# Patient Record
Sex: Male | Born: 1945
Health system: Southern US, Community
[De-identification: ages and names within clinical notes are randomized; demographics above are authoritative.]

## PROBLEM LIST (undated history)

## (undated) DIAGNOSIS — G4730 Sleep apnea, unspecified: Secondary | ICD-10-CM

## (undated) DIAGNOSIS — G40909 Epilepsy, unspecified, not intractable, without status epilepticus: Secondary | ICD-10-CM

## (undated) DIAGNOSIS — M25569 Pain in unspecified knee: Secondary | ICD-10-CM

## (undated) DIAGNOSIS — I10 Essential (primary) hypertension: Secondary | ICD-10-CM

## (undated) DIAGNOSIS — E7800 Pure hypercholesterolemia, unspecified: Secondary | ICD-10-CM

## (undated) DIAGNOSIS — I2510 Atherosclerotic heart disease of native coronary artery without angina pectoris: Secondary | ICD-10-CM

## (undated) DIAGNOSIS — M489 Spondylopathy, unspecified: Secondary | ICD-10-CM

## (undated) DIAGNOSIS — R55 Syncope and collapse: Secondary | ICD-10-CM

## (undated) HISTORY — DX: Pain in unspecified knee: M25.569

## (undated) HISTORY — DX: Spondylopathy, unspecified: M48.9

## (undated) HISTORY — DX: Pure hypercholesterolemia, unspecified: E78.00

## (undated) HISTORY — PX: SHOULDER ARTHROSCOPY: SHX128

## (undated) HISTORY — DX: Syncope and collapse: R55

## (undated) HISTORY — PX: HERNIA REPAIR: SHX51

## (undated) HISTORY — PX: COLONOSCOPY: SHX174

## (undated) HISTORY — DX: Essential (primary) hypertension: I10

## (undated) HISTORY — PX: OTHER SURGICAL HISTORY: SHX169

## (undated) HISTORY — DX: Epilepsy, unspecified, not intractable, without status epilepticus: G40.909

## (undated) HISTORY — PX: VASECTOMY: SHX75

## (undated) HISTORY — DX: Sleep apnea, unspecified: G47.30

## (undated) HISTORY — DX: Atherosclerotic heart disease of native coronary artery without angina pectoris: I25.10

---

## 1997-11-24 ENCOUNTER — Ambulatory Visit (HOSPITAL_COMMUNITY): Admission: RE | Admit: 1997-11-24 | Discharge: 1997-11-24 | Payer: Self-pay | Admitting: Neurosurgery

## 1997-11-24 ENCOUNTER — Encounter: Payer: Self-pay | Admitting: Neurosurgery

## 1999-03-13 ENCOUNTER — Other Ambulatory Visit: Admission: RE | Admit: 1999-03-13 | Discharge: 1999-03-13 | Payer: Self-pay | Admitting: Urology

## 1999-12-02 ENCOUNTER — Other Ambulatory Visit: Admission: RE | Admit: 1999-12-02 | Discharge: 1999-12-02 | Payer: Self-pay | Admitting: Urology

## 1999-12-14 ENCOUNTER — Other Ambulatory Visit: Admission: RE | Admit: 1999-12-14 | Discharge: 1999-12-14 | Payer: Self-pay | Admitting: Urology

## 2000-01-16 ENCOUNTER — Other Ambulatory Visit: Admission: RE | Admit: 2000-01-16 | Discharge: 2000-01-16 | Payer: Self-pay | Admitting: Urology

## 2000-02-19 ENCOUNTER — Other Ambulatory Visit: Admission: RE | Admit: 2000-02-19 | Discharge: 2000-02-19 | Payer: Self-pay | Admitting: Urology

## 2000-06-11 ENCOUNTER — Other Ambulatory Visit: Admission: RE | Admit: 2000-06-11 | Discharge: 2000-06-11 | Payer: Self-pay | Admitting: Urology

## 2000-12-07 ENCOUNTER — Other Ambulatory Visit: Admission: RE | Admit: 2000-12-07 | Discharge: 2000-12-07 | Payer: Self-pay | Admitting: Urology

## 2001-05-04 ENCOUNTER — Other Ambulatory Visit: Admission: RE | Admit: 2001-05-04 | Discharge: 2001-05-04 | Payer: Self-pay | Admitting: Urology

## 2001-10-08 ENCOUNTER — Encounter: Payer: Self-pay | Admitting: Internal Medicine

## 2001-11-09 ENCOUNTER — Encounter: Payer: Self-pay | Admitting: Internal Medicine

## 2001-11-16 ENCOUNTER — Other Ambulatory Visit: Admission: RE | Admit: 2001-11-16 | Discharge: 2001-11-16 | Payer: Self-pay | Admitting: Urology

## 2003-06-15 ENCOUNTER — Encounter: Admission: RE | Admit: 2003-06-15 | Discharge: 2003-06-15 | Payer: Self-pay | Admitting: Specialist

## 2003-07-04 ENCOUNTER — Encounter: Admission: RE | Admit: 2003-07-04 | Discharge: 2003-07-04 | Payer: Self-pay | Admitting: Specialist

## 2004-11-08 ENCOUNTER — Ambulatory Visit: Payer: Self-pay | Admitting: Internal Medicine

## 2004-11-22 ENCOUNTER — Ambulatory Visit: Payer: Self-pay | Admitting: Internal Medicine

## 2004-11-22 ENCOUNTER — Encounter (INDEPENDENT_AMBULATORY_CARE_PROVIDER_SITE_OTHER): Payer: Self-pay | Admitting: Specialist

## 2005-04-10 ENCOUNTER — Encounter: Admission: RE | Admit: 2005-04-10 | Discharge: 2005-04-10 | Payer: Self-pay | Admitting: Cardiovascular Disease

## 2005-04-15 ENCOUNTER — Ambulatory Visit (HOSPITAL_COMMUNITY): Admission: RE | Admit: 2005-04-15 | Discharge: 2005-04-16 | Payer: Self-pay | Admitting: Cardiovascular Disease

## 2005-05-09 ENCOUNTER — Ambulatory Visit (HOSPITAL_COMMUNITY): Admission: RE | Admit: 2005-05-09 | Discharge: 2005-05-09 | Payer: Self-pay | Admitting: Internal Medicine

## 2006-04-11 ENCOUNTER — Inpatient Hospital Stay (HOSPITAL_COMMUNITY): Admission: EM | Admit: 2006-04-11 | Discharge: 2006-04-14 | Payer: Self-pay | Admitting: Emergency Medicine

## 2006-04-17 ENCOUNTER — Inpatient Hospital Stay (HOSPITAL_COMMUNITY): Admission: EM | Admit: 2006-04-17 | Discharge: 2006-04-18 | Payer: Self-pay | Admitting: Emergency Medicine

## 2006-04-17 ENCOUNTER — Ambulatory Visit: Payer: Self-pay | Admitting: Vascular Surgery

## 2007-08-16 ENCOUNTER — Inpatient Hospital Stay (HOSPITAL_COMMUNITY): Admission: EM | Admit: 2007-08-16 | Discharge: 2007-08-17 | Payer: Self-pay | Admitting: Emergency Medicine

## 2009-06-19 ENCOUNTER — Encounter: Admission: RE | Admit: 2009-06-19 | Discharge: 2009-06-19 | Payer: Self-pay | Admitting: Neurosurgery

## 2009-07-19 ENCOUNTER — Encounter: Admission: RE | Admit: 2009-07-19 | Discharge: 2009-07-19 | Payer: Self-pay | Admitting: Orthopaedic Surgery

## 2009-10-08 ENCOUNTER — Encounter: Payer: Self-pay | Admitting: Internal Medicine

## 2009-10-15 ENCOUNTER — Encounter (INDEPENDENT_AMBULATORY_CARE_PROVIDER_SITE_OTHER): Payer: Self-pay | Admitting: *Deleted

## 2009-11-07 ENCOUNTER — Ambulatory Visit: Payer: Self-pay | Admitting: Cardiology

## 2009-11-26 ENCOUNTER — Ambulatory Visit: Payer: Self-pay | Admitting: Cardiology

## 2009-11-28 ENCOUNTER — Ambulatory Visit: Payer: Self-pay | Admitting: Internal Medicine

## 2009-11-28 DIAGNOSIS — Z860100 Personal history of colon polyps, unspecified: Secondary | ICD-10-CM | POA: Insufficient documentation

## 2009-11-30 ENCOUNTER — Encounter: Payer: Self-pay | Admitting: Internal Medicine

## 2009-12-12 ENCOUNTER — Ambulatory Visit: Payer: Self-pay | Admitting: Internal Medicine

## 2009-12-18 ENCOUNTER — Encounter: Payer: Self-pay | Admitting: Internal Medicine

## 2010-04-16 NOTE — Letter (Signed)
Summary: Anticoagulation/[facility]  Anticoagulation/[facility]   Imported By: Sherian Rein 12/04/2009 08:02:34  _____________________________________________________________________  External Attachment:    Type:   Image     Comment:   External Document

## 2010-04-16 NOTE — Procedures (Signed)
Summary: Colonoscopy  Patient: [name] Note: All result statuses are Final unless otherwise noted.  Tests: (1) Colonoscopy (COL)   COL Colonoscopy           DONE     [facility]     [address]     [place], [place]  [address]           COLONOSCOPY PROCEDURE REPORT           PATIENT:  Dustin, Ford  MR#:  [id number]     BIRTHDATE:  [birth year], 64 yrs. old  GENDER:  male     ENDOSCOPIST:  Wilhemina Bonito. Eda Keys, MD     REF. BY:  Surveillance Program Recall     PROCEDURE DATE:  12/12/2009     PROCEDURE:  Colonoscopy with snare polypectomy x 2     ASA CLASS:  Class II     INDICATIONS:  history of pre-cancerous (adenomatous) colon polyps,     surveillance and high-risk screening ; index 2003 w/ f/u 2006,     each w/ small adenomas     MEDICATIONS:   Fentanyl 75 mcg IV, Versed 9 mg IV           DESCRIPTION OF PROCEDURE:   After the risks benefits and     alternatives of the procedure were thoroughly explained, informed     consent was obtained.  Digital rectal exam was performed and     revealed no abnormalities.   The LB CF-[id number] endoscope     was introduced through the anus and advanced to the cecum, which     was identified by both the appendix and ileocecal valve, without     limitations.Time to cecum =4:11 min.  The quality of the prep was     excellent, using MoviPrep.  The instrument was then slowly     withdrawn (time = 13:32 min) as the colon was fully examined.     <<PROCEDUREIMAGES>>           FINDINGS:  A 5mm sessile polyp was found in the cecum. Polyp was     snared without cautery. Retrieval was successful.   A 6mm     depressed sessile polyp was found in the mid transverse colon.     Polyp was snared without cautery. Retrieval was successful.   Mild     diverticulosis was found in the sigmoid colon.   Retroflexed views     in the rectum revealed internal hemorrhoids.    The scope was then     withdrawn from the patient and the procedure completed.           COMPLICATIONS:  None     ENDOSCOPIC IMPRESSION:     1) Sessile polyp in the cecum - removed     2) Sessile polyp in the mid transverse colon - removed     3) Mild diverticulosis in the sigmoid colon     4) Internal hemorrhoids           RECOMMENDATIONS:     1) Follow up colonoscopy in 3 or  5 years pending pathology     results     2) Resume Plavix today           ______________________________     Wilhemina Bonito. Eda Keys, MD           CC:  Geoffry Paradise, MD; The Patient  n.     eSIGNED:   Wilhemina Bonito. Eda Keys at 12/12/2009 12:50 PM           Derrill Kay, [id number]  Note: An exclamation mark (!) indicates a result that was not dispersed into the flowsheet. Document Creation Date: 12/12/2009 12:51 PM _______________________________________________________________________  (1) Order result status: Final Collection or observation date-time: 12/12/2009 12:36 Requested date-time:  Receipt date-time:  Reported date-time:  Referring Physician:   Ordering Physician: Fransico Setters [phone]) Specimen Source:  Source: Launa Grill Order Number: [id number] Lab site:   Appended Document: Colonoscopy     Procedures Next Due Date:    Colonoscopy: 11/2014

## 2010-04-16 NOTE — Letter (Signed)
Summary: Anticoagulation Modification Letter  [facility]  [address] [place], [place] [address]   Phone: [phone]  Fax: [phone]        November 28, 2009  Re:    [name] [facility] DOB:    10-Dec-1945 MRN:    [id number]    Dear Peter Swaziland:  We have scheduled the above patient for a Colonoscopy procedure. Our records show that  he is on anticoagulation therapy. Please advise as to how long the patient may come off their therapy of Plavix prior to the scheduled procedure(s) on 12/12/09.   Please fax back/or route the completed form to Milford Cage, CMA at [phone].  Thank you for your help with this matter.  Sincerely,    Wilhemina Bonito. Marina Goodell, MD   Physician Recommendation:  Hold Plavix 5 days prior ________________  Stay on Aspirin   Other ______________________________

## 2010-04-16 NOTE — Assessment & Plan Note (Signed)
Summary: Surveillance colonoscopy/on Plavix   History of Present Illness Visit Type: new patient  Primary GI MD: Yancey Flemings MD Primary Provider: Minda Meo, MD  Requesting Provider: na Chief Complaint: Consult colon. Pt denies any GI complaints. Pt is on Plavix  History of Present Illness:   65 year old white male with hypertension, hyperlipidemia, seizure disorder, sleep apnea, and coronary artery disease status post coronary artery stent placement. He is followed in this office for history of adenomatous colon polyps and is now due for surveillance colonoscopy. Since his last visit he reports having had for coronary artery stents placed, most recently about 3 years ago. He has been on aspirin and Plavix since the initial stent placement. He recently saw his cardiologist, Dr. Swaziland, who suggested that may be safe to come off Plavix for short period of time while being maintained on aspirin for his procedure work. Patient has no active GI symptoms to report. His index colonoscopy was performed at 2003 with followup in September of 2006. Each time with small adenomas and mild diverticulosis.   GI Review of Systems      Denies abdominal pain, acid reflux, belching, bloating, chest pain, dysphagia with liquids, dysphagia with solids, heartburn, loss of appetite, nausea, vomiting, vomiting blood, weight loss, and  weight gain.        Denies anal fissure, black tarry stools, change in bowel habit, constipation, diarrhea, diverticulosis, fecal incontinence, heme positive stool, hemorrhoids, irritable bowel syndrome, jaundice, light color stool, liver problems, rectal bleeding, and  rectal pain.    Current Medications (verified): 1)  Tegretol 200 Mg Tabs (Carbamazepine) .... 5 By Mouth Once Daily 2)  Valtrex 500 Mg Tabs (Valacyclovir Hcl) .... 1/2 Tablet By Mouth Once Daily 3)  Aspirin 325 Mg Tabs (Aspirin) .Marland Kitchen.. 1 By Mouth Once Daily 4)  Lisinopril 40 Mg Tabs (Lisinopril) .... One Tablet By  Mouth Once Daily 5)  Plavix 75 Mg Tabs (Clopidogrel Bisulfate) .... One Tablet By Mouth Once Daily 6)  Metoprolol Succinate 25 Mg Xr24h-Tab (Metoprolol Succinate) .... One Tablet By Mouth Once Daily 7)  Amlodipine Besylate 5 Mg Tabs (Amlodipine Besylate) .... One Tablet By Mouth Once Daily 8)  Simvastatin 40 Mg Tabs (Simvastatin) .... One Every Other Day  Allergies (verified): No Known Drug Allergies  Past History:  Past Medical History: Adenomatous Colon Polyps Diverticulosis Seizures Hyperlipidemia Hemorrhoids Coronary Artery Disease Hypertension Sleep Apnea  Past Surgical History: Cyst Removal underarm vasectomy Hernia Surgery Angioplasty/Stent-- 2007 and 2008   Family History: No FH of Colon Cancer: Family History of Heart Disease: Father Hodgkins Lymphoma-Brother   Social History: Occupation: Retired Married Childern  Patient is a former smoker. --Quit 40 yrs ago  Alcohol Use - yes: 3-5 monthly  Daily Caffeine Use: 2 daily  Illicit Drug Use - no Smoking Status:  quit Drug Use:  no  Review of Systems       The patient complains of allergy/sinus.  The patient denies anemia, anxiety-new, arthritis/joint pain, back pain, blood in urine, breast changes/lumps, change in vision, confusion, cough, coughing up blood, depression-new, fainting, fatigue, fever, headaches-new, hearing problems, heart murmur, heart rhythm changes, itching, muscle pains/cramps, night sweats, nosebleeds, shortness of breath, skin rash, sleeping problems, sore throat, swelling of feet/legs, swollen lymph glands, thirst - excessive, urination - excessive, urination changes/pain, urine leakage, vision changes, and voice change.    Vital Signs:  Patient profile:   65 year old male Height:      73 inches Weight:  192 pounds BMI:     25.42 BSA:     2.12 Pulse rate:   64 / minute Pulse rhythm:   regular BP sitting:   124 / 80  (left arm) Cuff size:   regular  Vitals Entered By: Ok Anis CMA (November 28, 2009 9:20 AM)  Physical Exam  General:  Well developed, well nourished, no acute distress. Head:  Normocephalic and atraumatic. Eyes:  PERRLA, no icterus. Ears:  Normal auditory acuity. Nose:  No deformity, discharge,  or lesions. Mouth:  No deformity or lesions, dentition normal. Neck:  Supple; no masses or thyromegaly. Lungs:  Clear throughout to auscultation. Heart:  Regular rate and rhythm; no murmurs, rubs,  or bruits. Abdomen:  Soft, nontender and nondistended. No masses, hepatosplenomegaly or hernias noted. Normal bowel sounds. Rectal:  deferred until colonoscopy Msk:  Symmetrical with no gross deformities. Normal posture. Pulses:  Normal pulses noted. Extremities:  No , edema or deformities noted. Neurologic:  Alert and  oriented x4;. Skin:  Intact without significant lesions or rashes. Psych:  Alert and cooperative. Normal mood and affect.   Impression & Recommendations:  Problem # 1:  PERSONAL HISTORY OF COLONIC POLYPS (ICD-V27.52) 65 year old with multiple medical problems and history of adenomatous colon polyps who presents today regarding surveillance colonoscopy. His multiple medical problems are stable. He is high risk however due to his medical problems and the need to address Plavix therapy. We discussed in some detail today the advantages and disadvantages of staying on Plavix versus commoning off Plavix for a short period. I offered him both options with possible limitations discussed. He was interested in coming off Plavix, if okay with his cardiologist.  Plan: #1. Colonoscopy. The nature of the procedure as well as the risks, benefits, and alternatives were reviewed. He understood and agreed to proceed #2. Movi prep prescribed. The patient instructed on its use #3. Hold Plavix 5 days prior to the procedure while staying on aspirin. This is okayed with his cardiologist, Dr. Peter Swaziland. A letter in this regard has been faxed to Dr. Swaziland  or his review and approval. We will confirm his response with the patient  Problem # 2:  CAD (ICD-414.00) stable on aspirin and Plavix for prior intracoronary stents. As it relates to the procedure work, see discussion above  Other Orders: Colonoscopy (Colon)  Patient Instructions: 1)  Colon [facility] 12/12/09 11:00 am arrive at 10:00 am 2)  Movi prep instructions given to patient and Rx. sent to pharmacy. 3)  Colonoscopy and Flexible Sigmoidoscopy brochure given.  4)  Hold Plavix 5 days prior and letter sent to Dr. Swaziland for approval. 5)  Copy sent to : Minda Meo, MD  Peter Swaziland, MD 6)  The medication list was reviewed and reconciled.  All changed / newly prescribed medications were explained.  A complete medication list was provided to the patient / caregiver. Prescriptions: MOVIPREP 100 GM  SOLR (PEG-KCL-NACL-NASULF-NA ASC-C) As per prep instructions.  #1 x 0   Entered by:   Milford Cage NCMA   Authorized by:   Hilarie Fredrickson MD   Signed by:   Milford Cage NCMA on 11/28/2009   Method used:   Electronically to        [facility] [address] [phone]* (retail)       [address]       [place], [place]  [address]       Ph: [phone] or  [phone]       Fax: [phone]   RxID:   [id number]

## 2010-04-16 NOTE — Procedures (Signed)
Summary: Colonoscopy   Colonoscopy  Procedure date:  11/22/2004  Findings:      Location:  [facility].  Results: Diverticulosis.       Pathology:  Adenomatous polyp.          Procedures Next Due Date:    Colonoscopy: 11/2009 Patient Name: [name], [name]. MRN:  Procedure Procedures: Colonoscopy CPT: [phone].    with polypectomy. CPT: [id number].  Personnel: Endoscopist: Wilhemina Bonito. Marina Goodell, MD.  Exam Location: Exam performed in Outpatient Clinic. Outpatient  Patient Consent: Procedure, Alternatives, Risks and Benefits discussed, consent obtained, from patient. Consent was obtained by the RN.  Indications  Surveillance of: Adenomatous Polyp(s). This is an initial surveillance exam. Initial polypectomy was performed in 2003. in Jul. 1-2 Polyps were found at Index Exam. Largest polyp removed was 1 to 5 mm. Prior polyp located in distal colon. Pathology of worst  polyp: tubular adenoma.  History  Current Medications: Patient is not currently taking Coumadin.  Pre-Exam Physical: Performed Nov 22, 2004. Entire physical exam was normal.  Exam Exam: Extent of exam reached: Cecum, extent intended: Cecum.  The cecum was identified by appendiceal orifice and IC valve. Patient position: on left side. Colon retroflexion performed. Images taken. ASA Classification: II. Tolerance: excellent.  Monitoring: Pulse and BP monitoring, Oximetry used. Supplemental O2 given.  Colon Prep Used Miralax for colon prep. Prep results: excellent.  Sedation Meds: Patient assessed and found to be appropriate for moderate (conscious) sedation. Fentanyl 75 mcg. given IV. Versed 7 mg. given IV.  Findings POLYP: Descending Colon, Maximum size: 2 mm. sessile polyp. Procedure:  snare without cautery, removed, retrieved, Polyp sent to pathology. ICD9: Colon Polyps: 211.3.  NORMAL EXAM: Cecum to Rectum.  - DIVERTICULOSIS: Sigmoid Colon. ICD9: Diverticulosis, Colon: 562.10. Comments: mild.  POLYP:  Sigmoid Colon, Maximum size: 2 mm. sessile polyp. Procedure:  snare without cautery, removed, retrieved, sent to pathology. ICD9: Colon Polyps: 211.3.   Assessment Abnormal examination, see findings above.  Diagnoses: 211.3: Colon Polyps.  562.10: Diverticulosis, Colon.   Events  Unplanned Interventions: No intervention was required.  Unplanned Events: There were no complications. Plans Disposition: After procedure patient sent to recovery. After recovery patient sent home.  Scheduling/Referral: Colonoscopy, to Wilhemina Bonito. Marina Goodell, MD, in 5 years,    This report was created from the original endoscopy report, which was reviewed and signed by the above listed endoscopist.   cc:  Geoffry Paradise, MD      The Patient

## 2010-04-16 NOTE — Procedures (Signed)
Summary: Colonoscopy/[facility]  Colonoscopy/[facility]   Imported By: Sherian Rein 12/12/2009 15:19:07  _____________________________________________________________________  External Attachment:    Type:   Image     Comment:   External Document

## 2010-04-16 NOTE — Letter (Signed)
Summary: Patient Notice- Polyp Results  [name]  [address] [place], [place] [address]   Phone: [phone]  Fax: [phone]        December 18, 2009 MRN: [id number]    [name] [address] [place], [place]  [address]    Dear Mr. Sonoda,  I am pleased to inform you that the colon polyp(s) removed during your recent colonoscopy was (were) found to be benign (no cancer detected) upon pathologic examination.  I recommend you have a repeat colonoscopy examination in 5 years to look for recurrent polyps, as having colon polyps increases your risk for having recurrent polyps or even colon cancer in the future.  Should you develop new or worsening symptoms of abdominal pain, bowel habit changes or bleeding from the rectum or bowels, please schedule an evaluation with either your primary care physician or with me.  Additional information/recommendations:  __ No further action with Ford is needed at this time. Please      follow-up with your primary care physician for your other healthcare      needs.    Please call us if you are having persistent problems or have questions about your condition that have not been fully answered at this time.  Sincerely,  Hilarie Fredrickson MD  This letter has been electronically signed by your physician.  Appended Document: Patient Notice- Polyp Results letter mailed

## 2010-04-16 NOTE — Letter (Signed)
Summary: [facility] Instructions  [facility]  [address] [place], [place] [address]   Phone: [phone]  Fax: [phone]       [name]    [birth year]    MRN: [id number]        Procedure Day /Date:WEDNESDAY 12/12/09     Arrival Time:10:00 AM     Procedure Time:11:00 A.M.     Location of Procedure:                    X  [facility] (4th Floor)         PREPARATION FOR COLONOSCOPY WITH MOVIPREP   Starting 5 days prior to your procedure 12/07/09 do not eat nuts, seeds, popcorn, corn, beans, peas,  salads, or any raw vegetables.  Do not take any fiber supplements (e.g. Metamucil, Citrucel, and Benefiber).  THE DAY BEFORE YOUR PROCEDURE         DATE: 12/11/09 DAY: TUESDAY  1.  Drink clear liquids the entire day-NO SOLID FOOD  2.  Do not drink anything colored red or purple.  Avoid juices with pulp.  No orange juice.  3.  Drink at least 64 oz. (8 glasses) of fluid/clear liquids during the day to prevent dehydration and help the prep work efficiently.  CLEAR LIQUIDS INCLUDE: Water Jello Ice Popsicles Tea (sugar ok, no milk/cream) Powdered fruit flavored drinks Coffee (sugar ok, no milk/cream) Gatorade Juice: apple, white grape, white cranberry  Lemonade Clear bullion, consomm, broth Carbonated beverages (any kind) Strained chicken noodle soup Hard Candy                             4.  In the morning, mix first dose of MoviPrep solution:    Empty 1 Pouch A and 1 Pouch B into the disposable container    Add lukewarm drinking water to the top line of the container. Mix to dissolve    Refrigerate (mixed solution should be used within 24 hrs)  5.  Begin drinking the prep at 5:00 p.m. The MoviPrep container is divided by 4 marks.   Every 15 minutes drink the solution down to the next mark (approximately 8 oz) until the full liter is complete.   6.  Follow completed prep with 16 oz of clear liquid of your choice (Nothing red or purple).   Continue to drink clear liquids until bedtime.  7.  Before going to bed, mix second dose of MoviPrep solution:    Empty 1 Pouch A and 1 Pouch B into the disposable container    Add lukewarm drinking water to the top line of the container. Mix to dissolve    Refrigerate  THE DAY OF YOUR PROCEDURE      DATE: 12/12/09 DAY: WEDNESDAY  Beginning at 6:00 a.m. (5 hours before procedure):         1. Every 15 minutes, drink the solution down to the next mark (approx 8 oz) until the full liter is complete.  2. Follow completed prep with 16 oz. of clear liquid of your choice.    3. You may drink clear liquids until 9:00 AM(2 HOURS BEFORE PROCEDURE).   MEDICATION INSTRUCTIONS  Unless otherwise instructed, you should take regular prescription medications with a small sip of water   as early as possible the morning of your procedure.  Diabetic patients - see separate instructions.  Stop taking Plavix  12/07/09  (5 days before procedure).  WE WILL SEND DR. Swaziland A LETTER FOR APPROVAL AND CONTACT YOU WITH RESPONSE.         OTHER INSTRUCTIONS  You will need a responsible adult at least 66 years of age to accompany you and drive you home.   This person must remain in the waiting room during your procedure.  Wear loose fitting clothing that is easily removed.  Leave jewelry and other valuables at home.  However, you may wish to bring a book to read or  an iPod/MP3 player to listen to music as you wait for your procedure to start.  Remove all body piercing jewelry and leave at home.  Total time from sign-in until discharge is approximately 2-3 hours.  You should go home directly after your procedure and rest.  You can resume normal activities the  day after your procedure.  The day of your procedure you should not:   Drive   Make legal decisions   Operate machinery   Drink alcohol   Return to work  You will receive specific instructions about eating, activities and  medications before you leave.    The above instructions have been reviewed and explained to me by   _______________________    I fully understand and can verbalize these instructions _____________________________ Date _________

## 2010-04-16 NOTE — Progress Notes (Signed)
Summary: [facility]   Imported By: Sherian Rein 12/12/2009 15:17:57  _____________________________________________________________________  External Attachment:    Type:   Image     Comment:   External Document

## 2010-04-16 NOTE — Letter (Signed)
Summary: New Patient letter  [facility]  [address] [place], [place] [address]   Phone: [phone]  Fax: [phone]       10/15/2009 MRN: [id number]  [name] [address] [place], [place]  [address]  Dear [name],  Welcome to the Gastroenterology Division at [facility].    You are scheduled to see Dr.  Yancey Flemings on November 28, 2009 at 9:15am on the 3rd floor at [facility], [address]  We ask that you try to arrive at our office 15 minutes prior to your appointment time to allow for check-in.  We would like you to complete the enclosed self-administered evaluation form prior to your visit and bring it with you on the day of your appointment.  We will review it with you.  Also, please bring a complete list of all your medications or, if you prefer, bring the medication bottles and we will list them.  Please bring your insurance card so that we may make a copy of it.  If your insurance requires a referral to see a specialist, please bring your referral form from your primary care physician.  Co-payments are due at the time of your visit and may be paid by cash, check or credit card.     Your office visit will consist of a consult with your physician (includes a physical exam), any laboratory testing he/she may order, scheduling of any necessary diagnostic testing (e.g. x-ray, ultrasound, CT-scan), and scheduling of a procedure (e.g. Endoscopy, Colonoscopy) if required.  Please allow enough time on your schedule to allow for any/all of these possibilities.    If you cannot keep your appointment, please call [phone] to cancel or reschedule prior to your appointment date.  This allows [place] the opportunity to schedule an appointment for another patient in need of care.  If you do not cancel or reschedule by 5 p.m. the business day prior to your appointment date, you will be charged a $50.00 late cancellation/no-show fee.    Thank you for  choosing [facility] for your medical needs.  We appreciate the opportunity to care for you.  Please visit [place] at our website  to learn more about our practice.                     Sincerely,                                                             The Gastroenterology Division

## 2010-04-16 NOTE — Letter (Signed)
Summary: Colonoscopy Letter  [facility]  [address] [place], [place] [address]   Phone: [phone]  Fax: [phone]      October 08, 2009 MRN: [id number]   CARLON CHALOUX [address] [place], [place]  [address]   Dear Mr. Klem,   According to your medical record, it is time for you to schedule a Colonoscopy. The American Cancer Society recommends this procedure as a method to detect early colon cancer. Patients with a family history of colon cancer, or a personal history of colon polyps or inflammatory bowel disease are at increased risk.  This letter has been generated based on the recommendations made at the time of your procedure. If you feel that in your particular situation this may no longer apply, please contact our office.  Please call our office at [phone] to schedule this appointment or to update your records at your earliest convenience.  Thank you for cooperating with [place] to provide you with the very best care possible.   Sincerely,  Wilhemina Bonito. Marina Goodell, M.D.  [facility] Gastroenterology Division [phone]

## 2010-06-14 ENCOUNTER — Other Ambulatory Visit: Payer: Self-pay | Admitting: *Deleted

## 2010-06-14 ENCOUNTER — Telehealth: Payer: Self-pay | Admitting: Cardiology

## 2010-06-14 DIAGNOSIS — I2510 Atherosclerotic heart disease of native coronary artery without angina pectoris: Secondary | ICD-10-CM

## 2010-06-14 DIAGNOSIS — I10 Essential (primary) hypertension: Secondary | ICD-10-CM

## 2010-06-14 MED ORDER — LISINOPRIL 40 MG PO TABS: 40.0000 mg | ORAL_TABLET | Freq: Every day | ORAL | Status: DC

## 2010-06-14 MED ORDER — AMLODIPINE BESYLATE 5 MG PO TABS: 5.0000 mg | ORAL_TABLET | Freq: Every day | ORAL | Status: DC

## 2010-06-14 MED ORDER — CLOPIDOGREL BISULFATE 75 MG PO TABS: 75.0000 mg | ORAL_TABLET | Freq: Every day | ORAL | Status: DC

## 2010-06-14 NOTE — Telephone Encounter (Signed)
PT JUST WANTS ANITA TO KNOW JUST HAD LABS THIS MORNING AT [facility] IF SHE WANTS TO CALL AND GET THEM. PT SAID HE TOLD HER HER WOULD LET HER KNOW WHEN THEY HAD BEEN DONE. IF YOU NEED CHART, LET ME KNOW. NO CALL BACK TO PT NEEDED.

## 2010-06-14 NOTE — Telephone Encounter (Signed)
Called to let us know he had lab at [facility]. Will call to get results

## 2010-06-14 NOTE — Telephone Encounter (Signed)
escribe medication per fax request  

## 2010-07-02 ENCOUNTER — Encounter: Payer: Self-pay | Admitting: Cardiology

## 2010-07-02 DIAGNOSIS — I10 Essential (primary) hypertension: Secondary | ICD-10-CM | POA: Insufficient documentation

## 2010-07-02 DIAGNOSIS — G40909 Epilepsy, unspecified, not intractable, without status epilepticus: Secondary | ICD-10-CM | POA: Insufficient documentation

## 2010-07-02 DIAGNOSIS — E785 Hyperlipidemia, unspecified: Secondary | ICD-10-CM | POA: Insufficient documentation

## 2010-07-02 DIAGNOSIS — I2510 Atherosclerotic heart disease of native coronary artery without angina pectoris: Secondary | ICD-10-CM | POA: Insufficient documentation

## 2010-07-02 DIAGNOSIS — E7800 Pure hypercholesterolemia, unspecified: Secondary | ICD-10-CM | POA: Insufficient documentation

## 2010-07-04 ENCOUNTER — Ambulatory Visit (INDEPENDENT_AMBULATORY_CARE_PROVIDER_SITE_OTHER): Payer: Medicare Other | Admitting: Cardiology

## 2010-07-04 ENCOUNTER — Encounter: Payer: Self-pay | Admitting: Cardiology

## 2010-07-04 DIAGNOSIS — I2510 Atherosclerotic heart disease of native coronary artery without angina pectoris: Secondary | ICD-10-CM

## 2010-07-04 DIAGNOSIS — I10 Essential (primary) hypertension: Secondary | ICD-10-CM

## 2010-07-04 DIAGNOSIS — E7800 Pure hypercholesterolemia, unspecified: Secondary | ICD-10-CM

## 2010-07-04 NOTE — Patient Instructions (Signed)
Continue current medications.  Call if you notice a change in your chest pain symptoms.   Have your cholesterol rechecked 2 months after being on Lipitor.   I will see you back in 6 months.

## 2010-07-05 ENCOUNTER — Telehealth: Payer: Self-pay | Admitting: Cardiology

## 2010-07-05 NOTE — Telephone Encounter (Signed)
PATIENT HAVING A PROCEDURE AND WANTS TO TALK TO ANITA ABOUT HIS COUMADIN.

## 2010-07-05 NOTE — Assessment & Plan Note (Signed)
Blood pressure is well controlled on current medications. 

## 2010-07-05 NOTE — Progress Notes (Signed)
[name] Date of Birth: July 08, 1945   History of Present Illness: [name] is seen for followup today. He continues to have stable anginal symptoms. He does describe a burning or stinging in his mid chest when he is very active. This may involve walking on it treadmill at a high level or carrying weight. It resolves immediately with rest. It has not changed. In fact it is the same symptoms he had prior to his extensive coronary stents. Since his last visit he has been switched from simvastatin to pravastatin and more recently to Lipitor.  Current Outpatient Prescriptions on File Prior to Visit  Medication Sig Dispense Refill  . amLODipine (NORVASC) 5 MG tablet Take 1 tablet (5 mg total) by mouth daily.  90 tablet  3  . aspirin 81 MG tablet Take 81 mg by mouth daily.        Marland Kitchen atorvastatin (LIPITOR) 40 MG tablet Take 40 mg by mouth daily.        . carbamazepine (TEGRETOL) 200 MG tablet Take 200 mg by mouth daily. 5 TABLETS DAILY       . clopidogrel (PLAVIX) 75 MG tablet Take 1 tablet (75 mg total) by mouth daily.  90 tablet  3  . lisinopril (PRINIVIL,ZESTRIL) 40 MG tablet Take 1 tablet (40 mg total) by mouth daily.  90 tablet  3  . metoprolol (LOPRESSOR) 50 MG tablet Take 25 mg by mouth daily.        . valACYclovir (VALTREX) 500 MG tablet Take 500 mg by mouth daily.        . pravastatin (PRAVACHOL) 40 MG tablet Take 60 mg by mouth daily.          No Known Allergies  Past Medical History  Diagnosis Date  . Hypertension   . Coronary artery disease   . Hypercholesterolemia   . Seizure disorder     Past Surgical History  Procedure Date  . Hernia repair   . Shoulder arthroscopy     right    History  Smoking status  . Former Smoker  . Quit date: 07/03/1965  Smokeless tobacco  . Not on file    History  Alcohol Use No    Family History  Problem Relation Age of Onset  . Heart failure Father 69    Review of Systems: The review of systems is positive for chronic  anginal symptoms as noted. He denies any dyspnea.  All other systems were reviewed and are negative.  Physical Exam: BP 140/80  Pulse 60  Ht 6\' 1"  (1.854 m)  Wt 194 lb (87.998 kg)  BMI 25.60 kg/m2 The patient is alert and oriented x 3.  The mood and affect are normal.  The skin is warm and dry.  Color is normal.  The HEENT exam reveals that the sclera are nonicteric.  The mucous membranes are moist.  The carotids are 2+ without bruits.  There is no thyromegaly.  There is no JVD.  The lungs are clear.  The chest wall is non tender.  The heart exam reveals a regular rate with a normal S1 and S2.  There are no murmurs, gallops, or rubs.  The PMI is not displaced.   Abdominal exam reveals good bowel sounds.  There is no guarding or rebound.  There is no hepatosplenomegaly or tenderness.  There are no masses.  Exam of the legs reveal no clubbing, cyanosis, or edema.  The legs are without rashes.  The distal pulses are intact.  Cranial nerves II - XII are intact.  Motor and sensory functions are intact.  The gait is normal.  LABORATORY DATA:   Assessment / Plan:

## 2010-07-05 NOTE — Assessment & Plan Note (Signed)
We discussed that his goal LDL was 70. He has not been able to achieve this on simvastatin or pravastatin. He is currently on Lipitor 40 mg per day and will have followup lab work in 2 months.

## 2010-07-05 NOTE — Assessment & Plan Note (Signed)
Patient has chronic stable angina at high workloads. His last stress Cardiolite study in March of 2011 was normal. I feel that his ongoing symptoms are related to a small jailed diagonal branch. I recommended continuing on his current therapy. If he notes progressive anginal symptoms this would warrant cardiac catheterization.

## 2010-07-05 NOTE — Telephone Encounter (Signed)
Is having intraarticular injection and physician wants him to stop both Plavix and ASA 5 days prior. Is this OK?

## 2010-07-08 NOTE — Telephone Encounter (Signed)
yes

## 2010-07-08 NOTE — Telephone Encounter (Signed)
Can stop plavix before but should stay on ASA. What is he having injected?

## 2010-07-08 NOTE — Telephone Encounter (Signed)
Notified that Dr. [place] wants to increase Metoprolol tart to BID.

## 2010-07-08 NOTE — Telephone Encounter (Signed)
Is having shoulder injected. Says his physician may reject him stopping ASA. He will call them.  Also having problems with BP. Was changed in Jan to Metoprolol tart 25 mg daily instead of Succ due to cost. BP has been running 140-155/ And pulse in low to high 60's. Does he need to increase tart to BID?

## 2010-08-02 NOTE — Discharge Summary (Signed)
NAMETYLYN, DERWIN NO.:  [id number]   MEDICAL RECORD NO.:  [id number]          PATIENT TYPE:  OIB   LOCATION:  6525                         FACILITY:  [facility]   PHYSICIAN:  Eber Hong, P.A.DATE OF BIRTH:  Jul 15, 1945   DATE OF ADMISSION:  04/15/2005  DATE OF DISCHARGE:                                 DISCHARGE SUMMARY   Audio too short to transcribe (less than 5 seconds)      Eber Hong, P.A.     WDJ/MEDQ  D:  04/16/2005  T:  04/16/2005  Job:  [id number]

## 2010-08-02 NOTE — H&P (Signed)
[name], [name] NO.:  [id number]   MEDICAL RECORD NO.:  [id number]          PATIENT TYPE:  EMS   LOCATION:  MAJO                         FACILITY:  [facility]   PHYSICIAN:  [name], M.D.   DATE OF BIRTH:  September 25, 1945   DATE OF ADMISSION:  04/11/2006  DATE OF DISCHARGE:                              HISTORY & PHYSICAL   CHIEF COMPLAINTS:  Chest pain.   HISTORY OF PRESENT ILLNESS:  [name] is a 65 year old male followed by  Dr. Jacky Ford and Dr. Allyson Ford.  He was evaluated in December of 2006 for  chest pain.  He had an outpatient Cardiolite study that was essentially  normal.  He continued to have chest pain; and underwent diagnostic  catheterization as an outpatient.  This revealed a 70% RCA, 70% LAD, 70%  ramus intermedius, to 90% diagonal.  He underwent elective Cypher  stenting to the RCA and LAD.  This was in January of 2007.  He continued  to have some chest pain after this, mostly exertional discomfort, that  he controlled with rest.  This morning he had substernal chest pain  similar to what he has with exertion, but this was at rest.  He was  outside talking to a friend.  Symptoms lasted about 2 hours.  He denies  any associated nausea, vomiting, diaphoresis, radiation to his jaw or  arms.  He did not take nitroglycerin.  He called [place]; and is now seen in  the emergency room.  He still complains of some chest pain about 2/10.   PAST MEDICAL HISTORY:  Remarkable for arthritis in his cervical spine;  he is followed by Dr. Jeral Ford.  He also has history of seizure disorder.  He has treated hypertension, and dyslipidemia.  He sees Dr. Aldean Ford  and has a history of BPH.   CURRENT MEDICATIONS:  1. Tegretol 200 mg t.i.d. with 400 mg h.s.  2. Norvasc 5 mg a day.  3. Benicar 40 mg a day.  4. Plavix 75 mg a day.  5. Aspirin 81 mg a day.  6. Valtrex 500 mg a day.  7. Metoprolol 25 mg a day.  8. Zocor 40 mg a day.   ALLERGIES:  He has no known drug  allergies.   SOCIAL HISTORY:  He is married; he has 2 children.  He is a remote  smoker.   FAMILY HISTORY:  Remarkable for coronary disease, his father had his  first heart attack in his 52s.   REVIEW OF SYSTEMS:  Essentially unremarkable except for as noted above.  He does have some tingling in his left arm and face which he attributes  to his cervical spine disease.  He denies any GI bleeding or melena.  There is no history of diabetes.   PHYSICAL EXAM:  VITAL SIGNS:  Blood pressure 170/90, pulse 82,  temperature 98.2.  GENERAL:  He is a well-developed, well-nourished male in no acute  distress.  HEENT: Normocephalic.  Extraocular movements are intact.  Sclerae  nonicteric.  NECK:  Without bruit or JVD.  CHEST:  Clear to auscultation and  percussion.  CARDIAC EXAM:  Reveals regular rhythm without obvious murmur, rub, or  gallop.  Normal S1-S2.  ABDOMEN:  Nontender.  No hepatosplenomegaly.  EXTREMITIES:  Without edema.  He does have a soft right femoral artery  bruit; and good distal pulses.  NEUROLOGIC EXAMINATION:  Grossly intact.  He is awake, alert and  oriented, and cooperative.  He moves all extremities without obvious  deficit.  SKIN:  Warm and dry.   EKG:  Shows sinus rhythm with new T wave inversion in aVL, compared to  previous EKG.   IMPRESSION:  1. Unstable angina.  2. Coronary disease with RCA and LAD Cypher stenting after a normal      Cardiolite in January 2007.  3. Treated hypertension.  4. Treated dyslipidemia.  5. History of seizure disorder.  6. Cervical spine disease followed by Dr. Jeral Ford.  7. Benign prostatic hypertrophy followed by Dr. Aldean Ford.  8. Family history of coronary disease.   PLAN:  The patient will be admitted to telemetry.  He will be started on  IV heparin and nitrates.  I will also add a PPI.  He will probably need  to be restudied, Monday.      [name], P.A.      [name], M.D.  Electronically Signed     LKK/MEDQ  D:  04/11/2006  T:  04/11/2006  Job:  [id number]

## 2010-08-02 NOTE — Cardiovascular Report (Signed)
NAMEJARID, SASSO NO.:  [id number]   MEDICAL RECORD NO.:  [id number]          PATIENT TYPE:  OIB   LOCATION:  2899                         FACILITY:  [facility]   PHYSICIAN:  Nanetta Batty, M.D.   DATE OF BIRTH:  [birth year]   DATE OF PROCEDURE:  04/15/2005  DATE OF DISCHARGE:                              CARDIAC CATHETERIZATION   Mr. Joeseph is a 65 year old white male, patient of Dr. Lanell Matar referred for  progressive exertional angina.  He did have a negative Cardiolite a year  ago.  He also has cervical disease and is followed by Dr. Hilda Lias.  He was cathed at the [facility], April 09, 2005 revealing  long segmental distal RCA disease as well as fairly focal tandem lesions in  the apical LAD.  We have discussed the risks and benefits of bare metal  stenting versus drug eluding stenting based on the possibility of future  neurosurgical procedures.  However the ultimate decision was do pursue drug  eluding stenting.  This is agreed upon by both the patient myself and Dr.  Jeral Fruit.  He fully understands the implication of not being able to stop  Plavix because of risk of acute stent thrombosis.   The patient was brought to the second floor [facility] Cardiac cath lab in a  postabsorptive state.  He was premedicated with p.o. Valium.  His right  groin was prepped and shaved in usual sterile fashion.  One percent  Xylocaine was used for local anesthesia.  A 7-French sheath was inserted  into the right femoral artery using standard Seldinger technique.  A 7-  [place] JR-4 guide catheter along with a __________  soft wire and a 275 15  Maverick were used for initial PCI of the distal RCA.  The patient was on  aspirin and Plavix as an outpatient.  Received an additional 150 mg with  p.o. Plavix prior to intervention.  He received Angiomax bolus at an ACT of  325. __________ .  Aortic pressures monitored through the case.   Initial inflation was  performed at the distal RCA which revealed the lesion  to be compliant.  This is a large vessel and thus a 3523 Cypher drug eluding  stent was then deployed at 14 atmospheres and post dialysis 40-20 Quantam  Maverick up to 14-16 atmospheres.  Resulting reduction of 35% subsegmental  distal lesion at the genu of the large dominant right to 0% residual.  There  was some crimping of a high PDA lesion which was gelled by the stent,  however there was no obvious atherosclerosis at that point.  I suspect this  represents spasm.  Attempts to wire this through the stem strut was  unsuccessful due to the acuteness of the angle takeoff.  However I do not  think this will result in a problem in the future.   Attention was then focused on the LAD.  Using a 7-French JR JL-4 guide  catheter along with a __________  wire and a 2-5 tandem cutting balloon,  thin balloon arthrectomy was performed of the two  tandem distal LAD lesions  at nominal pressures.  Following this, two 513 Cypher drug eluding stents  were then deployed sequentially at 14-16 atmospheres resulting reduction of  80% fairly focal distal apical LAD lesions to 0% residual.  The patient  tolerated the procedure well and an electrocardiographic hemodynamics  __________   OVERALL IMPRESSION:  Successful distal RCA PCI stenting using large Cypher  drug eluding stent as well as apical tandem LAD PCI stenting using Cypher  drug eluding stent.  The patient tolerated the procedure well.  The  guidewire and catheter is removed.  The sheaths were sewn securely in place.  The patient left the lab in stable  condition.  Sheaths will be removed in 2 hours.  He will be treated with  aspirin and Plavix, hydrated overnight and discharged home in the morning.  I will see him back in the office in 1-2 weeks for follow up.   Dr. Jacky Kindle was notified of these results.      Nanetta Batty, M.D.  Electronically Signed     JB/MEDQ  D:  04/15/2005  T:   04/15/2005  Job:  [id number]   cc:   [facility]  [address]  [place], [place] [address]   Geoffry Paradise, M.D.  Fax: [phone]

## 2010-08-02 NOTE — Discharge Summary (Signed)
NAMEPETER, KEYWORTH NO.:  [id number]   MEDICAL RECORD NO.:  [id number]          PATIENT TYPE:  OIB   LOCATION:  6525                         FACILITY:  [facility]   PHYSICIAN:  [name], M.D.   DATE OF BIRTH:  September 28, 1945   DATE OF ADMISSION:  04/15/2005  DATE OF DISCHARGE:  04/16/2005                                 DISCHARGE SUMMARY   ADMISSION DIAGNOSES:  1.  Recurrent chest pain with nonischemic Cardiolite March 27, 2004.  2.  Cardiac catheterization, [facility], April 08, 2005 shows      70% right coronary artery, 60 to 70% ramus, 90% D1 and two 70% left      anterior descending lesions in the distal half.  3.  History of seizures.  4.  Cervical radiculopathy.   DISCHARGE DIAGNOSES:  1.  Recurrent chest pain with nonischemic Cardiolite March 27, 2005.  2.  Cardiac catheterization, [facility], April 08, 2005 shows      70% right coronary artery, 60 to 70% ramus, 90% D1 and two 70% left      anterior descending lesions in the distal half.  3.  History of seizures.  4.  Cervical radiculopathy.   PROCEDURES:  PTCA and stenting of the RCA with a 3.5 x 23 mm Cypher stent  and PTCA and stenting of tandem LAD lesions using two Cypher 2.5 x 13 mm  stents on April 15, 2005.   BRIEF HISTORY:  As noted above.  The patient is a 65 year old gentleman with  a positive family history for coronary artery disease, who presented with  chest discomfort.  He also had cervical and lumbar disk problems.  He  underwent Cardiolite on March 27, 2005, which was nonischemic, but he has  continued to have progressive symptoms and based on that he underwent  cardiac catheterization on April 08, 2005 with the above noted findings.  He was admitted electively for a PTCA and stenting today.   MEDICATIONS ON ADMISSION:  1.  Tegretol 200 mg t.i.d. plus two h.s. for 40 years.  2.  Foltx one daily.  3.  Benicar 40 mg daily.  4.  Aspirin 82 mg  daily.   ALLERGIES:  NONE.   For a further history and physical, please Dr. Hazle Coca dictated note.   HOSPITAL COURSE:  The patient was admitted and taken to the cath lab and  underwent the procedure as described above by Dr. Allyson Sabal.  He tolerated the  procedure well.  He was seen the following a.m. by Dr. Allyson Sabal, who felt that  he had some slight ecchymosis in his groin area, but without hematoma.  He  was doing well.  His labs were normal and he was ready for discharge.  Labs  after the procedure shows a white count of 7.7, a hemoglobin of 13.9, a  hematocrit of 40.5, platelet count is 192,000.  Electrolytes are normal, BUN  is 10 and creatinine is 1.1.  CK-MBs were negative.   DISCHARGE MEDICATIONS:  He will continue his home meds, which include:  1.  Vytorin 10/20 one  daily.  2.  Tegretol 200 mg t.i.d. plus two tablets h.s.  3.  Valtrex 500 mg daily.  4.  Benicar 40 mg daily.  5.  Plavix 75 mg daily.  6.  He will continue aspirin at the increased dose of 325 mg daily.   He will return to see Dr. Allyson Sabal in two weeks.   CONDITION AT DISCHARGE:  Improved.      Eber Hong, P.A.      [name], M.D.  Electronically Signed    WDJ/MEDQ  D:  04/16/2005  T:  04/16/2005  Job:  [id number]   cc:   Geoffry Paradise, M.D.  Fax: [phone]

## 2010-08-02 NOTE — Cardiovascular Report (Signed)
NAMESABEN, DONIGAN NO.:  [id number]   MEDICAL RECORD NO.:  [id number]          PATIENT TYPE:  INP   LOCATION:  2807                         FACILITY:  [facility]   PHYSICIAN:  Madaline Savage, M.D.DATE OF BIRTH:  1945/04/24   DATE OF PROCEDURE:  04/13/2006  DATE OF DISCHARGE:                            CARDIAC CATHETERIZATION   PROCEDURES PERFORMED:  1. Selective coronary angiography by Judkins technique.  2. Retrograde left heart catheterization.  3. Left ventricular angiography.  4. Direct intracoronary artery stenting of the mid left anterior      descending coronary artery.   COMPLICATIONS:  None.   ENTRY SITE:  Right femoral.   DYE USED:  Omnipaque.   PROCEDURAL COMPLICATIONS:  None.  The patient did have some mild oozing  from the right femoral artery during the procedure which led to  placement of an AngioSeal which was successful.  There was trivial  ecchymosis at the end of the case and no hematoma noted.   PATIENT PROFILE:  The patient is a delightful 65 year old white married  gentleman who is a cardiology patient of Dr. Nanetta Batty who  presented to [facility] 04/11/2006 with a history of chest  pain.  He had previously had chest pain in December 2006 and had an  outpatient cath showing a 70% right coronary artery, a 70% LAD stenosis,  a 70% ramus intermediate branch and a 90% diagonal branch.  He then  underwent elective Cypher stenting to the RCA and LAD at two sites by  Dr. Allyson Sabal.  The patient's EKG during this admission has failed to show  any evidence of ischemia, but there was an old anteroseptal MI noted,  but without superimposed ischemia.  During this hospitalization he also  had lab work showing negative cardiac enzymes and a negative troponin.  Today's procedure went well without any major problems.   RESULTS:  Pressures:  The left ventricular pressure was 138/3.  End-  diastolic pressure 16.  Central aortic pressure  138/70.  Mean of 100.  No aortic valve gradient by pullback technique.   ANGIOGRAPHIC RESULTS:  The patient had a normal left main and a normal  left circumflex coronary artery.  The circumflex was small.  The LAD was  a long medium size vessel coursing all the way to the cardiac apex and  then wrapping around the cardiac apex and supplying a third of the  inferior wall.  One to two diagonal branches arose and I saw at least  five septal perforator branches.  There was a radio-opaque stent noted  in the midportion of the vessel just after the first of the two stents  was placed just at the septal perforator branch #3 and beyond diagonal  branch #2 and the last stent was placed just above septal perforator  branch #4 and was the distal most of the two stents.  Between the two  stents was a dynamic area of mild bridging as well as an inflow tract  stenosis into the more distal of the two LAD stents.  It was about 75%  obstructive  and there was some haziness.  There was TIMI III distal  flow.   Right coronary artery was a large and dominant vessel which contained a  radio-opaque stent in the midportion of the vessel just after acute  marginal branch #1 and just before acute marginal branch number #2.  The  stent looked pristine in its appearance with no evidence of any  renarrowing.  Just prior to the stent was an eccentric luminal  irregularity of 20-30%.  The distal vessel of this dominant right  coronary artery was widely patent and showed TIMI III distal flow.   The left ventricle showed excellent contractility of all wall segments  with ejection fraction noted to be approximately 55-60%.  No mitral  regurgitation was seen and there was no evidence of LV thrombus.   This percutaneous coronary procedure was performed with 6-French guiding  catheters which included a Cordis FL-4 guide.  The wire used was a Tax adviser which allowed the ability to measure the distance between the two   LAD stents and a Cypher stent was directly deployed between Cypher stent  #1 and #2 making sure that the edges overlapped both of the previous  stents.  After deploying the third stent between Cypher stent #1 and  Cypher #2 from years ago, it was deployed to approximately 12-14  atmospheres and then postdilated with a Quantum Maverick balloon which  was 275 x 20 and I took that balloon up to 18 atmospheres of pressure.  The result was a very straight area of three stents, the newest stent of  which overlapped the previous two.  There was TIMI III flow and  absolutely no residual stenosis.   The procedure was tolerated well with minimal pain.  There were no  rhythm or blood pressure complications.  The mild bleeding that occurred  due to Angiomax was contained by applying pressure during the case and  then a successful AngioSeal at the end.  The patient was taken from the  cath lab table to the holding area with stable vital signs and no active  problems.   FINAL IMPRESSION:  1. Coronary artery disease status post one stent to the right coronary      artery and two stents to the left anterior descending coronary      artery remotely.  2. Recent unstable angina.  3. Normal left ventricular systolic function.  4. Inflow tract stenosis in mid left anterior descending coronary      artery just prior to the inflow of the distal most stent in the      left anterior descending coronary artery.  5. Successful direct stenting of the mid left anterior descending      coronary artery between patent Cypher stents #1 and stenosed Cypher      stent #2.   PLAN:  The patient has had his Angiomax discontinued.  He will be  observed overnight.  He will be ambulated in 2 hours status post his  AngioSeal placement and will be a candidate for discharge tomorrow.           ______________________________  Madaline Savage, M.D.    WHG/MEDQ  D:  04/13/2006  T:  04/13/2006  Job:  [id number]   cc:    Nanetta Batty, M.D.

## 2010-08-02 NOTE — Discharge Summary (Signed)
[name], [name] NO.:  [id number]   MEDICAL RECORD NO.:  [id number]          PATIENT TYPE:  INP   LOCATION:  6523                         FACILITY:  [facility]   PHYSICIAN:  Madaline Savage, M.D.DATE OF BIRTH:  1945/11/29   DATE OF ADMISSION:  04/11/2006  DATE OF DISCHARGE:  04/14/2006                               DISCHARGE SUMMARY   DISCHARGE DIAGNOSES:  1. Unstable angina, status post new left anterior descending Cypher      this admission.  2. Coronary disease, right coronary artery and left anterior      descending Cypher stenting after a normal Cardiolite, January 2007.  3. Treated hypertension.  4. Treated dyslipidemia.  5. History of seizure disorder.  6. History of cervical spine disease.  7. Benign prostatic hypertrophy.   HOSPITAL COURSE:  The patient is a 65 year old male followed by Dr.  Allyson Sabal and Dr. Jacky Kindle with a history of coronary disease.  He had chest  pain in December 2006 and had a Cardiolite study that was negative for  ischemia.  He continued to have chest pain and had an outpatient cath  that showed a 70% RCA and 70% LAD, 70% ramus intermedius and a 90%  diagonal.  He underwent elective Cypher stenting to the RCA and LAD; 2  sites were done in the LAD.  Since then, he had some exertional chest  pain that is relieved with rest.  On the morning of admission, he had  chest pain at rest that lasted about 2 hours.  It was similar to his  previous anginal symptoms.  He had no associated nausea, vomiting or  shortness of breath or radiation.  He went to the emergency room on  April 11, 2006 and was admitted for further evaluation.  His EKG shows  sinus rhythm; he did have new T wave inversion in aVL.  He was started  on heparin.  Troponins were negative.  Catheterization was done April 13, 2006 by Dr. Elsie Lincoln.  The previously placed RCA and LAD stents were  patent.  There was a new stenosis in the LAD between the 2 previously  placed  stents and this site received a Cypher stent.  The patient  tolerated this well and we feel he can be discharged April 14, 2006.   DISCHARGE MEDICATIONS:  1. Coated aspirin once a day.  2. Plavix 75 mg a day.  3. Tegretol 200 mg t.i.d. with 400 mg at h.s.  4. Norvasc 5 mg a day.  5. Benicar 40 mg a day.  6. Valtrex 500 mg a day.  7. Metoprolol 25 mg a day.  8. Zocor 40 mg a day.  9. Nitroglycerin sublingual p.r.n.   LABORATORY DATA:  White count is 5.7, hemoglobin 13.4, hematocrit 39.2,  platelets 203,000.  Sodium 136, potassium 4.1, BUN 12, creatinine 1.3.  INR is 1.2.   Chest x-ray shows no active disease.   TSH is 1.36.  Liver functions are normal.   DISPOSITION:  The patient is discharged in stable condition.  The  patient has requested followup with Dr. Elsie Lincoln  in the office and this  will be arranged.      Abelino Derrick, P.A.    ______________________________  Madaline Savage, M.D.    Lenard Lance  D:  04/14/2006  T:  04/14/2006  Job:  [id number]

## 2010-08-02 NOTE — Discharge Summary (Signed)
NAMEFATE, CASTER NO.:  [id number]   MEDICAL RECORD NO.:  [id number]          PATIENT TYPE:  INP   LOCATION:  3737                         FACILITY:  [facility]   PHYSICIAN:  Nanetta Batty, M.D.   DATE OF BIRTH:  Jul 30, 1945   DATE OF ADMISSION:  04/17/2006  DATE OF DISCHARGE:  04/18/2006                               DISCHARGE SUMMARY   DISCHARGE DIAGNOSES:  1. Chest pain, negative myocardial infarction, more shoulder pain.  2. Knee pain.  3. Vagal reactions.   DISCHARGE CONDITION:  Stable, improved.   DISCHARGE MEDICINES:  1. Darvocet-N 100 one or two every 6 hours as needed for knee pain.  2. Aspirin daily.  3. Plavix 75 mg daily.  4. Tegretol 200 mg 3 times a day and 400 mg at bedtime.  5. Hold Norvasc.  6. Benicar 20 mg daily.  7. Metoprolol XL 25 mg daily.  8. Zocor 40 mg every evening.  9. Valtrex 500 mg daily.  10.Nitroglycerin sublingual as needed for chest pain.   DISCHARGE INSTRUCTIONS:  1. Activity:  No restrictions.  2. Low-sodium, heart-healthy diet.  3. Keep knee iced; see orthopedics Monday if no improvement.  4. Stop taking Norvasc.   HISTORY OF PRESENT ILLNESS:  A 65 year old white married male presented  today to the emergency room with a crick in his neck yesterday  settling in his shoulder and left anterior chest.  This morning, he went  to shave, was dizzy after getting up, and he developed a heavy sweat.  He came to the emergency room to be evaluated.  His shoulder and neck  pain increased with movement.  He also has a history of cervical disk  disease.   EMS stated blood pressures were virtually in the 80s.   PAST MEDICAL HISTORY:  1. Coronary disease with new Cypher stent placed in the mid LAD      April 13, 2006.  EF was 60%.  History of RCA stent 2007, had been      patent in January and previous LAD stent.  2. History of seizure disorder.  3. History of cervical spine disease.  4. History of dyslipidemia.  5.  Treated hypertension.  6. BPH.  7. History of cervical disk disease.   OUTPATIENT MEDICATIONS:  1. Aspirin.  2. Plavix 75.  3. Tegretol 200 t.i.d. with 400 at bedtime.  4. Norvasc 5.  5. Benicar 40.  6. Valtrex 500.  7. Metoprolol 25 daily.  8. Zocor 40.  9. Nitroglycerin sublingual.   FAMILY HISTORY, SOCIAL HISTORY, REVIEW OF SYSTEMS:  See H&P.   PHYSICAL EXAMINATION ON DISCHARGE:  Blood pressure 137/79, pulse 71,  respiratory rate 20, temperature 99.2.  Oxygen saturation 97%.  Alert  and oriented male in no acute distress.  LUNGS:  Were clear.  HEART:  Sounds regular rate and rhythm.  No further chest pain.   LABORATORY DATA:  Hemoglobin 13.1, hematocrit 39.1, WBC 5.4, platelets  204, neutrophils 71, lymphs 17, monocytes 10, eosinophils 2, basophils  0.  These remained stable.  Pro time 15.1, INR of 1.2, PTT 34.  Sodium initially 138, potassium 4.6, chloride 94, CO2 26, glucose 111,  BUN 15, creatinine 0.98.  Sodium improved at discharge; he was 131 with  potassium of 4, chloride 97, CO2 28, glucose 105, BUN 13, creatinine  0.86, total protein 6.3, albumin 3.7, AST 20, ALT 24, ALP 59, total  bilirubin 0.5, magnesium 2.2.   CK 7490, MBs negative at 1.0 and 1.3.  Troponin I negative 0.04 x2.  Knee had small patella spurs on two-view of his left knee.  EKG:  Sinus  rhythm, left axis deviation; that was the January one; February 2, sinus  rhythm, left axis deviation, no significant change from previous  tracing; on February 1, left anterior fascicular block, septal infarct,  no significant changes.  Doppler study of right groin:  No evidence of  seizure aneurysm.   HOSPITAL COURSE:  Mr. Brightwell was brought in overnight after vasovagal-  type symptoms at home with chest and shoulder discomfort.  He was  negative for MI.  He was stable, and by the next morning, he was felt  ready for discharge home.  He recently had stents placed in January  2008.  Dr. Elsie Lincoln saw him and  discharged him.  He would follow up with  orthopedics if his left knee keeps bothering him.     Darcella Gasman. Annie Paras, N.P.      Nanetta Batty, M.D.  Electronically Signed   LRI/MEDQ  D:  06/04/2006  T:  06/04/2006  Job:  [id number]   cc:   Nanetta Batty, M.D.

## 2010-08-02 NOTE — Discharge Summary (Signed)
NAMEQUITMAN, Dustin NO.:  [id number]   MEDICAL RECORD NO.:  [id number]          PATIENT TYPE:  INP   LOCATION:  2040                         FACILITY:  [facility]   PHYSICIAN:  Cristy Hilts. Jacinto Halim, MD       DATE OF BIRTH:  [birth year]   DATE OF ADMISSION:  08/16/2007  DATE OF DISCHARGE:  08/17/2007                               DISCHARGE SUMMARY   [name] is a 65 year old white married male with prior history of  coronary artery disease with three overlapping stents to his LAD, last  placed April 13, 2006.  He also has a history of a stent to his RCA.  He presented to the emergency room with chest pain and it was decided,  he should be admitted for further evaluation.  Point-of-care markers  were negative.  In the emergency room, it was decided that he should  undergo cardiac catheterization secondary to his continued angina.  Cath  revealed his stents were patent.  This was performed by Dr. Chanda Busing, on August 16, 2007.  On August 17, 2007, he was chest pain free.  Ranexa was added.   Blood pressure was 93/59, pulse was 52, and respirations were 18.   LABORATORY DATA:  Hemoglobin was 13.3, hematocrit 39.3, platelets 210,  and WBCs 5.2.  Sodium was 134, potassium 3.9, BUN 17, and creatinine  1.07.  CK-MBs and troponins were negative x2.  Total cholesterol was  141, triglycerides 63, HDL was 54, and LDL was 74.  Tegretol level was  11.3.   DISCHARGE MEDICATIONS:  1. Tegretol 200 mg 5 times a day.  2. Valtrex 500 mg daily.  3. Aspirin 81 mg a day.  4. Plavix 75 mg a day.  5. Metoprolol succinate 25 mg a day.  6. Crestor 10 mg daily at bedtime.  7. Lisinopril 40 mg a day.  8. He can use nitroglycerin as needed for chest pain.  9. He should stop his lisinopril/hydrochlorothiazide.   He should do no lifting, pushing, pulling x1 week and no driving today.  If he has any problems with his groin, he should call our office.  He  has an appointment to see Dr. Elsie Lincoln on  September 09, 2007, at 12 noon.   DISCHARGE DIAGNOSES:  1. Unstable angina.  2. Coronary artery disease.  No progression of disease, status post      catheterization with pain since.  3. Hypertension.  4. Hyperlipidemia.  5. History of seizure disorder.  6. Normal left ventricular function.      [name], N.P.       Cristy Hilts. Jacinto Halim, MD  Electronically Signed    BB/MEDQ  D:  10/08/2007  T:  10/09/2007  Job:  [id number]   cc:   Geoffry Paradise, MD

## 2010-08-13 ENCOUNTER — Telehealth: Payer: Self-pay | Admitting: Cardiology

## 2010-08-13 NOTE — Telephone Encounter (Signed)
Pt called he wants to go over the meds he is taking. His primary Doctor is wanting to change a med please call after 1130

## 2010-08-13 NOTE — Telephone Encounter (Signed)
Called to let us know Dr. Jacky Kindle wanted to change medication to Bystolic 10 mg x 2 wks and then recheck BP. He hasn't started yet because he has been on vacation. Dr. Jacky Kindle stopped Amlodipine and Metoprolol tart as a trial. He was to let them know if any change in BP.  Will advise Dr. Swaziland.

## 2010-08-27 ENCOUNTER — Telehealth: Payer: Self-pay | Admitting: Cardiology

## 2010-08-27 NOTE — Telephone Encounter (Signed)
Called stating he saw NP Fannie Knee) at Dr. Lanell Matar office today to recheck BP. Not much change in BP readings. Today was 166/70's. At home has been 120-140/60-70's. Still taking Bystolic 10 mg and Fannie Knee added back Amlodipine 5 mg. They will recheck BP in 2 wks.

## 2010-08-27 NOTE — Telephone Encounter (Signed)
Called regarding the recent blood pressure testing results. Please call back.

## 2010-10-01 ENCOUNTER — Encounter: Payer: Self-pay | Admitting: Cardiology

## 2010-10-01 ENCOUNTER — Telehealth: Payer: Self-pay | Admitting: Cardiology

## 2010-10-01 NOTE — Telephone Encounter (Signed)
Called to let us know that he is now taking Lipitor 40 mg for chol; and Bystolic 10 mg and Amlodipine 5 mg for BP control.  BP has been 120-130/ range. Feels good.

## 2010-10-01 NOTE — Telephone Encounter (Signed)
Has some questions regarding his medications ° °

## 2010-10-07 ENCOUNTER — Telehealth: Payer: Self-pay | Admitting: Cardiology

## 2010-10-07 NOTE — Telephone Encounter (Signed)
Called wanting to know if OK to use gen Plavix. Advised that would be fine.

## 2010-10-07 NOTE — Telephone Encounter (Signed)
Lm

## 2010-10-07 NOTE — Telephone Encounter (Signed)
Called because something came up with the Pharmacy filling his plavix prescription and he wanted to get your opinion on it before proceeding with what they want him to do. For the first time they were going to give him the generic and he wanted to make sure that was ok. Please call back. I have pulled the chart.

## 2010-11-12 ENCOUNTER — Other Ambulatory Visit: Payer: Self-pay | Admitting: Dermatology

## 2010-11-19 ENCOUNTER — Telehealth: Payer: Self-pay | Admitting: Cardiology

## 2010-11-19 NOTE — Telephone Encounter (Signed)
Called wanting to know what "my chart". Lm that not sure why he received an email. The service is for future use and has not been activated yet.

## 2010-11-19 NOTE — Telephone Encounter (Signed)
Pt has questions about "My Chart" email he received that has Dr. Elvis Coil name on it.  Pt aware IT questions may not be able to be answered.  Chart in box.

## 2010-12-12 LAB — DIFFERENTIAL
Basophils Absolute: 0
Basophils Relative: 0
Eosinophils Absolute: 0
Eosinophils Absolute: 0.1
Eosinophils Relative: 1
Lymphocytes Relative: 19
Lymphocytes Relative: 25
Lymphs Abs: 1.3
Monocytes Absolute: 0.5
Monocytes Relative: 10
Neutro Abs: 3.3
Neutro Abs: 3.3
Neutrophils Relative %: 64

## 2010-12-12 LAB — CBC
HCT: 38.4 — ABNORMAL LOW
MCHC: 34.5
Platelets: 208
Platelets: 210
RBC: 4.25
RDW: 13.4

## 2010-12-12 LAB — CARDIAC PANEL(CRET KIN+CKTOT+MB+TROPI)
Relative Index: 1.1
Relative Index: 1.3
Total CK: 133
Troponin I: 0.01

## 2010-12-12 LAB — COMPREHENSIVE METABOLIC PANEL
ALT: 18
Albumin: 3.8
Albumin: 4.1
Alkaline Phosphatase: 56
Alkaline Phosphatase: 58
BUN: 17
CO2: 25
GFR calc Af Amer: >60
GFR calc Af Amer: >60
Glucose, Bld: 107 — ABNORMAL HIGH
Glucose, Bld: 99
Potassium: 3.9
Sodium: 135
Total Bilirubin: 0.6
Total Protein: 6.3

## 2010-12-12 LAB — PROTIME-INR
INR: 1.2
Prothrombin Time: 14.7

## 2010-12-12 LAB — LIPID PANEL
Cholesterol: 141
LDL Cholesterol: 74
Total CHOL/HDL Ratio: 2.6

## 2010-12-12 LAB — POCT CARDIAC MARKERS: Troponin i, poc: <0.05

## 2010-12-31 ENCOUNTER — Ambulatory Visit (INDEPENDENT_AMBULATORY_CARE_PROVIDER_SITE_OTHER): Payer: Medicare Other | Admitting: Cardiology

## 2010-12-31 ENCOUNTER — Encounter: Payer: Self-pay | Admitting: Cardiology

## 2010-12-31 VITALS — BP 154/76 | HR 68 | Ht 73.0 in | Wt 191.1 lb

## 2010-12-31 DIAGNOSIS — I2510 Atherosclerotic heart disease of native coronary artery without angina pectoris: Secondary | ICD-10-CM

## 2010-12-31 DIAGNOSIS — I10 Essential (primary) hypertension: Secondary | ICD-10-CM

## 2010-12-31 DIAGNOSIS — E7800 Pure hypercholesterolemia, unspecified: Secondary | ICD-10-CM

## 2010-12-31 NOTE — Assessment & Plan Note (Signed)
His most recent lipid parameters on 09/20/2010 included total cholesterol of 179, HDL 69, LDL 100, and triglycerides of 69. He is tolerating the Lipitor fairly well and will continue to work on his diet.

## 2010-12-31 NOTE — Assessment & Plan Note (Signed)
His blood pressure is elevated today. I asked that he monitor his blood pressure and report if it is staying above 135/85. If so I would increase his bystolic dose.

## 2010-12-31 NOTE — Progress Notes (Signed)
[name] Date of Birth: 03/31/1945   History of Present Illness: [name] is seen for followup today. He has a history of coronary disease and is status post extensive stenting of the mid to distal LAD with 3 Cypher stents. He also had stenting of the mid right coronary. He does have a diagonal branch that was jailed. He continues to have stable anginal symptoms. He does describe a burning or stinging in his mid chest when he is very active. This may involve walking on it treadmill at a high level or carrying weight. It resolves  with rest. It has not changed. In fact it is the same symptoms he had prior to his extensive coronary stents. Since his last visit he has been switched  to Lipitor. He is tolerating this well. He was also switched from metoprolol to bystolic for blood pressure.  Current Outpatient Prescriptions on File Prior to Visit  Medication Sig Dispense Refill  . amLODipine (NORVASC) 5 MG tablet Take 1 tablet (5 mg total) by mouth daily.  90 tablet  3  . aspirin 81 MG tablet Take 81 mg by mouth daily.        Marland Kitchen atorvastatin (LIPITOR) 40 MG tablet Take 40 mg by mouth daily.        . carbamazepine (TEGRETOL) 200 MG tablet Take 200 mg by mouth daily. 5 TABLETS DAILY       . clopidogrel (PLAVIX) 75 MG tablet Take 1 tablet (75 mg total) by mouth daily.  90 tablet  3  . lisinopril (PRINIVIL,ZESTRIL) 40 MG tablet Take 1 tablet (40 mg total) by mouth daily.  90 tablet  3  . nebivolol (BYSTOLIC) 10 MG tablet Take 10 mg by mouth daily.        . valACYclovir (VALTREX) 500 MG tablet Take 500 mg by mouth daily.          No Known Allergies  Past Medical History  Diagnosis Date  . Hypertension   . Coronary artery disease   . Hypercholesterolemia   . Seizure disorder   . Knee pain   . Vagal reaction   . Seizure disorder     History of seizure disorder  . Cervical spine disease     History of cervical spine disease    Past Surgical History  Procedure Date  . Hernia repair     . Shoulder arthroscopy     right    History  Smoking status  . Former Smoker  . Quit date: 07/03/1965  Smokeless tobacco  . Not on file    History  Alcohol Use No    Family History  Problem Relation Age of Onset  . Heart failure Father 22  . Hyperlipidemia Mother     Review of Systems: The review of systems is positive for chronic anginal symptoms as noted. He denies any dyspnea.  All other systems were reviewed and are negative.  Physical Exam: BP 154/76  Pulse 68  Ht 6\' 1"  (1.854 m)  Wt 191 lb 1.9 oz (86.691 kg)  BMI 25.22 kg/m2 The patient is alert and oriented x 3.  The mood and affect are normal.  The skin is warm and dry.  Color is normal.  The HEENT exam reveals that the sclera are nonicteric.  The mucous membranes are moist.  The carotids are 2+ without bruits.  There is no thyromegaly.  There is no JVD.  The lungs are clear.  The chest wall is non tender.  The  heart exam reveals a regular rate with a normal S1 and S2.  There are no murmurs, gallops, or rubs.  The PMI is not displaced.   Abdominal exam reveals good bowel sounds.  There is no guarding or rebound.  There is no hepatosplenomegaly or tenderness.  There are no masses.  Exam of the legs reveal no clubbing, cyanosis, or edema.  The legs are without rashes.  The distal pulses are intact.  Cranial nerves II - XII are intact.  Motor and sensory functions are intact.  The gait is normal.  LABORATORY DATA: ECG today demonstrates normal sinus rhythm with left anterior fascicular block and LVH by voltage. Cannot rule out old septal infarct. This is unchanged from March of 2010.  Assessment / Plan:

## 2010-12-31 NOTE — Patient Instructions (Addendum)
Stay on your current medications.  We will schedule you for a nuclear stress test   Monitor your blood pressure at home. We would like to see the top number less than 135 and the lower number less than 85.  Your physician has requested that you have en exercise stress myoview. For further information please visit [email or web address] Please follow instruction sheet, as given.

## 2010-12-31 NOTE — Assessment & Plan Note (Signed)
He has chronic stable anginal symptoms. I suspect this is related to his jailed diagonal branch or to microvascular disease. He has been intolerant of Ranexa in the past. Nitrates cause him to have severe headaches. We will continue with beta blockers and calcium channel blockers. We will schedule him for a followup nuclear stress test to see if this is changed significantly since March of 2011. Indications for cardiac catheterization would be increasing symptoms or worse performance on stress testing.

## 2011-01-15 ENCOUNTER — Ambulatory Visit (HOSPITAL_COMMUNITY): Payer: Medicare Other | Attending: Internal Medicine | Admitting: Radiology

## 2011-01-15 VITALS — Ht 71.0 in | Wt 187.0 lb

## 2011-01-15 DIAGNOSIS — R0789 Other chest pain: Secondary | ICD-10-CM

## 2011-01-15 DIAGNOSIS — I2510 Atherosclerotic heart disease of native coronary artery without angina pectoris: Secondary | ICD-10-CM | POA: Insufficient documentation

## 2011-01-15 MED ORDER — TECHNETIUM TC 99M TETROFOSMIN IV KIT
33.0000 | PACK | Freq: Once | INTRAVENOUS | Status: AC | PRN
  Administered 2011-01-15: 33 via INTRAVENOUS

## 2011-01-15 MED ORDER — REGADENOSON 0.4 MG/5ML IV SOLN
0.4000 mg | Freq: Once | INTRAVENOUS | Status: AC
  Administered 2011-01-15: 0.4 mg via INTRAVENOUS

## 2011-01-15 MED ORDER — TECHNETIUM TC 99M TETROFOSMIN IV KIT
11.0000 | PACK | Freq: Once | INTRAVENOUS | Status: AC | PRN
  Administered 2011-01-15: 11 via INTRAVENOUS

## 2011-01-15 NOTE — Progress Notes (Signed)
[facility] SITE 3 NUCLEAR MED [address] [place] [address] [phone]  Cardiology Nuclear Med Study  Jveon Pound Droke is a 65 y.o. male [id number] 1945-09-14   Nuclear Med Background Indication for Stress Test:  Evaluation for Ischemia and Stent Patency History:  '08 Stent-LAD/RCA; '09 Cath:Patent stents, known high grade "jailed" diag. ; '11 OZH:YQMVHQ, EF=67% Cardiac Risk Factors: Family History - CAD, History of Smoking, Hypertension and Lipids  Symptoms:  Chest Pain, "Burning" and Chest Pressure with and without Exertion (last episode of chest discomfort was yesterday)   Nuclear Pre-Procedure Caffeine/Decaff Intake:  None NPO After: 6:30pm   Lungs:  Clear.  IV 0.9% NS with Angio Cath:  20g  IV Site: R Antecubital x 1, tolerated well IV Started by:  Irean Hong, RN  Chest Size (in):  40 Cup Size: n/a  Height: 5\' 11"  (1.803 m)  Weight:  187 lb (84.823 kg)  BMI:  Body mass index is 26.08 kg/(m^2). Tech Comments:  Bystolic held x 24 hours    Nuclear Med Study 1 or 2 day study: 1 day  Stress Test Type:  Treadmill/Lexiscan  Reading MD: Arvilla Meres, MD  Order Authorizing Provider:  Peter Swaziland, MD, Geoffry Paradise, MD per patient  Resting Radionuclide: Technetium 27m Tetrofosmin  Resting Radionuclide Dose: 11.0 mCi   Stress Radionuclide:  Technetium 90m Tetrofosmin  Stress Radionuclide Dose: 33.0 mCi           Stress Protocol Rest HR: 49 Stress HR: 114  Rest BP: Sitting  122/73  Standing  146/70 Stress BP: 190/68  Exercise Time (min): 12:00 METS: 10.9   Predicted Max HR: 154 bpm % Max HR: 74.03 bpm Rate Pressure Product: 46962   Dose of Adenosine (mg):  n/a Dose of Lexiscan: 0.4 mg  Dose of Atropine (mg): n/a Dose of Dobutamine: n/a mcg/kg/min (at max HR)  Stress Test Technologist: Smiley Houseman, CMA-N  Nuclear Technologist:  Domenic Polite, CNMT     Rest Procedure:  Myocardial perfusion imaging was performed at rest 45 minutes  following the intravenous administration of Technetium 76m Tetrofosmin.  Rest ECG: Probable prior SWMI, sinus bradycardia.  Stress Procedure:  The patient initially walked the treadmill for 10-minutes utilizing the Bruce protocol, but was unable to obtain his target heart rate.  He was then given IV Lexiscan 0.4 mg over 15-seconds with low level exercise and then Technetium 64m Tetrofosmin was injected at 30-seconds while the patient continued walking one more minute.  There were no diagnostic ST-T wave changes with Lexiscan, only occasional PAC's in recovery.  The patient c/o his chest "burning" three minutes into exercise.  The pain level got to 5-6/10 prior to stopping.  Quantitative spect images were obtained after a 45-minute delay.  Stress ECG: No significant change from baseline ECG  QPS Raw Data Images:  Normal; no motion artifact; normal heart/lung ratio. Stress Images:  Normal homogeneous uptake in all areas of the myocardium. Rest Images:  Normal homogeneous uptake in all areas of the myocardium. Subtraction (SDS):  No evidence of ischemia. Transient Ischemic Dilatation (Normal <1.22):  1.05 Lung/Heart Ratio (Normal <0.45):  0.33  Quantitative Gated Spect Images QGS EDV:  115 ml QGS ESV:  45 ml QGS cine images:  NL LV Function; NL Wall Motion QGS EF: 61%  Impression Exercise Capacity:  Lexiscan with low level exercise. BP Response:  Normal blood pressure response. Clinical Symptoms:  6/10 CP ECG Impression:  No significant ST segment change suggestive of ischemia. Comparison  with Prior Nuclear Study: No significant change from previous study  Overall Impression:  Normal stress nuclear study.    Daniel Bensimhon

## 2011-01-20 ENCOUNTER — Telehealth: Payer: Self-pay | Admitting: *Deleted

## 2011-01-20 ENCOUNTER — Telehealth: Payer: Self-pay | Admitting: Cardiology

## 2011-01-20 NOTE — Progress Notes (Signed)
lm

## 2011-01-20 NOTE — Telephone Encounter (Signed)
Notified of stress test results. Will send to Dr. Jacky Kindle. Wants to know if Dr. Swaziland has reviewed his BP readings that he brought in when had stress test. Advised he had been gone for a week and would review. Will call if him he wants to change any meds.

## 2011-01-20 NOTE — Telephone Encounter (Signed)
Pt returning your call about test results. °

## 2011-01-21 ENCOUNTER — Telehealth: Payer: Self-pay | Admitting: *Deleted

## 2011-01-21 NOTE — Telephone Encounter (Signed)
Called pt after Dr.Jordan reviewed his list of BP readings that pt had left w/nuclear. BP ranging114/49 to 171/69; most of readings are in 154/63 to 174/73 range. Per Dr. Swaziland advised to increase Bystolic to 20 mg daily. He will continue to monitor BP and call us in about 2 weeks to report readings.

## 2011-02-19 ENCOUNTER — Telehealth: Payer: Self-pay | Admitting: Cardiology

## 2011-02-19 ENCOUNTER — Other Ambulatory Visit: Payer: Self-pay

## 2011-02-19 MED ORDER — NEBIVOLOL HCL 20 MG PO TABS: 20.0000 mg | ORAL_TABLET | Freq: Every day | ORAL | Status: DC

## 2011-02-19 NOTE — Telephone Encounter (Signed)
Call after 11a, pt calling re getting a new rx , requesting bystolic 20 mg , uses [facility]

## 2011-02-20 NOTE — Telephone Encounter (Signed)
Called requesting new Rx for Bystolic 20 mg. States he is in donut hole and can't afford to get 90 days now. Advised him that if we order now they will charge him. Advised we do have some samples of Bystolic 10 mg and can take 2 of those until 1st of year and then call to get refill sent to mail order.

## 2011-02-20 NOTE — Telephone Encounter (Signed)
Follow up on previous call:  Patient would like to speak with Synetta Fail before calling in Rx.   New Rx for Bystolic  -  90 day supply  [facility] . Patient states he on a new dosage.

## 2011-03-03 ENCOUNTER — Telehealth: Payer: Self-pay | Admitting: Cardiology

## 2011-03-03 NOTE — Telephone Encounter (Signed)
New msg Pt had some questions about his bystolic med that was called in for 90 day supply

## 2011-03-03 NOTE — Telephone Encounter (Signed)
Called stating that he received a 90 day supply today on Bystolic. He had not wanted refilled at this time because he was in donut hole. Advised that if he had refills on his RX that the company would automatically send to our refill line. See that on 12/5 a 30 day supply was refilled. Advised he would need to call the company and advise them not to send refills.

## 2011-03-20 ENCOUNTER — Telehealth: Payer: Self-pay | Admitting: Physician Assistant

## 2011-03-20 NOTE — Telephone Encounter (Signed)
[name] called this evening because he happened to check his blood pressure and he was significantly higher than usual. Since then, he has checked it several times. The readings range from the 170s to the 200s. A couple of the machines were at [facility] and the other reading was on his home blood pressure machine. He is compliant with his medications. His bystolic was recently changed from 10 mg daily to 20 mg daily. He took the 20 mg tablet he received by mail order for the first time today. He has not checked his blood pressure in the last 2 weeks or so. He is denying any symptoms such as shortness of breath, headache, visual disturbances or weakness.  I reviewed [name]'s medications with him. He is compliant with all his medications and takes them in the morning except for the lisinopril which he takes in the evening. I advised him that he could take Norvasc 5 mg twice a day and follow his blood pressure on that dose change. I advised him to start taking his blood pressure a couple of times a day, at various times, to see what the range is on a daily basis. I advised him that if he developed any symptoms at all, he should call 911 and come to the emergency room. I have left a message with her office to call him for followup appointment.

## 2011-03-21 ENCOUNTER — Telehealth: Payer: Self-pay | Admitting: Cardiology

## 2011-03-21 NOTE — Telephone Encounter (Signed)
Patient called stated his blood pressure has been elevated,203/93,196/90,208/64,171/69.States called PA on call last night 03/20/11,was advised to increase amlodipine to 10 mg daily.States blood pressure today 155/67.Advised to continue amlodipine 10 mg daily,and keep appointment with Dr.Jordan 03/25/11.

## 2011-03-21 NOTE — Telephone Encounter (Signed)
New Msg: Pt calling regarding pt high BP. Pt wanted to know what he should be advised to do between now and pt appt on 1/8. Please return pt call to discuss further.

## 2011-03-25 ENCOUNTER — Encounter: Payer: Self-pay | Admitting: Nurse Practitioner

## 2011-03-25 ENCOUNTER — Ambulatory Visit (INDEPENDENT_AMBULATORY_CARE_PROVIDER_SITE_OTHER): Payer: Medicare Other | Admitting: Nurse Practitioner

## 2011-03-25 VITALS — BP 120/60 | HR 54 | Ht 73.0 in | Wt 190.0 lb

## 2011-03-25 DIAGNOSIS — I10 Essential (primary) hypertension: Secondary | ICD-10-CM

## 2011-03-25 NOTE — Patient Instructions (Signed)
Stay on your current medicines.  Continue to monitor your blood pressure at home. Change the batteries in your machine.  I will see you back in a month.  Minimize your salt. Recommended to be below 2000mg  per day.  Call the [facility] office at [phone] if you have any questions, problems or concerns.

## 2011-03-25 NOTE — Progress Notes (Signed)
[name] Date of Birth: [birth year] Medical Record #[id number]  History of Present Illness: [name] is seen today for a follow up visit. He is seen for Dr. Swaziland. He has a history of HTN and known CAD with remote stents. Nuclear in October of 2012 was ok. He called late last week and reported that his blood pressures had been running high. He had readings in the 200 systolic range. He called and spoke to the PA on call. The PA recommended that he increase his Norvasc to 10 mg a day. Blood pressure did come down to 155/67 when he called to make the appointment.   He comes in today. He brings in his list of readings. His blood pressure has come down with the additional Norvasc. He now has some headaches which he attributes to the higher dose of Norvasc. Chest pain remains at his baseline. He has gotten way off track with his diet and has had too much salt. He also brings in his cuff to see if it correlates.  Current Outpatient Prescriptions on File Prior to Visit  Medication Sig Dispense Refill  . aspirin 81 MG tablet Take 81 mg by mouth daily.        Marland Kitchen atorvastatin (LIPITOR) 40 MG tablet Take 40 mg by mouth daily.        . carbamazepine (TEGRETOL) 200 MG tablet Take 200 mg by mouth daily. 5 TABLETS DAILY       . clopidogrel (PLAVIX) 75 MG tablet Take 1 tablet (75 mg total) by mouth daily.  90 tablet  3  . lisinopril (PRINIVIL,ZESTRIL) 40 MG tablet Take 1 tablet (40 mg total) by mouth daily.  90 tablet  3  . Nebivolol HCl (BYSTOLIC) 20 MG TABS Take 1 tablet (20 mg total) by mouth daily.  30 tablet  5  . valACYclovir (VALTREX) 500 MG tablet Take 500 mg by mouth daily.        Marland Kitchen DISCONTD: amLODipine (NORVASC) 5 MG tablet Take 1 tablet (5 mg total) by mouth daily.  90 tablet  3    No Known Allergies  Past Medical History  Diagnosis Date  . Hypertension   . Coronary artery disease     s/p extensive stenting of the mid to distal LAD with 3 Cypher stents and past stenting of the mid RCA.  He does have a diagonal branch that was jailed. He has stable angina. Negative myoview in October of 2012; Managed medically.   . Hypercholesterolemia   . Seizure disorder   . Knee pain   . Vagal reaction   . Cervical spine disease     History of cervical spine disease  . Normal nuclear stress test October 2012    Past Surgical History  Procedure Date  . Hernia repair   . Shoulder arthroscopy     right    History  Smoking status  . Former Smoker  . Quit date: 07/03/1965  Smokeless tobacco  . Not on file    History  Alcohol Use No    Family History  Problem Relation Age of Onset  . Heart failure Father 53  . Hyperlipidemia Mother     Review of Systems: The review of systems is per the HPI. No swelling. No palpitations. Chest pain is at his baseline.  All other systems were reviewed and are negative.  Physical Exam: BP 120/60  Pulse 54  Ht 6\' 1"  (1.854 m)  Wt 190 lb (86.183 kg)  BMI 25.07 kg/m2  Patient is very pleasant and in no acute distress. Skin is warm and dry. Color is normal.  HEENT is unremarkable. Normocephalic/atraumatic. PERRL. Sclera are nonicteric. Neck is supple. No masses. No JVD. Lungs are clear. Cardiac exam shows a regular rate and rhythm. Abdomen is soft. Extremities are without edema. Gait and ROM are intact. No gross neurologic deficits noted.  LABORATORY DATA:   Assessment / Plan:

## 2011-03-25 NOTE — Assessment & Plan Note (Signed)
I have checked his cuff in both arms and checked his blood pressure manually in both arms. His cuff is running 20 points higher. I do not think he needs additional medicines at this time. He is going to change the batteries in his unit or may just get a new one. We will keep him on his current medicines. I will see him back in a month. He is to continue to monitor his blood pressure at home. He is doing to do better about his salt use. Patient is agreeable to this plan and will call if any problems develop in the interim.

## 2011-03-26 ENCOUNTER — Telehealth: Payer: Self-pay | Admitting: Cardiology

## 2011-03-26 NOTE — Telephone Encounter (Signed)
New problem. 

## 2011-03-31 ENCOUNTER — Telehealth: Payer: Self-pay | Admitting: Cardiology

## 2011-03-31 NOTE — Telephone Encounter (Signed)
Patient called stating he needs 7 days of amlodipine 5 mg. States he takes 2 tablets daily,and his mail order will take 7 days to deliver.[facility] was called and order given amlodipine 5 mg 2 tablets daily # 14 tablets no refills.

## 2011-03-31 NOTE — Telephone Encounter (Signed)
New msg Pt wants to talk about his meds please call

## 2011-04-25 ENCOUNTER — Ambulatory Visit (INDEPENDENT_AMBULATORY_CARE_PROVIDER_SITE_OTHER): Payer: Medicare Other | Admitting: Nurse Practitioner

## 2011-04-25 ENCOUNTER — Encounter: Payer: Self-pay | Admitting: Nurse Practitioner

## 2011-04-25 VITALS — BP 108/58 | HR 66 | Ht 73.0 in | Wt 187.0 lb

## 2011-04-25 DIAGNOSIS — I10 Essential (primary) hypertension: Secondary | ICD-10-CM

## 2011-04-25 DIAGNOSIS — I2510 Atherosclerotic heart disease of native coronary artery without angina pectoris: Secondary | ICD-10-CM

## 2011-04-25 NOTE — Assessment & Plan Note (Signed)
Blood pressure is great. His new cuff correlates very well. He notes that his headaches are better and since his angina is basically resolved, I have asked him to stay with the current course. He is willing. We will see him back in 3 months. He will continue to monitor at home. Patient is agreeable to this plan and will call if any problems develop in the interim.

## 2011-04-25 NOTE — Patient Instructions (Addendum)
Stay on your current medicines. Monitor your blood pressure at home. Let us know for readings staying below 110 systolic. Try to stay on track with your diet and exercise.   We will see you back in 3 months.  Call the [facility] office at [phone] if you have any questions, problems or concerns.

## 2011-04-25 NOTE — Progress Notes (Signed)
[name] Date of Birth: [birth year] Medical Record #[id number]  History of Present Illness: Alwyn is seen back today for a one month check. He is seen for Dr. Swaziland. He has had his Norvasc increased recently for his blood pressure. His cuff was not accurate. He has since gotten a new one. Has gotten back on track with diet and exercise. Feels good. Continues to have some headaches but thinks they are getting better.  Not short of breath. He does get a little lightheaded if he gets up too quickly. His readings at home show good blood pressure control. He also notes that with the increase in the Norvasc, his angina is basically resolved. He was quite surprised. His erectile dysfunction has improved as well.   Current Outpatient Prescriptions on File Prior to Visit  Medication Sig Dispense Refill  . amLODipine (NORVASC) 5 MG tablet Take 5 mg by mouth 2 (two) times daily.       Marland Kitchen aspirin 81 MG tablet Take 81 mg by mouth daily.        Marland Kitchen atorvastatin (LIPITOR) 40 MG tablet Take 40 mg by mouth daily.        . carbamazepine (TEGRETOL) 200 MG tablet Take 200 mg by mouth daily. 5 TABLETS DAILY       . clopidogrel (PLAVIX) 75 MG tablet Take 1 tablet (75 mg total) by mouth daily.  90 tablet  3  . lisinopril (PRINIVIL,ZESTRIL) 40 MG tablet Take 1 tablet (40 mg total) by mouth daily.  90 tablet  3  . Nebivolol HCl (BYSTOLIC) 20 MG TABS Take 1 tablet (20 mg total) by mouth daily.  30 tablet  5  . valACYclovir (VALTREX) 500 MG tablet Take 500 mg by mouth daily.        Marland Kitchen DISCONTD: amLODipine (NORVASC) 5 MG tablet Take 1 tablet (5 mg total) by mouth daily.  90 tablet  3    No Known Allergies  Past Medical History  Diagnosis Date  . Hypertension   . Coronary artery disease     s/p extensive stenting of the mid to distal LAD with 3 Cypher stents and past stenting of the mid RCA. He does have a diagonal branch that was jailed. He has stable angina. Negative myoview in October of 2012; Managed medically.    . Hypercholesterolemia   . Seizure disorder   . Knee pain   . Vagal reaction   . Cervical spine disease     History of cervical spine disease  . Normal nuclear stress test October 2012    Past Surgical History  Procedure Date  . Hernia repair   . Shoulder arthroscopy     right    History  Smoking status  . Former Smoker  . Quit date: 07/03/1965  Smokeless tobacco  . Not on file    History  Alcohol Use No    Family History  Problem Relation Age of Onset  . Heart failure Father 19  . Hyperlipidemia Mother     Review of Systems: The review of systems is per the HPI.  All other systems were reviewed and are negative.  Physical Exam: BP 108/58  Pulse 66  Ht 6\' 1"  (1.854 m)  Wt 187 lb (84.823 kg)  BMI 24.67 kg/m2 Patient is very pleasant and in no acute distress. Skin is warm and dry. Color is normal.  HEENT is unremarkable. Normocephalic/atraumatic. PERRL. Sclera are nonicteric. Neck is supple. No masses. No JVD. Lungs are clear. Cardiac exam  shows a regular rate and rhythm. Abdomen is soft. Extremities are without edema. Gait and ROM are intact. No gross neurologic deficits noted.   LABORATORY DATA:   Assessment / Plan:

## 2011-04-25 NOTE — Assessment & Plan Note (Signed)
His angina that he has had chronically is now resolved with higher doses of Norvasc.

## 2011-04-28 ENCOUNTER — Telehealth: Payer: Self-pay | Admitting: Nurse Practitioner

## 2011-04-28 NOTE — Telephone Encounter (Signed)
New msg Pt said he came to see Lawson Fiscal last friday. He was switched to 5 mg of amlodipine twice a day. He wants something sent to Dr Jacky Kindle at [facility] medical of this med change. Please let him know

## 2011-04-29 NOTE — Telephone Encounter (Signed)
Left message, we sent his office note to dr.aronson's office yesterday

## 2011-05-06 ENCOUNTER — Telehealth: Payer: Self-pay | Admitting: Nurse Practitioner

## 2011-05-06 MED ORDER — AMLODIPINE BESYLATE 5 MG PO TABS: 5.0000 mg | ORAL_TABLET | Freq: Two times a day (BID) | ORAL | Status: DC

## 2011-05-06 NOTE — Telephone Encounter (Signed)
New msg Pt wanted to Talk to [facility] about amlodipine. Please call back He said he will not be home between 215-3

## 2011-05-06 NOTE — Telephone Encounter (Signed)
Norvasc refilled per request.

## 2011-06-26 ENCOUNTER — Other Ambulatory Visit: Payer: Self-pay | Admitting: Cardiology

## 2011-06-26 DIAGNOSIS — I10 Essential (primary) hypertension: Secondary | ICD-10-CM

## 2011-06-26 DIAGNOSIS — I2510 Atherosclerotic heart disease of native coronary artery without angina pectoris: Secondary | ICD-10-CM

## 2011-06-26 MED ORDER — LISINOPRIL 40 MG PO TABS: 40.0000 mg | ORAL_TABLET | Freq: Every day | ORAL | Status: DC

## 2011-06-26 MED ORDER — CLOPIDOGREL BISULFATE 75 MG PO TABS: 75.0000 mg | ORAL_TABLET | Freq: Every day | ORAL | Status: DC

## 2011-07-18 ENCOUNTER — Ambulatory Visit (INDEPENDENT_AMBULATORY_CARE_PROVIDER_SITE_OTHER): Payer: Medicare Other | Admitting: Cardiology

## 2011-07-18 ENCOUNTER — Encounter: Payer: Self-pay | Admitting: Cardiology

## 2011-07-18 VITALS — BP 113/63 | HR 59 | Wt 188.6 lb

## 2011-07-18 DIAGNOSIS — I209 Angina pectoris, unspecified: Secondary | ICD-10-CM

## 2011-07-18 DIAGNOSIS — E785 Hyperlipidemia, unspecified: Secondary | ICD-10-CM

## 2011-07-18 DIAGNOSIS — I10 Essential (primary) hypertension: Secondary | ICD-10-CM

## 2011-07-18 DIAGNOSIS — E7800 Pure hypercholesterolemia, unspecified: Secondary | ICD-10-CM

## 2011-07-18 DIAGNOSIS — I2510 Atherosclerotic heart disease of native coronary artery without angina pectoris: Secondary | ICD-10-CM

## 2011-07-18 NOTE — Assessment & Plan Note (Signed)
Blood pressure is now well controlled with the higher dose of amlodipine.

## 2011-07-18 NOTE — Progress Notes (Signed)
   [name] Date of Birth: Jul 18, 1945 Medical Record #[id number]  History of Present Illness: [name] is seen back today for a followup visit. He was seen in January with increased blood pressure readings. His amlodipine dose was increased. Following this he had a significant headache but this eventually resolved. His blood pressure has improved significantly. On the benefits of the increased amlodipine was that his prior anginal symptoms have resolved. He feels that he is able to push himself now and has very infrequent chest pain.  Current Outpatient Prescriptions on File Prior to Visit  Medication Sig Dispense Refill  . amLODipine (NORVASC) 5 MG tablet Take 1 tablet (5 mg total) by mouth 2 (two) times daily.  180 tablet  3  . aspirin 81 MG tablet Take 81 mg by mouth daily.        Marland Kitchen atorvastatin (LIPITOR) 40 MG tablet Take 40 mg by mouth daily.        . carbamazepine (TEGRETOL) 200 MG tablet Take 200 mg by mouth daily. 5 TABLETS DAILY       . clopidogrel (PLAVIX) 75 MG tablet Take 1 tablet (75 mg total) by mouth daily.  90 tablet  3  . lisinopril (PRINIVIL,ZESTRIL) 40 MG tablet Take 1 tablet (40 mg total) by mouth daily.  90 tablet  3  . Nebivolol HCl (BYSTOLIC) 20 MG TABS Take 1 tablet (20 mg total) by mouth daily.  30 tablet  5  . valACYclovir (VALTREX) 500 MG tablet Take 500 mg by mouth daily.          No Known Allergies  Past Medical History  Diagnosis Date  . Hypertension   . Coronary artery disease     s/p extensive stenting of the mid to distal LAD with 3 Cypher stents and past stenting of the mid RCA. He does have a diagonal branch that was jailed. He has stable angina. Negative myoview in October of 2012; Managed medically.   . Hypercholesterolemia   . Seizure disorder   . Knee pain   . Vagal reaction   . Cervical spine disease     History of cervical spine disease  . Normal nuclear stress test October 2012    Past Surgical History  Procedure Date  . Hernia repair     . Shoulder arthroscopy     right    History  Smoking status  . Former Smoker  . Quit date: 07/03/1965  Smokeless tobacco  . Not on file    History  Alcohol Use No    Family History  Problem Relation Age of Onset  . Heart failure Father 25  . Hyperlipidemia Mother     Review of Systems: The review of systems is per the HPI.  All other systems were reviewed and are negative.  Physical Exam: BP 113/63  Pulse 59  Wt 188 lb 9.6 oz (85.548 kg) Patient is very pleasant and in no acute distress. Skin is warm and dry. Color is normal.  HEENT is unremarkable. Normocephalic/atraumatic. PERRL. Sclera are nonicteric. Neck is supple. No masses. No JVD. Lungs are clear. Cardiac exam shows a regular rate and rhythm. Abdomen is soft. Extremities are without edema. Gait and ROM are intact. No gross neurologic deficits noted.   LABORATORY DATA:   Assessment / Plan:

## 2011-07-18 NOTE — Assessment & Plan Note (Signed)
He is on 40 mg of atorvastatin. His lab work is followed by his primary care.

## 2011-07-18 NOTE — Assessment & Plan Note (Signed)
He is status post multiple stenting procedures in the past. Despite excellent result he continued to experience anginal symptoms. This may of been related to a jailed diagonal branch. His symptoms have improved significantly with the increased dose of amlodipine. We'll continue with his current therapy.

## 2011-07-18 NOTE — Patient Instructions (Signed)
Continue your current medication.  Try and adjust your diet along with the Mediterranean diet.  I will see you again in 6 months.

## 2011-08-22 ENCOUNTER — Other Ambulatory Visit: Payer: Self-pay | Admitting: Cardiology

## 2011-08-22 DIAGNOSIS — G40209 Localization-related (focal) (partial) symptomatic epilepsy and epileptic syndromes with complex partial seizures, not intractable, without status epilepticus: Secondary | ICD-10-CM | POA: Insufficient documentation

## 2011-08-22 DIAGNOSIS — Z5181 Encounter for therapeutic drug level monitoring: Secondary | ICD-10-CM | POA: Insufficient documentation

## 2011-08-22 MED ORDER — NEBIVOLOL HCL 20 MG PO TABS: 20.0000 mg | ORAL_TABLET | Freq: Every day | ORAL | Status: DC

## 2012-01-20 ENCOUNTER — Encounter: Payer: Self-pay | Admitting: Cardiology

## 2012-01-20 ENCOUNTER — Ambulatory Visit (INDEPENDENT_AMBULATORY_CARE_PROVIDER_SITE_OTHER): Payer: Medicare Other | Admitting: Cardiology

## 2012-01-20 VITALS — BP 141/68 | HR 51 | Ht 73.0 in | Wt 190.8 lb

## 2012-01-20 DIAGNOSIS — I2510 Atherosclerotic heart disease of native coronary artery without angina pectoris: Secondary | ICD-10-CM

## 2012-01-20 DIAGNOSIS — E7800 Pure hypercholesterolemia, unspecified: Secondary | ICD-10-CM

## 2012-01-20 DIAGNOSIS — I10 Essential (primary) hypertension: Secondary | ICD-10-CM

## 2012-01-20 NOTE — Patient Instructions (Addendum)
Continue your current medication  I would consider adding HCTZ for blood pressure control but we will await your lab work next week.  I will see you again in 6 months.

## 2012-01-21 NOTE — Progress Notes (Signed)
[name] Date of Birth: [birth year] Medical Record #[id number]  History of Present Illness: [name] is seen back today for a followup visit. He is doing very well. He denies any significant chest pain. His angina actually improved significantly with increasing his amlodipine dose. He is status post extensive stenting of the LAD with Cypher stent. He had stenting of the right coronary as well. He has a diagonal branch that was jailed by the stents. Followup stress testing has been normal. He reports that recent blood pressure readings have been a little bit elevated.  Current Outpatient Prescriptions on File Prior to Visit  Medication Sig Dispense Refill  . amLODipine (NORVASC) 5 MG tablet Take 1 tablet (5 mg total) by mouth 2 (two) times daily.  180 tablet  3  . aspirin 81 MG tablet Take 81 mg by mouth daily.        Marland Kitchen atorvastatin (LIPITOR) 40 MG tablet Take 40 mg by mouth daily.        . carbamazepine (TEGRETOL) 200 MG tablet Take 200 mg by mouth daily. 5 TABLETS DAILY       . clopidogrel (PLAVIX) 75 MG tablet Take 1 tablet (75 mg total) by mouth daily.  90 tablet  3  . lisinopril (PRINIVIL,ZESTRIL) 40 MG tablet Take 1 tablet (40 mg total) by mouth daily.  90 tablet  3  . Nebivolol HCl (BYSTOLIC) 20 MG TABS Take 1 tablet (20 mg total) by mouth daily.  30 tablet  11  . valACYclovir (VALTREX) 500 MG tablet Take 500 mg by mouth daily.          No Known Allergies  Past Medical History  Diagnosis Date  . Hypertension   . Coronary artery disease     s/p extensive stenting of the mid to distal LAD with 3 Cypher stents and past stenting of the mid RCA. He does have a diagonal branch that was jailed. He has stable angina. Negative myoview in October of 2012; Managed medically.   . Hypercholesterolemia   . Seizure disorder   . Knee pain   . Vagal reaction   . Cervical spine disease     History of cervical spine disease  . Normal nuclear stress test October 2012    Past Surgical History    Procedure Date  . Hernia repair   . Shoulder arthroscopy     right    History  Smoking status  . Former Smoker  . Quit date: 07/03/1965  Smokeless tobacco  . Not on file    History  Alcohol Use No    Family History  Problem Relation Age of Onset  . Heart failure Father 32  . Hyperlipidemia Mother     Review of Systems: The review of systems is per the HPI.  All other systems were reviewed and are negative.  Physical Exam: BP 141/68  Pulse 51  Ht 6\' 1"  (1.854 m)  Wt 86.546 kg (190 lb 12.8 oz)  BMI 25.17 kg/m2 Patient is very pleasant and in no acute distress. Skin is warm and dry. Color is normal.  HEENT is unremarkable. Normocephalic/atraumatic. PERRL. Sclera are nonicteric. Neck is supple. No masses. No JVD. Lungs are clear. Cardiac exam shows a regular rate and rhythm. Abdomen is soft. Extremities are without edema. Gait and ROM are intact. No gross neurologic deficits noted.   LABORATORY DATA: ECG demonstrates sinus bradycardia with a rate of 51 beats per minute. He has a left anterior fascicular block. There is LVH  by voltage.  Assessment / Plan: 1. Coronary disease with prior extensive stenting of the LAD and right coronaries. Anginal symptoms have improved with medical therapy. Prior stress testing showed no evidence of ischemia. We will continue with his current medical management and followup again in 6 months.  2. Hypertension. Blood pressure has been mildly elevated recently. He has followup with his primary care next week. If his blood pressure remains elevated would consider addition of HCTZ if his renal function is good.  3. Hypercholesterolemia. Continue atorvastatin. Followup next week with primary care for lab work.

## 2012-02-02 ENCOUNTER — Other Ambulatory Visit: Payer: Self-pay

## 2012-02-02 MED ORDER — AMLODIPINE BESYLATE 5 MG PO TABS: 5.0000 mg | ORAL_TABLET | Freq: Two times a day (BID) | ORAL | Status: DC

## 2012-03-26 ENCOUNTER — Other Ambulatory Visit: Payer: Self-pay

## 2012-03-26 DIAGNOSIS — I2510 Atherosclerotic heart disease of native coronary artery without angina pectoris: Secondary | ICD-10-CM

## 2012-03-26 MED ORDER — CLOPIDOGREL BISULFATE 75 MG PO TABS: 75.0000 mg | ORAL_TABLET | Freq: Every day | ORAL | Status: DC

## 2012-04-06 ENCOUNTER — Telehealth: Payer: Self-pay | Admitting: Cardiology

## 2012-04-06 NOTE — Telephone Encounter (Signed)
Pt would like a call on his cell and he wants to talk to you about his heart meds and some changes that where suggested to him and he wants to go over this with you

## 2012-04-06 NOTE — Telephone Encounter (Signed)
Had a physical in December and changes in Lisinopril made (wanted him to start Lisinopril 20/12.5 mg bid) from Lisinopril 40 mg daily. He felt that the diuretic was drying him out. He called and they wanted to change to Benicar 40/12.5. Started the [employer] today.   Cholesterol medications were changed to Lipitor 60 mg (up from 40 mg). Since there is no 60 mg tablet he has to cut tablet in half and it is not scored. Does have history of muscle aches from statins in past and has tolerated Lipitor  He just wanted to make sure that these were appropriate changes.   Will forward to [facility] for review

## 2012-04-06 NOTE — Telephone Encounter (Signed)
Can you call and talk with him?

## 2012-04-07 NOTE — Telephone Encounter (Signed)
Please let him know that I looked at his notes/labs from [facility] (Dr. Swaziland had seen as well). Looks like BP is not at goal which is why the BP med was changed. I would agree with the additional BP medicine. Needs to have a follow up BMET to reassess kidney function, potassium, etc.   In regards to the increase in Lipitor. Looks like they were trying to get his LDL lower. May not tolerate the 60 mg of Lipitor and if has increase in myalgias, would just cut back to the 40 mg.   Overall, I agree with their plan of care.

## 2012-04-07 NOTE — Telephone Encounter (Signed)
Patient called was told Norma Fredrickson NP and Dr.Jordan reviewed your notes and labs from [facility] agreed with additional blood pressure medicine.Patient stated he had to stop lisinopril/hctz and benicar/hctz 40/12.5 mg daily started.States bmet was already done.[facility] called bmet requested.Patient also told he may not tolerate lipitor 60 mg.Advised to let Dr.Aronson know if he gets muscle pain.

## 2012-07-29 ENCOUNTER — Other Ambulatory Visit: Payer: Self-pay | Admitting: Cardiology

## 2012-07-29 ENCOUNTER — Telehealth: Payer: Self-pay

## 2012-07-29 MED ORDER — OLMESARTAN MEDOXOMIL 40 MG PO TABS: 40.0000 mg | ORAL_TABLET | Freq: Every day | ORAL | Status: DC

## 2012-07-29 NOTE — Telephone Encounter (Signed)
Received refill request for benicar 40 mg daily.Patient called he stated he was not able to take benicar/hctz.Stated hctz caused blurred vision and Dr.Aronson told him just take plain benicar 40 mg daily. Stated he was doing good on the plain benicar.Stated he wanted Dr.Jordan to refill.Stated he has appointment with Dr.Jordan 08/02/12.Benicar 40 mg sent to pharmacy.

## 2012-08-02 ENCOUNTER — Ambulatory Visit (INDEPENDENT_AMBULATORY_CARE_PROVIDER_SITE_OTHER): Payer: Medicare Other | Admitting: Cardiology

## 2012-08-02 ENCOUNTER — Encounter: Payer: Self-pay | Admitting: Cardiology

## 2012-08-02 VITALS — BP 130/64 | HR 54 | Ht 73.0 in | Wt 191.0 lb

## 2012-08-02 DIAGNOSIS — I10 Essential (primary) hypertension: Secondary | ICD-10-CM

## 2012-08-02 DIAGNOSIS — I2510 Atherosclerotic heart disease of native coronary artery without angina pectoris: Secondary | ICD-10-CM

## 2012-08-02 DIAGNOSIS — E7800 Pure hypercholesterolemia, unspecified: Secondary | ICD-10-CM

## 2012-08-02 NOTE — Progress Notes (Signed)
[name] Date of Birth: 1945-06-22 Medical Record #[id number]  History of Present Illness: [name] is seen back today for a followup visit. He is doing very well. He denies any significant chest pain.Marland Kitchen He is status post extensive stenting of the LAD with Cypher stent. He had stenting of the right coronary as well. He has a diagonal branch that was jailed by the stents. Followup stress testing has been normal. He reports his blood pressure has been well-controlled. He was tried on HCTZ but this resulted in a significant change in his vision. The symptoms resolved with stopping the HCTZ. He is concerned about taking Plavix since he bruises easily. His last LDL was 98. His Lipitor dose was increased to 60 mg per day but this caused him to have significant muscle pain in his calves. He went back down to 40 mg in the symptoms resolved.  Current Outpatient Prescriptions on File Prior to Visit  Medication Sig Dispense Refill  . amLODipine (NORVASC) 5 MG tablet Take 1 tablet (5 mg total) by mouth 2 (two) times daily.  180 tablet  3  . aspirin 81 MG tablet Take 81 mg by mouth daily.        Marland Kitchen atorvastatin (LIPITOR) 40 MG tablet Take 40 mg by mouth daily.        Marland Kitchen BYSTOLIC 20 MG TABS Take 1 tablet by mouth  daily  90 each  3  . clopidogrel (PLAVIX) 75 MG tablet Take 1 tablet (75 mg total) by mouth daily.  90 tablet  1  . olmesartan (BENICAR) 40 MG tablet Take 1 tablet (40 mg total) by mouth daily.  90 tablet  3  . valACYclovir (VALTREX) 500 MG tablet Take 500 mg by mouth daily.         No current facility-administered medications on file prior to visit.    Allergies  Allergen Reactions  . Hctz (Hydrochlorothiazide)     Visual changes.    Past Medical History  Diagnosis Date  . Hypertension   . Coronary artery disease     s/p extensive stenting of the mid to distal LAD with 3 Cypher stents and past stenting of the mid RCA. He does have a diagonal branch that was jailed. He has stable angina.  Negative myoview in October of 2012; Managed medically.   . Hypercholesterolemia   . Seizure disorder   . Knee pain   . Vagal reaction   . Cervical spine disease     History of cervical spine disease  . Normal nuclear stress test October 2012    Past Surgical History  Procedure Laterality Date  . Hernia repair    . Shoulder arthroscopy      right    History  Smoking status  . Former Smoker  . Quit date: 07/03/1965  Smokeless tobacco  . Not on file    History  Alcohol Use No    Family History  Problem Relation Age of Onset  . Heart failure Father 71  . Hyperlipidemia Mother     Review of Systems: The review of systems is per the HPI.  All other systems were reviewed and are negative.  Physical Exam: BP 130/64  Pulse 54  Ht 6\' 1"  (1.854 m)  Wt 191 lb (86.637 kg)  BMI 25.2 kg/m2 Patient is very pleasant and in no acute distress. Skin is warm and dry. Color is normal.  HEENT is unremarkable. Normocephalic/atraumatic. PERRL. Sclera are nonicteric. Neck is supple. No masses. No JVD.  Lungs are clear. Cardiac exam shows a regular rate and rhythm. Abdomen is soft. Extremities are without edema. Gait and ROM are intact. No gross neurologic deficits noted.   LABORATORY DATA: Most recent LDL was 98 with an HDL of 85.  Assessment / Plan: 1. Coronary disease with prior extensive stenting of the LAD and right coronaries with first generation Cypher stents. Anginal symptoms have improved with medical therapy. Prior stress testing showed no evidence of ischemia. We discussed the indication for long-term Plavix with first generation coated stents and the extensive nature of his prior stenting procedures. In the care of be reasonable at this point to reduce his Plavix to every other day and see how he does.  2. Hypertension. Blood pressure is currently well-controlled.  3. Hypercholesterolemia. Continue atorvastatin at current dose. Higher doses of statins resulted in myalgias.

## 2012-08-02 NOTE — Patient Instructions (Addendum)
Continue your current therapy.  Work on eating a heart healthy diet (Mediterranean) and exercising regularly.

## 2012-08-27 ENCOUNTER — Encounter: Payer: Self-pay | Admitting: Neurology

## 2012-08-27 DIAGNOSIS — G40209 Localization-related (focal) (partial) symptomatic epilepsy and epileptic syndromes with complex partial seizures, not intractable, without status epilepticus: Secondary | ICD-10-CM

## 2012-08-27 DIAGNOSIS — Z5181 Encounter for therapeutic drug level monitoring: Secondary | ICD-10-CM

## 2012-08-30 ENCOUNTER — Encounter: Payer: Self-pay | Admitting: Neurology

## 2012-08-30 ENCOUNTER — Ambulatory Visit (INDEPENDENT_AMBULATORY_CARE_PROVIDER_SITE_OTHER): Payer: Medicare Other | Admitting: Neurology

## 2012-08-30 VITALS — BP 139/68 | HR 49 | Wt 186.0 lb

## 2012-08-30 DIAGNOSIS — G40209 Localization-related (focal) (partial) symptomatic epilepsy and epileptic syndromes with complex partial seizures, not intractable, without status epilepticus: Secondary | ICD-10-CM

## 2012-08-30 DIAGNOSIS — Z5181 Encounter for therapeutic drug level monitoring: Secondary | ICD-10-CM

## 2012-08-30 NOTE — Progress Notes (Signed)
Reason for visit: Seizures  [name] is an 67 y.o. male  History of present illness:  [name] is a 67 year old right-handed white male with a history of seizures that have been under good control. Within the last 6-8 months, the patient became toxic for some reason on his carbamazepine, and the dosing was dropped from 5 tablets daily to 4 tablets daily. The patient has had good blood levels on this current dose. The patient last had a carbamazepine level on 03/18/2012, with a level of 10.9. The patient returns for an evaluation. On the lower dose, the patient feels much better.  Past Medical History  Diagnosis Date  . Hypertension   . Coronary artery disease     s/p extensive stenting of the mid to distal LAD with 3 Cypher stents and past stenting of the mid RCA. He does have a diagonal branch that was jailed. He has stable angina. Negative myoview in October of 2012; Managed medically.   . Hypercholesterolemia   . Seizure disorder   . Knee pain   . Vagal reaction   . Cervical spine disease     History of cervical spine disease  . Normal nuclear stress test October 2012    Past Surgical History  Procedure Laterality Date  . Hernia repair    . Shoulder arthroscopy      right  . Vasectomy    . Coronary artery stent placement      Family History  Problem Relation Age of Onset  . Heart failure Father 7  . Hyperlipidemia Mother     Social history:  reports that he quit smoking about 47 years ago. He does not have any smokeless tobacco history on file. He reports that he does not drink alcohol or use illicit drugs.  Allergies:  Allergies  Allergen Reactions  . Hctz (Hydrochlorothiazide)     Visual changes.    Medications:  Current Outpatient Prescriptions on File Prior to Visit  Medication Sig Dispense Refill  . amLODipine (NORVASC) 5 MG tablet Take 1 tablet (5 mg total) by mouth 2 (two) times daily.  180 tablet  3  . aspirin 81 MG tablet Take 81 mg by mouth  daily.        Marland Kitchen atorvastatin (LIPITOR) 40 MG tablet Take 40 mg by mouth daily.        Marland Kitchen BYSTOLIC 20 MG TABS Take 1 tablet by mouth  daily  90 each  3  . carbamazepine (TEGRETOL) 200 MG tablet Take 200 mg by mouth 4 (four) times daily.       . clopidogrel (PLAVIX) 75 MG tablet Take 1 tablet (75 mg total) by mouth daily.  90 tablet  1  . olmesartan (BENICAR) 40 MG tablet Take 1 tablet (40 mg total) by mouth daily.  90 tablet  3  . valACYclovir (VALTREX) 500 MG tablet Take 500 mg by mouth daily.         No current facility-administered medications on file prior to visit.    ROS:  Out of a complete 14 system review of symptoms, the patient complains only of the following symptoms, and all other reviewed systems are negative.  Easy bruising Joint pain  Blood pressure 139/68, pulse 49, weight 186 lb (84.369 kg).  Physical Exam  General: The patient is alert and cooperative at the time of the examination.  Skin: No significant peripheral edema is noted.   Neurologic Exam  Cranial nerves: Facial symmetry is present. Speech is normal,  no aphasia or dysarthria is noted. Extraocular movements are full. Visual fields are full.  Motor: The patient has good strength in all 4 extremities.  Coordination: The patient has good finger-nose-finger and heel-to-shin bilaterally.  Gait and station: The patient has a normal gait. Tandem gait is normal. Romberg is negative. No drift is seen.  Reflexes: Deep tendon reflexes are symmetric.   Assessment/Plan:  One. History seizures, under good control  The patient doing quite well at this point. We will once again check a carbamazepine level. The patient will followup in one year. The patient will contact our office if any medical issues arise. He will go on vitamin D supplementation taking 1000 international units daily.  Dustin Palau MD 08/30/2012 7:43 PM  [facility] [address] [place], [place]  [address]  Phone [phone] Fax [phone]

## 2012-08-31 ENCOUNTER — Telehealth: Payer: Self-pay | Admitting: *Deleted

## 2012-08-31 NOTE — Telephone Encounter (Signed)
I called and gave the results of the carbamazepine level to the pt. He verbalized understanding.

## 2012-08-31 NOTE — Telephone Encounter (Signed)
Message copied by Hermenia Fiscal on Tue Aug 31, 2012 12:17 PM ------      Message from: Stephanie Acre      Created: Tue Aug 31, 2012 11:58 AM       Carbamazepine level is in the upper therapeutic range. No change in dosing.            ----- Message -----         From: [facility] Lab Results In Interface         Sent: 08/31/2012   5:44 AM           To: York Spaniel, MD                   ------

## 2012-09-16 ENCOUNTER — Other Ambulatory Visit: Payer: Self-pay | Admitting: Cardiology

## 2012-09-28 ENCOUNTER — Other Ambulatory Visit: Payer: Self-pay | Admitting: *Deleted

## 2012-09-28 MED ORDER — CLOPIDOGREL BISULFATE 75 MG PO TABS: ORAL_TABLET | ORAL | Status: DC

## 2013-01-07 ENCOUNTER — Other Ambulatory Visit: Payer: Self-pay | Admitting: Cardiology

## 2013-01-17 ENCOUNTER — Other Ambulatory Visit: Payer: Self-pay | Admitting: Neurology

## 2013-01-20 ENCOUNTER — Telehealth: Payer: Self-pay | Admitting: *Deleted

## 2013-01-20 ENCOUNTER — Other Ambulatory Visit: Payer: Self-pay | Admitting: Neurology

## 2013-01-20 MED ORDER — CARBAMAZEPINE 200 MG PO TABS: 200.0000 mg | ORAL_TABLET | Freq: Four times a day (QID) | ORAL | Status: DC

## 2013-02-02 ENCOUNTER — Encounter: Payer: Self-pay | Admitting: Cardiology

## 2013-02-02 ENCOUNTER — Ambulatory Visit (INDEPENDENT_AMBULATORY_CARE_PROVIDER_SITE_OTHER): Payer: Medicare Other | Admitting: Cardiology

## 2013-02-02 VITALS — BP 134/70 | HR 56 | Ht 72.0 in | Wt 194.5 lb

## 2013-02-02 DIAGNOSIS — E7800 Pure hypercholesterolemia, unspecified: Secondary | ICD-10-CM

## 2013-02-02 DIAGNOSIS — I2510 Atherosclerotic heart disease of native coronary artery without angina pectoris: Secondary | ICD-10-CM

## 2013-02-02 DIAGNOSIS — I10 Essential (primary) hypertension: Secondary | ICD-10-CM

## 2013-02-02 NOTE — Progress Notes (Signed)
[name] Date of Birth: 1945-10-08 Medical Record #[id number]  History of Present Illness: [name] is seen back today for a followup visit.  He is status post extensive stenting of the LAD with Cypher stent. He had stenting of the right coronary as well. He has a diagonal branch that was jailed by the stents. Followup stress testing has been normal. He reports his blood pressure has been well-controlled. He is concerned about the cost of bystolic. His anginal symptoms have been doing quite well since we increased his dose of amlodipine. He did have an episode of chest pain after working out in the gym that was diagnosed as costochondritis. This resolved with a one-week course of meloxicam.  Current Outpatient Prescriptions on File Prior to Visit  Medication Sig Dispense Refill  . amLODipine (NORVASC) 5 MG tablet Take 1 tablet (5 mg total)  by mouth 2 (two) times  daily  180 tablet  0  . aspirin 81 MG tablet Take 81 mg by mouth daily.        Marland Kitchen atorvastatin (LIPITOR) 40 MG tablet Take 40 mg by mouth daily.        Marland Kitchen BYSTOLIC 20 MG TABS Take 1 tablet by mouth  daily  90 each  3  . clopidogrel (PLAVIX) 75 MG tablet Take 1 tablet by mouth  daily  90 tablet  3  . olmesartan (BENICAR) 40 MG tablet Take 1 tablet (40 mg total) by mouth daily.  90 tablet  3  . valACYclovir (VALTREX) 500 MG tablet Take 500 mg by mouth daily.         No current facility-administered medications on file prior to visit.    Allergies  Allergen Reactions  . Hctz [Hydrochlorothiazide]     Visual changes.    Past Medical History  Diagnosis Date  . Hypertension   . Coronary artery disease     s/p extensive stenting of the mid to distal LAD with 3 Cypher stents and past stenting of the mid RCA. He does have a diagonal branch that was jailed. He has stable angina. Negative myoview in October of 2012; Managed medically.   . Hypercholesterolemia   . Seizure disorder   . Knee pain   . Vagal reaction   . Cervical spine  disease     History of cervical spine disease    Past Surgical History  Procedure Laterality Date  . Hernia repair    . Shoulder arthroscopy      right  . Vasectomy    . Coronary artery stent placement      History  Smoking status  . Former Smoker  . Quit date: 07/03/1965  Smokeless tobacco  . Not on file    History  Alcohol Use No    Family History  Problem Relation Age of Onset  . Heart failure Father 35  . Hyperlipidemia Mother     Review of Systems: The review of systems is per the HPI.  He does have chronic low back pain and is being considered for possible epidural injection. All other systems were reviewed and are negative.  Physical Exam: BP 134/70  Pulse 56  Ht 6' (1.829 m)  Wt 194 lb 8 oz (88.225 kg)  BMI 26.37 kg/m2 Patient is very pleasant and in no acute distress. Skin is warm and dry. Color is normal.  HEENT is unremarkable. Normocephalic/atraumatic. PERRL. Sclera are nonicteric. Neck is supple. No masses. No JVD. Lungs are clear. Cardiac exam shows a regular rate  and rhythm. Abdomen is soft. Extremities are without edema. Gait and ROM are intact. No gross neurologic deficits noted.   LABORATORY DATA: ECG today demonstrates sinus bradycardia with rate 56 beats per minute with occasional PACs. There is voltage criteria for LVH.  Assessment / Plan: 1. Coronary disease with prior extensive stenting of the LAD and right coronaries with first generation Cypher stents. Anginal symptoms have improved with medical therapy. Prior stress testing showed no evidence of ischemia. We discussed the indication for long-term Plavix with first generation coated stents and the extensive nature of his prior stenting procedures. If he needs an epidural he may stop Plavix for one week but should remain on aspirin.  2. Hypertension. Blood pressure is currently well-controlled. If need be we could switch Bystolic to carvedilol for cost. He does not want to make a change at this  time.  3. Hypercholesterolemia. Continue atorvastatin at current dose. Higher doses of statins resulted in myalgias.

## 2013-02-02 NOTE — Patient Instructions (Signed)
Continue your current therapy  I will see you in 6 months.   

## 2013-03-02 LAB — IFOBT (OCCULT BLOOD): IFOBT: NEGATIVE

## 2013-03-08 ENCOUNTER — Telehealth: Payer: Self-pay | Admitting: Cardiology

## 2013-03-08 NOTE — Telephone Encounter (Signed)
New Problem:  Pt states he has questions about his meds and would like a call back.

## 2013-03-08 NOTE — Telephone Encounter (Signed)
Going to be using [facility] starting in January 2015 - he will call back when he is ready to request.

## 2013-03-21 ENCOUNTER — Other Ambulatory Visit: Payer: Self-pay

## 2013-03-21 MED ORDER — CLOPIDOGREL BISULFATE 75 MG PO TABS: ORAL_TABLET | ORAL | Status: DC

## 2013-03-21 MED ORDER — ATORVASTATIN CALCIUM 40 MG PO TABS: 40.0000 mg | ORAL_TABLET | Freq: Every day | ORAL | Status: DC

## 2013-03-21 MED ORDER — NEBIVOLOL HCL 20 MG PO TABS: ORAL_TABLET | ORAL | Status: DC

## 2013-03-21 MED ORDER — OLMESARTAN MEDOXOMIL 40 MG PO TABS: 40.0000 mg | ORAL_TABLET | Freq: Every day | ORAL | Status: DC

## 2013-03-21 MED ORDER — AMLODIPINE BESYLATE 5 MG PO TABS: ORAL_TABLET | ORAL | Status: DC

## 2013-03-22 ENCOUNTER — Telehealth: Payer: Self-pay | Admitting: Neurology

## 2013-03-22 MED ORDER — CARBAMAZEPINE 200 MG PO TABS: 200.0000 mg | ORAL_TABLET | Freq: Four times a day (QID) | ORAL | Status: DC

## 2013-03-22 NOTE — Telephone Encounter (Signed)
Patient walked into the office complaining about the miscommunication with his medication refill requests. [facility] [phone] and fax [phone].  Tegretol 200  Mg (name brand only) .

## 2013-03-22 NOTE — Telephone Encounter (Signed)
I am unsure what the patient is referring to.  Rx has been sent per pt request.  I called him back to advise.  Got no answer.  Left message.

## 2013-03-28 ENCOUNTER — Telehealth: Payer: Self-pay | Admitting: Cardiology

## 2013-03-28 MED ORDER — NEBIVOLOL HCL 10 MG PO TABS: ORAL_TABLET | ORAL | Status: DC

## 2013-03-28 NOTE — Telephone Encounter (Signed)
Returned call to patient he stated he needed a refill for Bystolic.Stated Bystolic 20 mg is on short supply.Stated pharmacy has Bystolic 10 mg.Refill for Bystolic 10 mg sent to pharmacy.

## 2013-03-28 NOTE — Telephone Encounter (Signed)
New Message  Pt is having a problem with (Bystolic) and would like to discuss other options//Please call

## 2013-04-11 ENCOUNTER — Other Ambulatory Visit: Payer: Self-pay

## 2013-04-11 MED ORDER — NEBIVOLOL HCL 10 MG PO TABS: ORAL_TABLET | ORAL | Status: DC

## 2013-04-26 ENCOUNTER — Encounter: Payer: Self-pay | Admitting: Cardiology

## 2013-04-27 ENCOUNTER — Other Ambulatory Visit: Payer: Self-pay | Admitting: Internal Medicine

## 2013-04-27 DIAGNOSIS — R1032 Left lower quadrant pain: Secondary | ICD-10-CM

## 2013-04-28 ENCOUNTER — Ambulatory Visit
Admission: RE | Admit: 2013-04-28 | Discharge: 2013-04-28 | Disposition: A | Discharge status: Home / Self Care | Payer: Medicare Other | Source: Ambulatory Visit | Attending: Internal Medicine | Admitting: Internal Medicine

## 2013-04-28 DIAGNOSIS — R1032 Left lower quadrant pain: Secondary | ICD-10-CM

## 2013-05-02 ENCOUNTER — Encounter: Payer: Self-pay | Admitting: Cardiology

## 2013-05-05 ENCOUNTER — Encounter: Payer: Self-pay | Admitting: Cardiology

## 2013-05-18 NOTE — Telephone Encounter (Signed)
Spoke to patient he stated he received email from my chart needed to get pneumonia and flu vaccines.Patient stated he already got a flu shot this year and he had a pneumonia shot last year.

## 2013-06-13 ENCOUNTER — Telehealth: Payer: Self-pay | Admitting: Cardiology

## 2013-06-13 NOTE — Telephone Encounter (Signed)
New message     Question regarding medication

## 2013-06-13 NOTE — Telephone Encounter (Signed)
Returned call to patient he stated he wanted to know if ok to take a allergy medication.Advised ok to take plain antihistamine no decongestant,plain Zrytec or plain Claritin.

## 2013-08-05 ENCOUNTER — Ambulatory Visit (INDEPENDENT_AMBULATORY_CARE_PROVIDER_SITE_OTHER): Payer: Medicare Other | Admitting: Cardiology

## 2013-08-05 ENCOUNTER — Encounter: Payer: Self-pay | Admitting: Cardiology

## 2013-08-05 ENCOUNTER — Other Ambulatory Visit: Payer: Self-pay

## 2013-08-05 VITALS — BP 189/69 | HR 50 | Ht 72.0 in | Wt 191.0 lb

## 2013-08-05 DIAGNOSIS — I10 Essential (primary) hypertension: Secondary | ICD-10-CM

## 2013-08-05 DIAGNOSIS — E7800 Pure hypercholesterolemia, unspecified: Secondary | ICD-10-CM

## 2013-08-05 DIAGNOSIS — I2510 Atherosclerotic heart disease of native coronary artery without angina pectoris: Secondary | ICD-10-CM

## 2013-08-05 MED ORDER — CLOPIDOGREL BISULFATE 75 MG PO TABS: ORAL_TABLET | ORAL | Status: DC

## 2013-08-05 NOTE — Progress Notes (Signed)
[name] Date of Birth: 07/09/1945 Medical Record #[id number]  History of Present Illness: [name] is seen back today for a followup visit.  He is status post extensive stenting of the LAD with Cypher stent. He had stenting of the right coronary as well. He has a diagonal branch that was jailed by the stents. Followup stress testing has been normal.  His anginal symptoms have been doing quite well since we increased his dose of amlodipine. He has rare symptoms of chest pain. He remains very active. He did not take his BP meds this am.   Current Outpatient Prescriptions on File Prior to Visit  Medication Sig Dispense Refill  . amLODipine (NORVASC) 5 MG tablet Take 1 tablet (5 mg total)  by mouth 2 (two) times  daily  180 tablet  1  . aspirin 81 MG tablet Take 81 mg by mouth daily.        Marland Kitchen atorvastatin (LIPITOR) 40 MG tablet Take 1 tablet (40 mg total) by mouth daily.  90 tablet  1  . clopidogrel (PLAVIX) 75 MG tablet Take 1 tablet by mouth  daily  90 tablet  1  . nebivolol (BYSTOLIC) 10 MG tablet Take 2 tablets daily ( 20 mg )  180 tablet  3  . olmesartan (BENICAR) 40 MG tablet Take 1 tablet (40 mg total) by mouth daily.  90 tablet  1  . valACYclovir (VALTREX) 500 MG tablet Take 500 mg by mouth daily.         No current facility-administered medications on file prior to visit.    Allergies  Allergen Reactions  . Hctz [Hydrochlorothiazide]     Visual changes.    Past Medical History  Diagnosis Date  . Hypertension   . Coronary artery disease     s/p extensive stenting of the mid to distal LAD with 3 Cypher stents and past stenting of the mid RCA. He does have a diagonal branch that was jailed. He has stable angina. Negative myoview in October of 2012; Managed medically.   . Hypercholesterolemia   . Seizure disorder   . Knee pain   . Vagal reaction   . Cervical spine disease     History of cervical spine disease    Past Surgical History  Procedure Laterality Date  . Hernia  repair    . Shoulder arthroscopy      right  . Vasectomy    . Coronary artery stent placement      History  Smoking status  . Former Smoker  . Quit date: 07/03/1965  Smokeless tobacco  . Not on file    History  Alcohol Use No    Family History  Problem Relation Age of Onset  . Heart failure Father 61  . Hyperlipidemia Mother     Review of Systems: The review of systems is per the HPI.  . All other systems were reviewed and are negative.  Physical Exam: BP 189/69  Pulse 50  Ht 6' (1.829 m)  Wt 191 lb (86.637 kg)  BMI 25.90 kg/m2 Patient is very pleasant and in no acute distress. Skin is warm and dry. Color is normal.  HEENT is unremarkable. Normocephalic/atraumatic. PERRL. Sclera are nonicteric. Neck is supple. No masses. No JVD. Lungs are clear. Cardiac exam shows a regular rate and rhythm. Abdomen is soft. Extremities are without edema. Gait and ROM are intact. No gross neurologic deficits noted.   LABORATORY DATA:   Assessment / Plan: 1. Coronary disease with prior  extensive stenting of the LAD and right coronaries with first generation Cypher stents. Anginal symptoms are stable on medical therapy. Prior stress testing showed no evidence of ischemia. We discussed the indication for long-term Plavix with first generation coated stents and the extensive nature of his prior stenting procedures. I will follow up in 6 months and plan on a stress test at that time.  2. Hypertension. Blood pressure is quite high today. Did not take meds this am. Will take meds and repeat BP check at home.   3. Hypercholesterolemia. Continue atorvastatin at current dose. Higher doses of statins resulted in myalgias. Will request last lab work from Dr. Reynaldo Minium.

## 2013-08-05 NOTE — Patient Instructions (Signed)
Continue your current therapy  I will see you in 6 months.   

## 2013-08-12 ENCOUNTER — Other Ambulatory Visit: Payer: Self-pay

## 2013-08-12 MED ORDER — OLMESARTAN MEDOXOMIL 40 MG PO TABS: 40.0000 mg | ORAL_TABLET | Freq: Every day | ORAL | Status: DC

## 2013-08-26 ENCOUNTER — Other Ambulatory Visit: Payer: Self-pay | Admitting: *Deleted

## 2013-08-26 MED ORDER — AMLODIPINE BESYLATE 5 MG PO TABS: ORAL_TABLET | ORAL | Status: DC

## 2013-08-26 MED ORDER — ATORVASTATIN CALCIUM 40 MG PO TABS: 40.0000 mg | ORAL_TABLET | Freq: Every day | ORAL | Status: DC

## 2013-09-01 ENCOUNTER — Other Ambulatory Visit: Payer: Self-pay

## 2013-09-01 MED ORDER — CARBAMAZEPINE 200 MG PO TABS: 200.0000 mg | ORAL_TABLET | Freq: Four times a day (QID) | ORAL | Status: DC

## 2013-09-28 ENCOUNTER — Ambulatory Visit (INDEPENDENT_AMBULATORY_CARE_PROVIDER_SITE_OTHER): Payer: Medicare Other | Admitting: Neurology

## 2013-09-28 ENCOUNTER — Encounter: Payer: Self-pay | Admitting: Neurology

## 2013-09-28 VITALS — BP 157/66 | HR 57 | Wt 195.0 lb

## 2013-09-28 DIAGNOSIS — Z5181 Encounter for therapeutic drug level monitoring: Secondary | ICD-10-CM

## 2013-09-28 DIAGNOSIS — I2510 Atherosclerotic heart disease of native coronary artery without angina pectoris: Secondary | ICD-10-CM

## 2013-09-28 DIAGNOSIS — G40909 Epilepsy, unspecified, not intractable, without status epilepticus: Secondary | ICD-10-CM

## 2013-09-28 DIAGNOSIS — D513 Other dietary vitamin B12 deficiency anemia: Secondary | ICD-10-CM

## 2013-09-28 DIAGNOSIS — D518 Other vitamin B12 deficiency anemias: Secondary | ICD-10-CM

## 2013-09-28 DIAGNOSIS — R209 Unspecified disturbances of skin sensation: Secondary | ICD-10-CM

## 2013-09-28 NOTE — Progress Notes (Signed)
Reason for visit: Seizures  [name] is an 68 y.o. male  History of present illness:  [name] is a 68 year old right-handed white male with a history of seizures that are under good control with the use of carbamazepine. The patient operates a motor vehicle, and he does well with this. Over a number of years, dating back to around 2000, he has noted some numbness of the feet that has gradually worsened. The patient denies any severe change in balance, but he does have slight imbalance problems. The patient has a history of chronic neck and low back pain. The patient has some mild cervical spinal stenosis and lower thoracic spinal stenosis. The patient last had MRI evaluation of the spine in 2005. The patient denies any pain radiating down the legs. He denies problems controlling the bowels or the bladder. He returns to this office for an evaluation.  Past Medical History  Diagnosis Date  . Hypertension   . Coronary artery disease     s/p extensive stenting of the mid to distal LAD with 3 Cypher stents and past stenting of the mid RCA. He does have a diagonal branch that was jailed. He has stable angina. Negative myoview in October of 2012; Managed medically.   . Hypercholesterolemia   . Seizure disorder   . Knee pain   . Vagal reaction   . Cervical spine disease     History of cervical spine disease    Past Surgical History  Procedure Laterality Date  . Hernia repair    . Shoulder arthroscopy      right  . Vasectomy    . Coronary artery stent placement      Family History  Problem Relation Age of Onset  . Heart failure Father 76  . Hyperlipidemia Mother     Social history:  reports that he quit smoking about 48 years ago. He has never used smokeless tobacco. He reports that he does not drink alcohol or use illicit drugs.    Allergies  Allergen Reactions  . Hctz [Hydrochlorothiazide]     Visual changes.    Medications:  Current Outpatient Prescriptions on  File Prior to Visit  Medication Sig Dispense Refill  . amLODipine (NORVASC) 5 MG tablet Take 1 tablet (5 mg total)  by mouth 2 (two) times  daily  180 tablet  1  . aspirin 81 MG tablet Take 81 mg by mouth daily.        Marland Kitchen atorvastatin (LIPITOR) 40 MG tablet Take 1 tablet (40 mg total) by mouth daily.  90 tablet  1  . carbamazepine (TEGRETOL) 200 MG tablet Take 1 tablet (200 mg total) by mouth 4 (four) times daily. Brand Medically Necessary  360 tablet  0  . clopidogrel (PLAVIX) 75 MG tablet Take 1 tablet by mouth  daily  90 tablet  1  . nebivolol (BYSTOLIC) 10 MG tablet Take 2 tablets daily ( 20 mg )  180 tablet  3  . olmesartan (BENICAR) 40 MG tablet Take 1 tablet (40 mg total) by mouth daily.  90 tablet  1  . valACYclovir (VALTREX) 500 MG tablet Take 500 mg by mouth daily.         No current facility-administered medications on file prior to visit.    ROS:  Out of a complete 14 system review of symptoms, the patient complains only of the following symptoms, and all other reviewed systems are negative.  Bruising easily Back pain Numbness of the feet  Blood pressure 157/66, pulse 57, weight 195 lb (88.451 kg).  Physical Exam  General: The patient is alert and cooperative at the time of the examination.  Skin: No significant peripheral edema is noted.   Neurologic Exam  Mental status: The patient is oriented x 3.  Cranial nerves: Facial symmetry is present. Speech is normal, no aphasia or dysarthria is noted. Extraocular movements are full. Visual fields are full.  Motor: The patient has good strength in all 4 extremities.  Sensory examination: There is no evidence of a pinprick sensory deficit in the lower extremities. Position sensation and vibration sensation are symmetric and normal.  Coordination: The patient has good finger-nose-finger and heel-to-shin bilaterally.  Gait and station: The patient has a normal gait. Tandem gait is normal. Romberg is negative. No drift is  seen.  Reflexes: Deep tendon reflexes are symmetric, but are depressed.   07/05/03 MRI thoracic:  IMPRESSION  1. Please note that the numbering terminology when counting down from the skull base is different than that obtained when counting up from the sacrum. See above for full discussion.  2. Small disk protrusion at T7-8 not likely of any significance.  3. Mild facet degeneration bilaterally at T10-11 with mild encroachment upon the neural foramina, not grossly compressive.  4. Disk bulge at T11-12. Severe hypertrophic facet degenerative disease at this level with encroachment upon the spinal canal such that the subarachnoid space surrounding the cord is effaced and the cord is deformed.  5. Disk bulge at T12-L1 with mild facet degenerative change. No compressive stenosis suspected.    MRI lumbosacral spine 06/16/2003:  IMPRESSION:  1. There is moderate spinal stenosis at T10-11 and mild stenosis at T11-12 due to disc bulging, spondylosis, and some facet overgrowth. There is no compression of the cord.  2 Negative for disc protrusion.   MRI cervical spine 11/24/1997:  IMPRESSION:  1. C5-6 MILD DIFFUSE BULGE (MINIMALLY MORE PROMINENT IN THE RIGHT POSTEROLATERAL DIRECTION) IS  CAUSING MILD SPINAL STENOSIS. MINIMAL BILATERAL C5-6 NEURAL FORAMINAL NARROWING.  2. MINIMAL RIGHT-SIDED C3-4 AND C4-5 NEURAL FORAMINAL NARROWING.  3. MINIMAL BULGE AT C4-5 AND C6-7.         Assessment/Plan:  One. History seizures  2. Chronic low back pain  3. Numbness of the feet  The patient could have a mild peripheral neuropathy, but he does have some lower thoracic spinal stenosis at the T11-12 level. If the symptoms significantly worsen, a repeat MRI of the lower thoracic area may be indicated. The patient will have some blood work done today to include a B12 level. He will followup in one year. He is to remain on the carbamazepine at the current dose.  Jill Alexanders MD 09/28/2013 3:08  PM  [facility] [address] [place], [place] [address]  Phone [phone] Fax [phone]

## 2013-09-28 NOTE — Patient Instructions (Signed)

## 2013-09-30 LAB — CBC WITH DIFFERENTIAL
Basophils Absolute: 0 10*3/uL (ref 0.0–0.2)
Basos: 0 %
EOS ABS: 0.1 10*3/uL (ref 0.0–0.4)
Eos: 1 %
HEMATOCRIT: 36.8 % — AB (ref 37.5–51.0)
HEMOGLOBIN: 12.6 g/dL (ref 12.6–17.7)
IMMATURE GRANS (ABS): 0.1 10*3/uL (ref 0.0–0.1)
Immature Granulocytes: 1 %
LYMPHS: 29 %
Lymphocytes Absolute: 1.7 10*3/uL (ref 0.7–3.1)
MCH: 29.2 pg (ref 26.6–33.0)
MCHC: 34.2 g/dL (ref 31.5–35.7)
MCV: 85 fL (ref 79–97)
MONOCYTES: 10 %
Monocytes Absolute: 0.6 10*3/uL (ref 0.1–0.9)
NEUTROS ABS: 3.5 10*3/uL (ref 1.4–7.0)
NEUTROS PCT: 59 %
Platelets: 182 10*3/uL (ref 150–379)
RBC: 4.32 x10E6/uL (ref 4.14–5.80)
RDW: 14.8 % (ref 12.3–15.4)
WBC: 5.9 10*3/uL (ref 3.4–10.8)

## 2013-09-30 LAB — COMPREHENSIVE METABOLIC PANEL
ALT: 18 IU/L (ref 0–44)
AST: 22 IU/L (ref 0–40)
Albumin/Globulin Ratio: 2.1 (ref 1.1–2.5)
Albumin: 4.2 g/dL (ref 3.6–4.8)
Alkaline Phosphatase: 95 IU/L (ref 39–117)
BUN/Creatinine Ratio: 20 (ref 10–22)
BUN: 22 mg/dL (ref 8–27)
CALCIUM: 8.6 mg/dL (ref 8.6–10.2)
CHLORIDE: 105 mmol/L (ref 97–108)
CO2: 24 mmol/L (ref 18–29)
Creatinine, Ser: 1.12 mg/dL (ref 0.76–1.27)
GFR calc Af Amer: 77 mL/min/{1.73_m2} (ref >59)
GFR calc non Af Amer: 67 mL/min/{1.73_m2} (ref >59)
GLUCOSE: 89 mg/dL (ref 65–99)
Globulin, Total: 2 g/dL (ref 1.5–4.5)
POTASSIUM: 4 mmol/L (ref 3.5–5.2)
SODIUM: 145 mmol/L — AB (ref 134–144)
TOTAL PROTEIN: 6.2 g/dL (ref 6.0–8.5)
Total Bilirubin: 0.2 mg/dL (ref 0.0–1.2)

## 2013-09-30 LAB — VITAMIN B12: VITAMIN B 12: 472 pg/mL (ref 211–946)

## 2013-09-30 LAB — CARBAMAZEPINE LEVEL, TOTAL: Carbamazepine Lvl: 10.4 ug/mL (ref 4.0–12.0)

## 2013-09-30 LAB — COPPER, SERUM: COPPER: 104 ug/dL (ref 72–166)

## 2013-09-30 NOTE — Progress Notes (Signed)
Quick Note:  I called and gave message to pt that labs were ok. He verbalized understanding. ______

## 2013-10-26 ENCOUNTER — Encounter (HOSPITAL_COMMUNITY): Payer: Self-pay | Admitting: Emergency Medicine

## 2013-10-26 DIAGNOSIS — I2510 Atherosclerotic heart disease of native coronary artery without angina pectoris: Secondary | ICD-10-CM | POA: Insufficient documentation

## 2013-10-26 DIAGNOSIS — G40909 Epilepsy, unspecified, not intractable, without status epilepticus: Secondary | ICD-10-CM | POA: Diagnosis not present

## 2013-10-26 DIAGNOSIS — E7800 Pure hypercholesterolemia, unspecified: Secondary | ICD-10-CM | POA: Diagnosis not present

## 2013-10-26 DIAGNOSIS — Z7902 Long term (current) use of antithrombotics/antiplatelets: Secondary | ICD-10-CM | POA: Insufficient documentation

## 2013-10-26 DIAGNOSIS — Z7982 Long term (current) use of aspirin: Secondary | ICD-10-CM | POA: Insufficient documentation

## 2013-10-26 DIAGNOSIS — Z9861 Coronary angioplasty status: Secondary | ICD-10-CM | POA: Insufficient documentation

## 2013-10-26 DIAGNOSIS — Z87891 Personal history of nicotine dependence: Secondary | ICD-10-CM | POA: Insufficient documentation

## 2013-10-26 DIAGNOSIS — I10 Essential (primary) hypertension: Secondary | ICD-10-CM | POA: Diagnosis present

## 2013-10-26 DIAGNOSIS — Z79899 Other long term (current) drug therapy: Secondary | ICD-10-CM | POA: Insufficient documentation

## 2013-10-26 LAB — CBC
HCT: 39.5 % (ref 39.0–52.0)
Hemoglobin: 13.2 g/dL (ref 13.0–17.0)
MCH: 29.5 pg (ref 26.0–34.0)
MCHC: 33.4 g/dL (ref 30.0–36.0)
MCV: 88.2 fL (ref 78.0–100.0)
PLATELETS: 152 10*3/uL (ref 150–400)
RBC: 4.48 MIL/uL (ref 4.22–5.81)
RDW: 13.5 % (ref 11.5–15.5)
WBC: 5.6 10*3/uL (ref 4.0–10.5)

## 2013-10-26 LAB — I-STAT TROPONIN, ED: TROPONIN I, POC: 0.01 ng/mL (ref 0.00–0.08)

## 2013-10-26 LAB — BASIC METABOLIC PANEL
ANION GAP: 13 (ref 5–15)
BUN: 20 mg/dL (ref 6–23)
CALCIUM: 8.7 mg/dL (ref 8.4–10.5)
CO2: 26 meq/L (ref 19–32)
CREATININE: 1.02 mg/dL (ref 0.50–1.35)
Chloride: 107 mEq/L (ref 96–112)
GFR, EST AFRICAN AMERICAN: 85 mL/min — AB (ref >90)
GFR, EST NON AFRICAN AMERICAN: 73 mL/min — AB (ref >90)
Glucose, Bld: 108 mg/dL — ABNORMAL HIGH (ref 70–99)
Potassium: 3.7 mEq/L (ref 3.7–5.3)
SODIUM: 146 meq/L (ref 137–147)

## 2013-10-26 NOTE — ED Notes (Signed)
Pt presents from home with c/o high BP. Pt reports taking his BP at home, at [facility], and [facility], all 3 machines read "200/90 something". Pt reports intermittent mild headache x5 days. BP is 211/87 at triage.

## 2013-10-27 ENCOUNTER — Telehealth: Payer: Self-pay | Admitting: Cardiology

## 2013-10-27 ENCOUNTER — Emergency Department (HOSPITAL_COMMUNITY)
Admission: EM | Admit: 2013-10-27 | Discharge: 2013-10-27 | Disposition: A | Discharge status: Home / Self Care | Payer: Medicare Other | Attending: Emergency Medicine | Admitting: Emergency Medicine

## 2013-10-27 DIAGNOSIS — I160 Hypertensive urgency: Secondary | ICD-10-CM

## 2013-10-27 MED ORDER — CLONIDINE HCL 0.2 MG PO TABS
0.2000 mg | ORAL_TABLET | Freq: Once | ORAL | Status: AC
  Administered 2013-10-27: 0.2 mg via ORAL
  Filled 2013-10-27: qty 1

## 2013-10-27 MED ORDER — CLONIDINE HCL 0.1 MG PO TABS: 0.1000 mg | ORAL_TABLET | Freq: Two times a day (BID) | ORAL | Status: DC

## 2013-10-27 NOTE — Discharge Instructions (Signed)
Continue your medications as before. Add clonidine 0.1 mg twice daily.  Keep a record of your blood pressures over the next several days and followup these results with your primary Dr.  Return to the emergency department if he developed chest pain, difficulty breathing, or other new and concerning symptoms.   Hypertension Hypertension, commonly called high blood pressure, is when the force of blood pumping through your arteries is too strong. Your arteries are the blood vessels that carry blood from your heart throughout your body. A blood pressure reading consists of a higher number over a lower number, such as 110/72. The higher number (systolic) is the pressure inside your arteries when your heart pumps. The lower number (diastolic) is the pressure inside your arteries when your heart relaxes. Ideally you want your blood pressure below 120/80. Hypertension forces your heart to work harder to pump blood. Your arteries may become narrow or stiff. Having hypertension puts you at risk for heart disease, stroke, and other problems.  RISK FACTORS Some risk factors for high blood pressure are controllable. Others are not.  Risk factors you cannot control include:   Race. You may be at higher risk if you are African American.  Age. Risk increases with age.  Gender. Men are at higher risk than women before age 29 years. After age 67, women are at higher risk than men. Risk factors you can control include:  Not getting enough exercise or physical activity.  Being overweight.  Getting too much fat, sugar, calories, or salt in your diet.  Drinking too much alcohol. SIGNS AND SYMPTOMS Hypertension does not usually cause signs or symptoms. Extremely high blood pressure (hypertensive crisis) may cause headache, anxiety, shortness of breath, and nosebleed. DIAGNOSIS  To check if you have hypertension, your health care provider will measure your blood pressure while you are seated, with your arm  held at the level of your heart. It should be measured at least twice using the same arm. Certain conditions can cause a difference in blood pressure between your right and left arms. A blood pressure reading that is higher than normal on one occasion does not mean that you need treatment. If one blood pressure reading is high, ask your health care provider about having it checked again. TREATMENT  Treating high blood pressure includes making lifestyle changes and possibly taking medicine. Living a healthy lifestyle can help lower high blood pressure. You may need to change some of your habits. Lifestyle changes may include:  Following the DASH diet. This diet is high in fruits, vegetables, and whole grains. It is low in salt, red meat, and added sugars.  Getting at least 2 hours of brisk physical activity every week.  Losing weight if necessary.  Not smoking.  Limiting alcoholic beverages.  Learning ways to reduce stress. If lifestyle changes are not enough to get your blood pressure under control, your health care provider may prescribe medicine. You may need to take more than one. Work closely with your health care provider to understand the risks and benefits. HOME CARE INSTRUCTIONS  Have your blood pressure rechecked as directed by your health care provider.   Take medicines only as directed by your health care provider. Follow the directions carefully. Blood pressure medicines must be taken as prescribed. The medicine does not work as well when you skip doses. Skipping doses also puts you at risk for problems.   Do not smoke.   Monitor your blood pressure at home as directed by your health  care provider. SEEK MEDICAL CARE IF:   You think you are having a reaction to medicines taken.  You have recurrent headaches or feel dizzy.  You have swelling in your ankles.  You have trouble with your vision. SEEK IMMEDIATE MEDICAL CARE IF:  You develop a severe headache or  confusion.  You have unusual weakness, numbness, or feel faint.  You have severe chest or abdominal pain.  You vomit repeatedly.  You have trouble breathing. MAKE SURE YOU:   Understand these instructions.  Will watch your condition.  Will get help right away if you are not doing well or get worse. Document Released: 03/03/2005 Document Revised: 07/18/2013 Document Reviewed: 12/24/2012 [facility] Patient Information 2015 [place], [place]. This information is not intended to replace advice given to you by your health care provider. Make sure you discuss any questions you have with your health care provider.

## 2013-10-27 NOTE — Telephone Encounter (Signed)
Returned call to patient unable to talk.Stated is on important phone call and needs to take care of first.Stated he wanted me to call him back in 30 mins.

## 2013-10-27 NOTE — ED Notes (Signed)
Pt A&OX4, ambulatory at d/c with steady gait, NAD 

## 2013-10-27 NOTE — Telephone Encounter (Signed)
New Prob   Pt states his blood pressure ran high last night. States he went to the ED and would like to discuss his visit with a nurse. Please call.

## 2013-10-27 NOTE — ED Notes (Signed)
Dr. Delo at bedside. 

## 2013-10-28 NOTE — ED Provider Notes (Signed)
CSN: [id number]     Arrival date & time 10/26/13  2144 History   First MD Initiated Contact with Patient 10/27/13 0055     Chief Complaint  Patient presents with  . Hypertension     (Consider location/radiation/quality/duration/timing/severity/associated sxs/prior Treatment) HPI Comments: Patient is a 68 year old male with history of hypertension and coronary artery disease. He presents with complaints of elevated blood pressure. He checked it at home and was found to be over 200. He then went to different pharmacies and got similar readings. He is not complaining of any chest pain, headache, difficulty breathing, or other symptoms.  Patient is a 68 y.o. male presenting with hypertension. The history is provided by the patient.  Hypertension This is a new problem. The current episode started yesterday. The problem occurs constantly. Pertinent negatives include no chest pain, no abdominal pain, no headaches and no shortness of breath. Nothing aggravates the symptoms. Nothing relieves the symptoms. He has tried nothing for the symptoms. The treatment provided no relief.    Past Medical History  Diagnosis Date  . Hypertension   . Coronary artery disease     s/p extensive stenting of the mid to distal LAD with 3 Cypher stents and past stenting of the mid RCA. He does have a diagonal branch that was jailed. He has stable angina. Negative myoview in October of 2012; Managed medically.   . Hypercholesterolemia   . Seizure disorder   . Knee pain   . Vagal reaction   . Cervical spine disease     History of cervical spine disease   Past Surgical History  Procedure Laterality Date  . Shoulder arthroscopy      right  . Vasectomy    . Coronary artery stent placement    . Hernia repair      right groin   Family History  Problem Relation Age of Onset  . Heart failure Father 29  . Hyperlipidemia Mother    History  Substance Use Topics  . Smoking status: Former Smoker    Quit date:  07/03/1965  . Smokeless tobacco: Never Used  . Alcohol Use: No    Review of Systems  Respiratory: Negative for shortness of breath.   Cardiovascular: Negative for chest pain.  Gastrointestinal: Negative for abdominal pain.  Neurological: Negative for headaches.  All other systems reviewed and are negative.     Allergies  Hctz  Home Medications   Prior to Admission medications   Medication Sig Start Date End Date Taking? Authorizing Provider  amLODipine (NORVASC) 5 MG tablet Take 1 tablet (5 mg total)  by mouth 2 (two) times  daily 08/26/13   Peter M Martinique, MD  aspirin 81 MG tablet Take 81 mg by mouth daily.      Historical Provider, MD  atorvastatin (LIPITOR) 40 MG tablet Take 1 tablet (40 mg total) by mouth daily. 08/26/13   Peter M Martinique, MD  carbamazepine (TEGRETOL) 200 MG tablet Take 1 tablet (200 mg total) by mouth 4 (four) times daily. Brand Medically Necessary 09/01/13   Kathrynn Ducking, MD  cloNIDine (CATAPRES) 0.1 MG tablet Take 1 tablet (0.1 mg total) by mouth 2 (two) times daily. 10/27/13   Veryl Speak, MD  clopidogrel (PLAVIX) 75 MG tablet Take 1 tablet by mouth  daily 08/05/13   Peter M Martinique, MD  nebivolol (BYSTOLIC) 10 MG tablet Take 2 tablets daily ( 20 mg ) 04/11/13   Peter M Martinique, MD  olmesartan (BENICAR) 40 MG tablet Take 1  tablet (40 mg total) by mouth daily. 08/12/13   Peter M Martinique, MD  valACYclovir (VALTREX) 500 MG tablet Take 500 mg by mouth daily.      Historical Provider, MD   BP 153/67  Pulse 52  Temp(Src) 98.4 F (36.9 C) (Oral)  Resp 11  Ht 6\' 1"  (1.854 m)  Wt 190 lb (86.183 kg)  BMI 25.07 kg/m2  SpO2 99% Physical Exam  Nursing note and vitals reviewed. Constitutional: He is oriented to person, place, and time. He appears well-developed and well-nourished. No distress.  Awake, alert, nontoxic appearance.  HENT:  Head: Normocephalic and atraumatic.  Mouth/Throat: Oropharynx is clear and moist.  Eyes: EOM are normal. Pupils are equal, round,  and reactive to light.  Neck: Neck supple.  Pulmonary/Chest: Effort normal. No respiratory distress. He exhibits no tenderness.  Abdominal: Soft. There is no tenderness. There is no rebound.  Musculoskeletal: He exhibits no tenderness.  Baseline ROM, no obvious new focal weakness.  Neurological: He is alert and oriented to person, place, and time. No cranial nerve deficit. Coordination normal.  Mental status and motor strength appears baseline for patient and situation.  Skin: No rash noted. He is not diaphoretic.  Psychiatric: He has a normal mood and affect.    ED Course  Procedures (including critical care time) Labs Review Labs Reviewed  BASIC METABOLIC PANEL - Abnormal; Notable for the following:    Glucose, Bld 108 (*)    GFR calc non Af Amer 73 (*)    GFR calc Af Amer 85 (*)    All other components within normal limits  CBC  I-STAT TROPOININ, ED    Imaging Review No results found.   EKG Interpretation   Date/Time:  Wednesday October 26 2013 22:23:24 EDT Ventricular Rate:  53 PR Interval:  194 QRS Duration: 108 QT Interval:  434 QTC Calculation: 407 R Axis:   -61 Text Interpretation:  Sinus bradycardia Possible Left atrial enlargement  Incomplete right bundle branch block Left anterior fascicular block Left  ventricular hypertrophy Septal infarct , age undetermined Abnormal ECG  similar previous, poor baseline Confirmed by ZAVITZ  MD, JOSHUA ([id number]) on  10/26/2013 10:24:33 PM      MDM   Final diagnoses:  Hypertensive urgency    Workup reveals no evidence for endorgan damage and he is neurologically intact. He was given 0.2 mg of clonidine and his blood pressure responded nicely. I will start him on additional doses of clonidine and have him follow his blood pressure for the next several days. He is to followup with his primary Dr. to discuss the trend in his blood pressures.    Veryl Speak, MD 10/28/13 [phone]

## 2013-10-28 NOTE — Telephone Encounter (Signed)
Message sent to [facility].

## 2013-10-31 ENCOUNTER — Telehealth: Payer: Self-pay | Admitting: Cardiology

## 2013-10-31 NOTE — Telephone Encounter (Signed)
New problem   Pt need to speak to you concerning a medication change. Please call pt.

## 2013-10-31 NOTE — Telephone Encounter (Signed)
Returned call to patient he stated his B/P has been elevated.Stated he went to [facility] ER last week.Stated he was prescribed Clonidine 0.1 mg twice a day.Stated made him so drowsy today he decreased to 0.1 mg 1/2 tablet twice a day and he feels better.Stated he wanted to make sure this was ok.B/P has been 211 to 173 systolic.50 to 60 diastolic.Stated he made appointment with PCP Dr.Aronson this week, but wanted to let Dr.Jordan know.Stated he is confused which Dr.to manage his B/P.Will talk to [place] 11/02/13 and call him back.

## 2013-10-31 NOTE — Telephone Encounter (Signed)
See previous 10/31/13 note.

## 2013-11-03 NOTE — Telephone Encounter (Signed)
Returned call to patient Dr.Jordan advised ok for PCP to manage B/P.

## 2013-11-24 ENCOUNTER — Other Ambulatory Visit: Payer: Self-pay

## 2013-11-24 MED ORDER — CARBAMAZEPINE 200 MG PO TABS: 200.0000 mg | ORAL_TABLET | Freq: Four times a day (QID) | ORAL | Status: DC

## 2013-12-14 ENCOUNTER — Encounter: Payer: Self-pay | Admitting: Internal Medicine

## 2013-12-30 ENCOUNTER — Other Ambulatory Visit: Payer: Self-pay

## 2014-01-23 ENCOUNTER — Telehealth: Payer: Self-pay | Admitting: Neurology

## 2014-01-23 NOTE — Telephone Encounter (Signed)
I called the patient. The patient is changing his insurance, he is on brand-name Tegretol currently. He has been well controlled on this and he likely could switch to generic if he wishes. The patient will call me around the first of the year to determine what he wants to do. The patient does go to generic, we will need blood work within 2 weeks after the switch.

## 2014-01-23 NOTE — Telephone Encounter (Signed)
Patient is calling. He has a question about medication Tegretol. Please call.

## 2014-01-23 NOTE — Telephone Encounter (Signed)
I called back.  Spoke with patient at great length.  [name] he is going to be changing ins in Jan.  The current ins he is looking into does not have Tegretol on the formulary.  They do however have Carbamazepine (Tier 1) and Tegretol XR 100 (Tier 3).  Before choosing this plan, patient would like to know if Dr Jannifer Franklin would be comfortable changing him from Brand Name Tegretol to Generic Carbamazepine or Tegretol XR 100.  For financial reasons, he would prefer Carbamazepine, but will go with whatever provider would be agreeable to.  Says a new Rx would not be needed until next year, but he as to sign up for the plan soon.  Please advise.  Thank you.

## 2014-02-03 ENCOUNTER — Encounter: Payer: Self-pay | Admitting: Cardiology

## 2014-02-03 ENCOUNTER — Other Ambulatory Visit: Payer: Self-pay

## 2014-02-03 ENCOUNTER — Ambulatory Visit (INDEPENDENT_AMBULATORY_CARE_PROVIDER_SITE_OTHER): Payer: Medicare Other | Admitting: Cardiology

## 2014-02-03 VITALS — BP 158/74 | HR 52 | Ht 73.0 in | Wt 198.1 lb

## 2014-02-03 DIAGNOSIS — I2510 Atherosclerotic heart disease of native coronary artery without angina pectoris: Secondary | ICD-10-CM

## 2014-02-03 DIAGNOSIS — E7800 Pure hypercholesterolemia, unspecified: Secondary | ICD-10-CM

## 2014-02-03 DIAGNOSIS — I10 Essential (primary) hypertension: Secondary | ICD-10-CM

## 2014-02-03 MED ORDER — OLMESARTAN MEDOXOMIL 40 MG PO TABS: 40.0000 mg | ORAL_TABLET | Freq: Every day | ORAL | Status: DC

## 2014-02-03 MED ORDER — NEBIVOLOL HCL 20 MG PO TABS: 20.0000 | ORAL_TABLET | Freq: Every day | ORAL | Status: DC

## 2014-02-03 MED ORDER — CLOPIDOGREL BISULFATE 75 MG PO TABS: ORAL_TABLET | ORAL | Status: DC

## 2014-02-03 MED ORDER — AMLODIPINE BESYLATE 5 MG PO TABS: ORAL_TABLET | ORAL | Status: DC

## 2014-02-03 MED ORDER — NEBIVOLOL HCL 20 MG PO TABS: ORAL_TABLET | ORAL | Status: DC

## 2014-02-03 MED ORDER — ATORVASTATIN CALCIUM 40 MG PO TABS: 40.0000 mg | ORAL_TABLET | Freq: Every day | ORAL | Status: DC

## 2014-02-03 NOTE — Patient Instructions (Signed)
We will schedule you for a nuclear stress test.  Keep an eye on your blood pressure  I will see you in 6 months.

## 2014-02-04 NOTE — Progress Notes (Signed)
[name] Date of Birth: [birth year] Medical Record #[id number]  History of Present Illness: [name] is seen back today for a followup visit.  He is status post extensive stenting of the LAD with Cypher stents in the past. He had stenting of the right coronary as well. He has a diagonal branch that was jailed by the stents. Followup stress testing has been normal last in 2012.  He denies any SOB. He has rare anginal symptoms. He does complain of easy bruising in his hands. He does complain of arthritic pain and takes NSAIDs occasionally. He remains active.   Current Outpatient Prescriptions on File Prior to Visit  Medication Sig Dispense Refill  . aspirin 81 MG tablet Take 81 mg by mouth daily.      . carbamazepine (TEGRETOL) 200 MG tablet Take 1 tablet (200 mg total) by mouth 4 (four) times daily. Brand Medically Necessary 360 tablet 3  . valACYclovir (VALTREX) 500 MG tablet Take 500 mg by mouth daily.       No current facility-administered medications on file prior to visit.    Allergies  Allergen Reactions  . Hctz [Hydrochlorothiazide]     Visual changes.    Past Medical History  Diagnosis Date  . Hypertension   . Coronary artery disease     s/p extensive stenting of the mid to distal LAD with 3 Cypher stents and past stenting of the mid RCA. He does have a diagonal branch that was jailed. He has stable angina. Negative myoview in October of 2012; Managed medically.   . Hypercholesterolemia   . Seizure disorder   . Knee pain   . Vagal reaction   . Cervical spine disease     History of cervical spine disease    Past Surgical History  Procedure Laterality Date  . Shoulder arthroscopy      right  . Vasectomy    . Coronary artery stent placement    . Hernia repair      right groin    History  Smoking status  . Former Smoker  . Quit date: 07/03/1965  Smokeless tobacco  . Never Used    History  Alcohol Use No    Family History  Problem Relation Age of Onset   . Heart failure Father 19  . Hyperlipidemia Mother     Review of Systems: The review of systems is per the HPI.  . All other systems were reviewed and are negative.  Physical Exam: BP 158/74 mmHg  Pulse 52  Ht 6\' 1"  (1.854 m)  Wt 198 lb 1.6 oz (89.858 kg)  BMI 26.14 kg/m2 Patient is very pleasant and in no acute distress. Skin is warm and dry. Color is normal.  HEENT is unremarkable. Normocephalic/atraumatic. PERRL. Sclera are nonicteric. Neck is supple. No masses. No JVD. Lungs are clear. Cardiac exam shows a regular rate and rhythm. Normal S1-2. No gallop or murmur. Abdomen is soft. Extremities are without edema. Gait and ROM are intact. No gross neurologic deficits noted.   LABORATORY DATA:   Assessment / Plan: 1. Coronary disease with prior extensive stenting of the LAD and right coronaries with first generation Cypher stents. Anginal symptoms are stable on medical therapy. Prior stress testing showed no evidence of ischemia. We discussed the indication for long-term Plavix with first generation coated stents and the extensive nature of his prior stenting procedures. I have recommended updating a stress myoview at this time.   2. Hypertension. Blood pressure is elevated today but generally  under good control. He is reluctant to take additional medication. Will monitor for now.    3. Hypercholesterolemia. Continue atorvastatin at current dose. Higher doses of statins resulted in myalgias. Labs checked by Dr. Reynaldo Minium.

## 2014-02-07 ENCOUNTER — Other Ambulatory Visit: Payer: Self-pay

## 2014-02-07 MED ORDER — CLOPIDOGREL BISULFATE 75 MG PO TABS: ORAL_TABLET | ORAL | Status: DC

## 2014-02-08 ENCOUNTER — Telehealth (HOSPITAL_COMMUNITY): Payer: Self-pay

## 2014-02-08 NOTE — Telephone Encounter (Signed)
Encounter complete. 

## 2014-02-15 ENCOUNTER — Ambulatory Visit (HOSPITAL_COMMUNITY)
Admission: RE | Admit: 2014-02-15 | Discharge: 2014-02-15 | Disposition: A | Discharge status: Home / Self Care | Payer: Medicare Other | Source: Ambulatory Visit | Attending: Cardiovascular Disease | Admitting: Cardiovascular Disease

## 2014-02-15 DIAGNOSIS — I2510 Atherosclerotic heart disease of native coronary artery without angina pectoris: Secondary | ICD-10-CM

## 2014-02-15 DIAGNOSIS — Z87891 Personal history of nicotine dependence: Secondary | ICD-10-CM | POA: Insufficient documentation

## 2014-02-15 DIAGNOSIS — E785 Hyperlipidemia, unspecified: Secondary | ICD-10-CM | POA: Insufficient documentation

## 2014-02-15 DIAGNOSIS — I10 Essential (primary) hypertension: Secondary | ICD-10-CM | POA: Diagnosis not present

## 2014-02-15 DIAGNOSIS — Z8249 Family history of ischemic heart disease and other diseases of the circulatory system: Secondary | ICD-10-CM | POA: Insufficient documentation

## 2014-02-15 DIAGNOSIS — R079 Chest pain, unspecified: Secondary | ICD-10-CM | POA: Insufficient documentation

## 2014-02-15 DIAGNOSIS — E7800 Pure hypercholesterolemia, unspecified: Secondary | ICD-10-CM

## 2014-02-15 MED ORDER — AMINOPHYLLINE 25 MG/ML IV SOLN
75.0000 mg | Freq: Once | INTRAVENOUS | Status: AC
  Administered 2014-02-15: 75 mg via INTRAVENOUS

## 2014-02-15 MED ORDER — REGADENOSON 0.4 MG/5ML IV SOLN
0.4000 mg | Freq: Once | INTRAVENOUS | Status: AC
  Administered 2014-02-15: 0.4 mg via INTRAVENOUS

## 2014-02-15 MED ORDER — TECHNETIUM TC 99M SESTAMIBI GENERIC - CARDIOLITE
30.0000 | Freq: Once | INTRAVENOUS | Status: AC | PRN
  Administered 2014-02-15: 30 via INTRAVENOUS

## 2014-02-15 MED ORDER — TECHNETIUM TC 99M SESTAMIBI GENERIC - CARDIOLITE
10.0000 | Freq: Once | INTRAVENOUS | Status: AC | PRN
  Administered 2014-02-15: 10 via INTRAVENOUS

## 2014-02-15 NOTE — Procedures (Addendum)
[facility] [address] [place] [address] [phone]  Cardiology Nuclear Med Study  [name] is a 68 y.o. male     MRN : [id number]     DOB: November 14, 1945  Procedure Date: 02/15/2014  Nuclear Med Background Indication for Stress Test:  Stent Patency, PTCA Patency and [facility] History:  CAD;STENT/PTCA;VAGALreactions;Seizures;Last NUC MPI on 01/15/2011-nonischemic;EF=61% Cardiac Risk Factors: Family History - CAD, History of Smoking, Hypertension and Lipids  Symptoms:  Chest Pain   Nuclear Pre-Procedure Caffeine/Decaff Intake:  7:00pm NPO After: 5:00am   IV Site: R Forearm  IV 0.9% NS with Angio Cath:  22g  Chest Size (in):  n/a IV Started by: Rolene Course, RN  Height: 6\' 1"  (1.854 m)  Cup Size: n/a  BMI:  Body mass index is 26.13 kg/(m^2). Weight:  198 lb (89.812 kg)   Tech Comments:  Test changed to Lexiscan due to patient unable to achieve target heart rate.    Nuclear Med Study 1 or 2 day study: 1 day  Stress Test Type: [place]  Order Authorizing Provider:  Peter Martinique, MD   Resting Radionuclide: Technetium 78m Sestamibi  Resting Radionuclide Dose: 10.9 mCi   Stress Radionuclide:  Technetium 54m Sestamibi  Stress Radionuclide Dose: 31.0 mCi           Stress Protocol Rest HR: 59 Stress HR: 74  Rest BP: 160/75 Stress BP: 160/75  Exercise Time (min): n/a METS: n/a          Dose of Adenosine (mg):  n/a Dose of Lexiscan: 0.4 mg  Dose of Atropine (mg): n/a Dose of Dobutamine: n/a mcg/kg/min (at max HR)  Stress Test Technologist: Mellody Memos, CCT Nuclear Technologist: Otho Perl, CNMT   Rest Procedure:  Myocardial perfusion imaging was performed at rest 45 minutes following the intravenous administration of Technetium 8m Sestamibi. Stress Procedure:  The patient received IV Lexiscan 0.4 mg over 15-seconds.  Technetium 25m Sestamibi injected IV at 30-seconds.  Patient expereinced shortness of  breath, chest tightness and was administered 75 mg of Aminophylline IV. There were no significant changes with Lexiscan.  Quantitative spect images were obtained after a 45 minute delay.  Transient Ischemic Dilatation (Normal <1.22):  1.10 QGS EDV:  125 ml QGS ESV:  54 ml LV Ejection Fraction: 57%  Rest ECG: NSR - Normal EKG  Stress ECG: No significant ST segment change suggestive of ischemia.  QPS Raw Data Images:  Normal; no motion artifact; normal heart/lung ratio. Stress Images:  Normal homogeneous uptake in all areas of the myocardium. Rest Images:  Normal homogeneous uptake in all areas of the myocardium. Subtraction (SDS):  No evidence of ischemia.  Impression Exercise Capacity:  Excellent exercise capacity. BP Response:  Hypertensive blood pressure response. Clinical Symptoms:  No significant symptoms noted. ECG Impression:  No significant ST segment change suggestive of ischemia. Comparison with Prior Nuclear Study: No significant change from previous study  Overall Impression:  Normal stress nuclear study. The patient was not able to meet target heartrate with exercise, suggesting possible chronotropic incompetence, switched to pharmacologic perfusion study. Exercise tolerance, however, was excellent.  LV Wall Motion:  NL LV Function; NL Wall Motion; EF 57%  Pixie Casino, MD, [facility] Board Certified in Nuclear Cardiology Attending Cardiologist [place] C, MD  02/15/2014 12:58 PM

## 2014-02-16 ENCOUNTER — Telehealth: Payer: Self-pay | Admitting: Cardiology

## 2014-02-16 NOTE — Telephone Encounter (Signed)
Returning your call. °

## 2014-02-16 NOTE — Telephone Encounter (Signed)
Returned call to patient myoview results given. 

## 2014-02-21 ENCOUNTER — Other Ambulatory Visit: Payer: Self-pay | Admitting: Cardiology

## 2014-03-23 ENCOUNTER — Telehealth: Payer: Self-pay | Admitting: Neurology

## 2014-03-23 DIAGNOSIS — Z5181 Encounter for therapeutic drug level monitoring: Secondary | ICD-10-CM

## 2014-03-23 MED ORDER — CARBAMAZEPINE 200 MG PO TABS: 200.0000 mg | ORAL_TABLET | Freq: Four times a day (QID) | ORAL | Status: DC

## 2014-03-23 NOTE — Telephone Encounter (Signed)
Patient would like to discuss changing Rx carbamazepine (TEGRETOL) 200 MG tablet further.  Please call and advise.

## 2014-03-23 NOTE — Telephone Encounter (Signed)
I called the patient back.  He has several questions he would like to address with provider.  Says he is willing to change from Brand Tegretol to generic Carbamazepine.  He would like to know if his total dose will remain the same; should he alternate brand, generic, brand, generic etc. Or just change from brand to generic completley.  He would like to know if there is a Civil engineer, contracting Dr Jannifer Franklin would recommend.  (He will be using [facility], and they have Scientist, physiological.)  As well, patient would like a 30 day Rx to start out with, then will transition to 90 days if he tolerates med.  He would like to know if orders can be entered for him to get lab work done after Rx is called in.  And lastly, says if he is not at home, please call cell at [phone].  Thank you.

## 2014-03-23 NOTE — Telephone Encounter (Signed)
I called patient. We will switch him to generic carbamazepine. We will check blood levels 2 weeks after he starts the new medication.

## 2014-04-12 ENCOUNTER — Other Ambulatory Visit: Payer: Self-pay | Admitting: *Deleted

## 2014-04-12 ENCOUNTER — Other Ambulatory Visit: Payer: Self-pay | Admitting: Neurology

## 2014-04-12 ENCOUNTER — Other Ambulatory Visit (INDEPENDENT_AMBULATORY_CARE_PROVIDER_SITE_OTHER): Payer: Self-pay

## 2014-04-12 ENCOUNTER — Telehealth: Payer: Self-pay | Admitting: Adult Health

## 2014-04-12 DIAGNOSIS — Z5181 Encounter for therapeutic drug level monitoring: Secondary | ICD-10-CM

## 2014-04-12 DIAGNOSIS — Z0289 Encounter for other administrative examinations: Secondary | ICD-10-CM

## 2014-04-13 LAB — CARBAMAZEPINE LEVEL, TOTAL: Carbamazepine Lvl: 9.3 ug/mL (ref 4.0–12.0)

## 2014-05-04 ENCOUNTER — Other Ambulatory Visit: Payer: Self-pay | Admitting: Dermatology

## 2014-05-26 ENCOUNTER — Telehealth: Payer: Self-pay | Admitting: Neurology

## 2014-05-26 MED ORDER — CARBAMAZEPINE 200 MG PO TABS: 200.0000 mg | ORAL_TABLET | Freq: Four times a day (QID) | ORAL | Status: DC

## 2014-05-26 NOTE — Telephone Encounter (Signed)
Rx has been sent for 90 days per patient request.  I called back.  Got no answer.  Left message.

## 2014-05-26 NOTE — Telephone Encounter (Signed)
Patient requesting 90 day supply for Rx carbamazepine (TEGRETOL) 200 MG tablet forwarded to [facility] on [address].  Patient has only a 2 day supply remaining.  Please call and advise.

## 2014-06-22 ENCOUNTER — Other Ambulatory Visit: Payer: Self-pay | Admitting: Dermatology

## 2014-08-07 ENCOUNTER — Ambulatory Visit: Payer: Medicare Other | Admitting: Cardiology

## 2014-08-23 ENCOUNTER — Encounter: Payer: Self-pay | Admitting: Cardiology

## 2014-08-23 ENCOUNTER — Ambulatory Visit (INDEPENDENT_AMBULATORY_CARE_PROVIDER_SITE_OTHER): Payer: PPO | Admitting: Cardiology

## 2014-08-23 VITALS — BP 178/76 | HR 49 | Ht 73.0 in | Wt 195.1 lb

## 2014-08-23 DIAGNOSIS — I10 Essential (primary) hypertension: Secondary | ICD-10-CM

## 2014-08-23 DIAGNOSIS — I2510 Atherosclerotic heart disease of native coronary artery without angina pectoris: Secondary | ICD-10-CM | POA: Diagnosis not present

## 2014-08-23 DIAGNOSIS — E7800 Pure hypercholesterolemia, unspecified: Secondary | ICD-10-CM

## 2014-08-23 NOTE — Progress Notes (Signed)
[name] Date of Birth: 06-25-1945 Medical Record #[id number]  History of Present Illness: [name] is seen back today for follow up CAD.  He is status post extensive stenting of the LAD with Cypher stents in the past. He had stenting of the right coronary as well. He has a diagonal branch that was jailed by the stents. Followup stress testing has been normal last in 2012 and stress Myoview in Dec. 2015 was normal.  He denies any SOB. He has rare anginal symptoms- only when he is carrying something up an incline. He exercises daily. He does report BP was elevated at last physical.    Current Outpatient Prescriptions on File Prior to Visit  Medication Sig Dispense Refill  . amLODipine (NORVASC) 5 MG tablet Take 1 tablet (5 mg total)  by mouth 2 (two) times  daily 180 tablet 3  . aspirin 81 MG tablet Take 81 mg by mouth daily.      Marland Kitchen atorvastatin (LIPITOR) 40 MG tablet TAKE 1 BY MOUTH DAILY 90 tablet 1  . carbamazepine (TEGRETOL) 200 MG tablet Take 1 tablet (200 mg total) by mouth 4 (four) times daily. 360 tablet 1  . clopidogrel (PLAVIX) 75 MG tablet Take 1 tablet by mouth  daily 90 tablet 3  . Nebivolol HCl (BYSTOLIC) 20 MG TABS Take 20 mg daily 90 tablet 3  . olmesartan (BENICAR) 40 MG tablet Take 1 tablet (40 mg total) by mouth daily. 90 tablet 3  . valACYclovir (VALTREX) 500 MG tablet Take 500 mg by mouth daily.       No current facility-administered medications on file prior to visit.    Allergies  Allergen Reactions  . Hctz [Hydrochlorothiazide]     Visual changes.    Past Medical History  Diagnosis Date  . Hypertension   . Coronary artery disease     s/p extensive stenting of the mid to distal LAD with 3 Cypher stents and past stenting of the mid RCA. He does have a diagonal branch that was jailed. He has stable angina. Negative myoview in October of 2012; Managed medically.   . Hypercholesterolemia   . Seizure disorder   . Knee pain   . Vagal reaction   . Cervical spine  disease     History of cervical spine disease    Past Surgical History  Procedure Laterality Date  . Shoulder arthroscopy      right  . Vasectomy    . Coronary artery stent placement    . Hernia repair      right groin    History  Smoking status  . Former Smoker  . Quit date: 07/03/1965  Smokeless tobacco  . Never Used    History  Alcohol Use No    Family History  Problem Relation Age of Onset  . Heart failure Father 56  . Hyperlipidemia Mother     Review of Systems: The review of systems is per the HPI.  . All other systems were reviewed and are negative.  Physical Exam: BP 178/76 mmHg  Pulse 49  Ht 6\' 1"  (1.854 m)  Wt 88.497 kg (195 lb 1.6 oz)  BMI 25.75 kg/m2 Patient is very pleasant and in no acute distress. Skin is warm and dry. Color is normal.  HEENT is unremarkable. Normocephalic/atraumatic. PERRL. Sclera are nonicteric. Neck is supple. No masses. No JVD. Lungs are clear. Cardiac exam shows a regular rate and rhythm. Normal S1-2. No gallop or murmur. Abdomen is soft. Extremities are without  edema. Gait and ROM are intact. No gross neurologic deficits noted.   LABORATORY DATA:   Assessment / Plan: 1. Coronary disease with prior extensive stenting of the LAD and right coronaries with first generation Cypher stents. Anginal symptoms are stable on medical therapy. Myoview in Dec. 2015  showed no evidence of ischemia. Continue DAPT with ASA and Plavix.   2. Hypertension. Blood pressure is high today. He is pretty much maxed out on his current medications. He is intolerant of HCTZ and Clonidine. I have asked him to monitor his BP at home and keep a diary. If his systolic is consistently higher than 163 systolic I would recommend additional therapy and would consider hydralazine.   3. Hypercholesterolemia. Continue atorvastatin at current dose. Higher doses of statins resulted in myalgias. Labs monitored by Dr. Reynaldo Minium.

## 2014-08-23 NOTE — Patient Instructions (Signed)
Keep a diary of your blood pressure   If your systolic reading is staying above 160 we should consider a change in therapy  Otherwise I will see you in 6 months.

## 2014-09-01 ENCOUNTER — Ambulatory Visit: Payer: Medicare Other | Admitting: Cardiology

## 2014-09-11 ENCOUNTER — Other Ambulatory Visit: Payer: Self-pay

## 2014-09-19 ENCOUNTER — Telehealth: Payer: Self-pay | Admitting: Cardiology

## 2014-09-19 NOTE — Telephone Encounter (Signed)
Returned call to patient he stated he wanted to know name of B/P medication Dr.Jordan mentioned to him last office visit that he said if B/P remains elevated he would start.Stated he will be seeing PCP tomorrow and he wanted him to know.Advised at [place] 08/23/14 office visit he advised if systolic B/P consistently 160 he would consider hydralazine.Stated he will be bringing a diary of B/P readings to office this week to show Dr.Jordan.

## 2014-09-19 NOTE — Telephone Encounter (Signed)
Please call,related to his blood pressure.Please be sure to call him at the number he gave the operator.

## 2014-09-21 ENCOUNTER — Telehealth: Payer: Self-pay | Admitting: Cardiology

## 2014-09-21 NOTE — Telephone Encounter (Signed)
Returned call to patient he stated he saw PCP 09/20/14 and he started him on HCTZ 25 mg daily in addition to other medications.Stated he wanted Dr.Jordan to be aware.Advised I will let Dr.Jordan know.Advised to keep appointment as planned with Dr.Jordan and call sooner if needed.

## 2014-09-21 NOTE — Telephone Encounter (Signed)
Pt called in wanting to speak with Malachy Mood about this BP . Please call back  Thanks

## 2014-09-29 ENCOUNTER — Ambulatory Visit: Payer: Medicare Other | Admitting: Adult Health

## 2014-09-29 ENCOUNTER — Ambulatory Visit (INDEPENDENT_AMBULATORY_CARE_PROVIDER_SITE_OTHER): Payer: PPO | Admitting: Neurology

## 2014-09-29 ENCOUNTER — Encounter: Payer: Self-pay | Admitting: Neurology

## 2014-09-29 VITALS — BP 148/73 | HR 49 | Ht 73.0 in | Wt 195.0 lb

## 2014-09-29 DIAGNOSIS — G40909 Epilepsy, unspecified, not intractable, without status epilepticus: Secondary | ICD-10-CM

## 2014-09-29 DIAGNOSIS — Z5181 Encounter for therapeutic drug level monitoring: Secondary | ICD-10-CM | POA: Diagnosis not present

## 2014-09-29 NOTE — Patient Instructions (Signed)

## 2014-09-29 NOTE — Progress Notes (Signed)
Reason for visit: seizures  [name] is an 69 y.o. male  History of present illness:  [name] is a 69 year old right-handed white male with a history of well-controlled epilepsy. The patient is on carbamazepine, he is transitioned off of brand-name Tegretol, and he is doing well with this. The patient has not had any further seizure events, and he feels good on the medication. The patient recently was placed on hydrochlorthiazide for uncontrolled blood pressure issues, and he is feeling somewhat fatigued on the medication. He has had a gradually progressive issue with a peripheral neuropathy with numbness in the foot that has gradually spread from the toes to the heel over the last several years. The patient has had evidence of spinal stenosis in the lower thoracic spine previously. The patient denies any back pain or pain down the legs at this time. He denies any significant changes in balance. He returns for an evaluation.  Past Medical History  Diagnosis Date  . Hypertension   . Coronary artery disease     s/p extensive stenting of the mid to distal LAD with 3 Cypher stents and past stenting of the mid RCA. He does have a diagonal branch that was jailed. He has stable angina. Negative myoview in October of 2012; Managed medically.   . Hypercholesterolemia   . Seizure disorder   . Knee pain   . Vagal reaction   . Cervical spine disease     History of cervical spine disease    Past Surgical History  Procedure Laterality Date  . Shoulder arthroscopy      right  . Vasectomy    . Coronary artery stent placement    . Hernia repair      right groin    Family History  Problem Relation Age of Onset  . Heart failure Father 57  . Hyperlipidemia Mother     Social history:  reports that he quit smoking about 49 years ago. He has never used smokeless tobacco. He reports that he drinks alcohol. He reports that he does not use illicit drugs.    Allergies  Allergen Reactions    . Hctz [Hydrochlorothiazide]     Visual changes.    Medications:  Prior to Admission medications   Medication Sig Start Date End Date Taking? Authorizing Provider  amLODipine (NORVASC) 5 MG tablet Take 1 tablet (5 mg total)  by mouth 2 (two) times  daily 02/03/14  Yes Peter M Martinique, MD  aspirin 81 MG tablet Take 81 mg by mouth daily.     Yes Historical Provider, MD  atorvastatin (LIPITOR) 40 MG tablet TAKE 1 BY MOUTH DAILY 02/21/14  Yes Peter M Martinique, MD  carbamazepine (TEGRETOL) 200 MG tablet Take 1 tablet (200 mg total) by mouth 4 (four) times daily. 05/26/14  Yes Kathrynn Ducking, MD  clopidogrel (PLAVIX) 75 MG tablet Take 1 tablet by mouth  daily 02/07/14  Yes Peter M Martinique, MD  hydrochlorothiazide (HYDRODIURIL) 25 MG tablet Take 1 tablet (25 mg total) by mouth daily. 09/21/14  Yes Peter M Martinique, MD  Nebivolol HCl (BYSTOLIC) 20 MG TABS Take 20 mg daily 02/03/14  Yes Peter M Martinique, MD  olmesartan (BENICAR) 40 MG tablet Take 1 tablet (40 mg total) by mouth daily. 02/03/14  Yes Peter M Martinique, MD  valACYclovir (VALTREX) 500 MG tablet Take 500 mg by mouth daily.     Yes Historical Provider, MD    ROS:  Out of a complete 14 system  review of symptoms, the patient complains only of the following symptoms, and all other reviewed systems are negative.  Snoring Fatigue  Blood pressure 148/73, pulse 49, height 6\' 1"  (1.854 m), weight 195 lb (88.451 kg).  Physical Exam  General: The patient is alert and cooperative at the time of the examination.  Skin: No significant peripheral edema is noted.   Neurologic Exam  Mental status: The patient is alert and oriented x 3 at the time of the examination. The patient has apparent normal recent and remote memory, with an apparently normal attention span and concentration ability.   Cranial nerves: Facial symmetry is present. Speech is normal, no aphasia or dysarthria is noted. Extraocular movements are full. Visual fields are full.  Motor:  The patient has good strength in all 4 extremities.  Sensory examination: Soft touch sensation is symmetric on the face, arms, and legs. No stocking pattern pinprick sensory deficit was noted in the lower extremities.  Coordination: The patient has good finger-nose-finger and heel-to-shin bilaterally.the patient has some apraxia with the use of the lower extremities.  Gait and station: The patient has a normal gait. Tandem gait is normal. Romberg is negative. No drift is seen.  Reflexes: Deep tendon reflexes are symmetric.   Assessment/Plan:  1. History of seizures, well controlled  2. Probable early peripheral neuropathy  The patient will undergo blood work today to check the carbamazepine level on the generic. The patient will follow-up in one year, sooner if needed. The patient will contact our office if new issues arise. We will follow the peripheral neuropathy issues over time.  Jill Alexanders MD 09/29/2014 5:44 PM  [facility] [address] [place], [place] [address]  Phone [phone] Fax [phone]

## 2014-09-30 LAB — CARBAMAZEPINE LEVEL, TOTAL: Carbamazepine Lvl: 10.2 ug/mL (ref 4.0–12.0)

## 2014-10-13 ENCOUNTER — Telehealth: Payer: Self-pay | Admitting: Neurology

## 2014-10-13 NOTE — Telephone Encounter (Signed)
Patient is calling to get lab results

## 2014-10-13 NOTE — Telephone Encounter (Signed)
I called the patient. I informed him that per Dr. Jannifer Franklin' note on his lab from 09/29/14, the patient's carbamazepine level was good and there is no need to change dosing at this time. Patient verbalized understanding.

## 2014-11-16 ENCOUNTER — Other Ambulatory Visit: Payer: Self-pay | Admitting: Neurology

## 2014-12-05 ENCOUNTER — Encounter: Payer: Self-pay | Admitting: Internal Medicine

## 2014-12-07 ENCOUNTER — Telehealth: Payer: Self-pay | Admitting: Internal Medicine

## 2014-12-07 NOTE — Telephone Encounter (Signed)
Reviewed path report with pt and explained the adenomatous polyps and the need for 5 year recall to make sure no new polyps have formed. Pt states he will call back when he has his calendar to schedule the colon.

## 2014-12-11 ENCOUNTER — Telehealth: Payer: Self-pay | Admitting: Internal Medicine

## 2014-12-11 ENCOUNTER — Telehealth: Payer: Self-pay | Admitting: Neurology

## 2014-12-11 ENCOUNTER — Telehealth: Payer: Self-pay

## 2014-12-11 NOTE — Telephone Encounter (Signed)
Sure

## 2014-12-11 NOTE — Telephone Encounter (Signed)
Pt is scheduled for colon with Dr. Henrene Pastor 02/01/15. Pt states he takes plavix. Dr. Martinique please advise if pt may hold his plavix for 5 days prior to colonoscopy.

## 2014-12-11 NOTE — Telephone Encounter (Signed)
I called the patient. He stated that last night he had a "sensation" while he was sleeping that he could not move his arms or upper body. He is not sure if this is a dream or if his Tegretol is no longer working for him. 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 He feels "wonderful" today. He requested an appointment to see Dr. Jannifer Franklin rather than discussing this over the phone. Appointment scheduled 9/27 at 9:30 AM.

## 2014-12-11 NOTE — Telephone Encounter (Signed)
I called the patient. He has what sounds like sleep paralysis. In the past, he has had episodes of what sound like a REM sleep disorder where he accepted his dreams at night. I wonder if he has a form of narcolepsy, he clearly has a sleep disorder. These episodes are not seizures. We will see him tomorrow.

## 2014-12-11 NOTE — Telephone Encounter (Signed)
Cologuard is not appropriate, or approved, for patients with a history of precancerous polyps. Thank you for asking.

## 2014-12-11 NOTE — Telephone Encounter (Signed)
Pt scheduled for previsit 01/11/15@3 :30pm, Colon scheduled in the [facility] 02/01/15@7 :30am. Note sent to Dr. Martinique regarding holding plavix for colon. Pt aware of appts.

## 2014-12-11 NOTE — Telephone Encounter (Signed)
Pt wants to schedule his colon but requests an early am appt, as early as possible. Pt states he feels very sick if he does not eat in the am. Dr. Henrene Pastor would a 7:30am appt be available on 02/01/15 for this pt? Please advise.

## 2014-12-11 NOTE — Telephone Encounter (Signed)
Patient is calling and states he had an episode last night and wonders if it could be related to his medication change Rx carbamazepine 200 mg.  He did not wish to go into detail with me.  Please call.  Thanks!

## 2014-12-11 NOTE — Telephone Encounter (Signed)
Pt has heard about new cologuard test that is less invasive. Pt states he has had some adenomatous polyps in the past and wants to know if Dr. Henrene Pastor thinks this would be a good option for him. Dr. Henrene Pastor please advise.

## 2014-12-12 ENCOUNTER — Encounter: Payer: Self-pay | Admitting: Neurology

## 2014-12-12 ENCOUNTER — Ambulatory Visit (INDEPENDENT_AMBULATORY_CARE_PROVIDER_SITE_OTHER): Payer: PPO | Admitting: Neurology

## 2014-12-12 VITALS — BP 173/71 | HR 50 | Ht 73.0 in | Wt 199.5 lb

## 2014-12-12 DIAGNOSIS — G40909 Epilepsy, unspecified, not intractable, without status epilepticus: Secondary | ICD-10-CM | POA: Diagnosis not present

## 2014-12-12 NOTE — Patient Instructions (Signed)

## 2014-12-12 NOTE — Progress Notes (Signed)
Reason for visit: Seizures  [name] is an 69 y.o. male  History of present illness:  [name] is a 69 year old right-handed white male with a history of partial complex type seizures. The patient has done relatively well, but recently had an event coming out of sleep. The patient was in a semi-waking state, but he felt paralyzed, he was unable to move the arms and the legs, he was unable to talk. He is not sure how long this lasted, it seemed to last several moments. He was quite frightened during the event. The patient indicates that his seizure-type events began in high school, he would have events where he would suddenly be unable to function well, without definite clouding of consciousness. He may have 5-10 episodes a day. The patient also have events at nighttime where he seemed to almost act out dreams. The patient was taken to the hospital with one of the events, he has no recollection of going to the hospital. He was placed on phenobarbital back then, this seemed to control these events well. The patient has not had any recurrence on seizure medications. He returns to the office today for an evaluation.  Past Medical History  Diagnosis Date  . Hypertension   . Coronary artery disease     s/p extensive stenting of the mid to distal LAD with 3 Cypher stents and past stenting of the mid RCA. He does have a diagonal branch that was jailed. He has stable angina. Negative myoview in October of 2012; Managed medically.   . Hypercholesterolemia   . Seizure disorder   . Knee pain   . Vagal reaction   . Cervical spine disease     History of cervical spine disease    Past Surgical History  Procedure Laterality Date  . Shoulder arthroscopy      right  . Vasectomy    . Coronary artery stent placement    . Hernia repair      right groin    Family History  Problem Relation Age of Onset  . Heart failure Father 50  . Hyperlipidemia Mother     Social history:  reports that he  quit smoking about 49 years ago. He has never used smokeless tobacco. He reports that he drinks alcohol. He reports that he does not use illicit drugs.    Allergies  Allergen Reactions  . Hctz [Hydrochlorothiazide]     Visual changes.    Medications:  Prior to Admission medications   Medication Sig Start Date End Date Taking? Authorizing Provider  amLODipine (NORVASC) 5 MG tablet Take 1 tablet (5 mg total)  by mouth 2 (two) times  daily 02/03/14  Yes Peter M Martinique, MD  aspirin 81 MG tablet Take 81 mg by mouth daily.     Yes Historical Provider, MD  atorvastatin (LIPITOR) 40 MG tablet TAKE 1 BY MOUTH DAILY 02/21/14  Yes Peter M Martinique, MD  carbamazepine (TEGRETOL) 200 MG tablet take 1 tablet by mouth four times a day 11/16/14  Yes Kathrynn Ducking, MD  clopidogrel (PLAVIX) 75 MG tablet Take 1 tablet by mouth  daily 02/07/14  Yes Peter M Martinique, MD  hydrochlorothiazide (HYDRODIURIL) 25 MG tablet Take 12.5 mg by mouth daily.  09/21/14  Yes Peter M Martinique, MD  Nebivolol HCl (BYSTOLIC) 20 MG TABS Take 20 mg daily 02/03/14  Yes Peter M Martinique, MD  olmesartan (BENICAR) 40 MG tablet Take 1 tablet (40 mg total) by mouth daily. 02/03/14  Yes Peter M Martinique, MD  valACYclovir (VALTREX) 500 MG tablet Take 250 mg by mouth every other day.    Yes Historical Provider, MD    ROS:  Out of a complete 14 system review of symptoms, the patient complains only of the following symptoms, and all other reviewed systems are negative.  Bruising easily Snoring  Blood pressure 173/71, pulse 50, height 6\' 1"  (1.854 m), weight 199 lb 8 oz (90.493 kg).  Physical Exam  General: The patient is alert and cooperative at the time of the examination.  Skin: No significant peripheral edema is noted.   Neurologic Exam  Mental status: The patient is alert and oriented x 3 at the time of the examination. The patient has apparent normal recent and remote memory, with an apparently normal attention span and concentration  ability.   Cranial nerves: Facial symmetry is present. Speech is normal, no aphasia or dysarthria is noted. Extraocular movements are full. Visual fields are full.  Motor: The patient has good strength in all 4 extremities.  Sensory examination: Soft touch sensation is symmetric on the face, arms, and legs.  Coordination: The patient has good finger-nose-finger and heel-to-shin bilaterally.  Gait and station: The patient has a normal gait. Tandem gait is normal. Romberg is negative. No drift is seen.  Reflexes: Deep tendon reflexes are symmetric.   Assessment/Plan:  1. History of seizures  The patient has had a recent event that likely is associated with sleep paralysis. The recent event likely is not a seizure episode. I would not restrict driving at this point. The patient has had a carbamazepine level done 2 months ago with a level of 10.2, well into the therapeutic range. We will not alter dosing at this time. The patient will follow-up for his usual appointment in July 2017. He will contact me if further events are noted.  Jill Alexanders MD 12/12/2014 8:25 PM  [facility] [address] [place], [place] [address]  Phone [phone] Fax [phone]

## 2014-12-12 NOTE — Telephone Encounter (Signed)
He may hold Plavix for 5 days for colonoscopy.  Peter Martinique MD, [facility]

## 2014-12-14 NOTE — Telephone Encounter (Signed)
Spoke to patient.Dr.Jordan advised ok to hold Plavix 5 days prior to colonoscopy.Message sent to Rosanne Sack at Dr.Perry's office.

## 2015-01-11 ENCOUNTER — Ambulatory Visit (AMBULATORY_SURGERY_CENTER): Payer: Self-pay | Admitting: *Deleted

## 2015-01-11 VITALS — Ht 73.0 in | Wt 200.0 lb

## 2015-01-11 DIAGNOSIS — Z86010 Personal history of colonic polyps: Secondary | ICD-10-CM

## 2015-01-11 MED ORDER — NA SULFATE-K SULFATE-MG SULF 17.5-3.13-1.6 GM/177ML PO SOLN: 1.0000 | Freq: Once | ORAL | Status: DC

## 2015-01-11 NOTE — Progress Notes (Signed)
No egg or soy allergy. No anesthesia problems.  No home O2.  No diet meds.  

## 2015-01-29 ENCOUNTER — Encounter: Payer: Self-pay | Admitting: *Deleted

## 2015-01-29 ENCOUNTER — Telehealth: Payer: Self-pay | Admitting: Internal Medicine

## 2015-01-29 NOTE — Telephone Encounter (Signed)
Patient had a question about taking HCTZ on day of the procedure.  Clarified with patient not to take HCTZ on day of procedure. Ileene Hutchinson, RN

## 2015-02-01 ENCOUNTER — Telehealth: Payer: Self-pay | Admitting: Internal Medicine

## 2015-02-01 ENCOUNTER — Ambulatory Visit (AMBULATORY_SURGERY_CENTER): Payer: PPO | Admitting: Internal Medicine

## 2015-02-01 ENCOUNTER — Encounter: Payer: Self-pay | Admitting: Internal Medicine

## 2015-02-01 VITALS — BP 109/57 | HR 47 | Temp 96.1°F | Resp 19 | Ht 73.0 in | Wt 200.0 lb

## 2015-02-01 DIAGNOSIS — D128 Benign neoplasm of rectum: Secondary | ICD-10-CM | POA: Diagnosis not present

## 2015-02-01 DIAGNOSIS — D122 Benign neoplasm of ascending colon: Secondary | ICD-10-CM

## 2015-02-01 DIAGNOSIS — Z86010 Personal history of colonic polyps: Secondary | ICD-10-CM | POA: Diagnosis present

## 2015-02-01 MED ORDER — SODIUM CHLORIDE 0.9 % IV SOLN: 500.0000 mL | INTRAVENOUS | Status: DC

## 2015-02-01 NOTE — Progress Notes (Signed)
Called to room to assist during endoscopic procedure.  Patient ID and intended procedure confirmed with present staff. Received instructions for my participation in the procedure from the performing physician.  

## 2015-02-01 NOTE — Patient Instructions (Signed)
YOU HAD AN ENDOSCOPIC PROCEDURE TODAY AT [facility]:   Refer to the procedure report that was given to you for any specific questions about what was found during the examination.  If the procedure report does not answer your questions, please call your gastroenterologist to clarify.  If you requested that your care partner not be given the details of your procedure findings, then the procedure report has been included in a sealed envelope for you to review at your convenience later.  YOU SHOULD EXPECT: Some feelings of bloating in the abdomen. Passage of more gas than usual.  Walking can help get rid of the air that was put into your GI tract during the procedure and reduce the bloating. If you had a lower endoscopy (such as a colonoscopy or flexible sigmoidoscopy) you may notice spotting of blood in your stool or on the toilet paper. If you underwent a bowel prep for your procedure, you may not have a normal bowel movement for a few days.  Please Note:  You might notice some irritation and congestion in your nose or some drainage.  This is from the oxygen used during your procedure.  There is no need for concern and it should clear up in a day or so.  SYMPTOMS TO REPORT IMMEDIATELY:   Following lower endoscopy (colonoscopy or flexible sigmoidoscopy):  Excessive amounts of blood in the stool  Significant tenderness or worsening of abdominal pains  Swelling of the abdomen that is new, acute  Fever of 100F or higher   For urgent or emergent issues, a gastroenterologist can be reached at any hour by calling [phone].   DIET: Your first meal following the procedure should be a small meal and then it is ok to progress to your normal diet. Heavy or fried foods are harder to digest and may make you feel nauseous or bloated.  Likewise, meals heavy in dairy and vegetables can increase bloating.  Drink plenty of fluids but you should avoid alcoholic beverages for 24  hours.  ACTIVITY:  You should plan to take it easy for the rest of today and you should NOT DRIVE or use heavy machinery until tomorrow (because of the sedation medicines used during the test).    FOLLOW UP: Our staff will call the number listed on your records the next business day following your procedure to check on you and address any questions or concerns that you may have regarding the information given to you following your procedure. If we do not reach you, we will leave a message.  However, if you are feeling well and you are not experiencing any problems, there is no need to return our call.  We will assume that you have returned to your regular daily activities without incident.  If any biopsies were taken you will be contacted by phone or by letter within the next 1-3 weeks.  Please call us at [phone] if you have not heard about the biopsies in 3 weeks.    SIGNATURES/CONFIDENTIALITY: You and/or your care partner have signed paperwork which will be entered into your electronic medical record.  These signatures attest to the fact that that the information above on your After Visit Summary has been reviewed and is understood.  Full responsibility of the confidentiality of this discharge information lies with you and/or your care-partner.  Polyp information given.  Next procedure 5 years-2021.  Resume plavix today.

## 2015-02-01 NOTE — Op Note (Signed)
[facility]  [address] [place], [address]   COLONOSCOPY PROCEDURE REPORT  PATIENT: Coltrane, Tirabassi  MR#: [id number] BIRTHDATE: [birth year] , 44  yrs. old GENDER: male ENDOSCOPIST: Eustace Quail, MD REFERRED [id number] Program Recall PROCEDURE DATE:  02/01/2015 PROCEDURE:   Colonoscopy, surveillance and Colonoscopy with snare polypectomy x 2 First Screening Colonoscopy - Avg.  risk and is 50 yrs.  old or older - No.  Prior Negative Screening - Now for repeat screening. N/A  History of Adenoma - Now for follow-up colonoscopy & has been > or = to 3 yrs.  Yes hx of adenoma.  Has been 3 or more years since last colonoscopy.  Polyps removed today? Yes ASA CLASS:   Class III INDICATIONS:Surveillance due to prior colonic neoplasia and PH Colon Adenoma. . Previous examinations 2003, 2006, and 2011 each with tubular adenomas MEDICATIONS: Monitored anesthesia care and Propofol 250 mg IV  DESCRIPTION OF PROCEDURE:   After the risks benefits and alternatives of the procedure were thoroughly explained, informed consent was obtained.  The digital rectal exam revealed no abnormalities of the rectum.   The LB [id number]  endoscope was introduced through the anus and advanced to the cecum, which was identified by both the appendix and ileocecal valve. No adverse events experienced.   The quality of the prep was excellent. (Suprep was used)  The instrument was then slowly withdrawn as the colon was fully examined. Estimated blood loss is zero unless otherwise noted in this procedure report.  COLON FINDINGS: Two polyps ranging between 3-24mm in size were found in the rectum and ascending colon.  A polypectomy was performed with a cold snare.  The resection was complete, the polyp tissue was completely retrieved and sent to histology.   The examination was otherwise normal.  Retroflexed views revealed internal hemorrhoids. The time to cecum = 3.4 Withdrawal time =  10.5   The scope was withdrawn and the procedure completed. COMPLICATIONS: There were no immediate complications.  ENDOSCOPIC IMPRESSION: 1.   Two polyps were found in the rectum and ascending colon; polypectomy was performed with a cold snare 2.   The examination was otherwise normal  RECOMMENDATIONS: 1.  Follow up colonoscopy in 5 years 2.  Resume Plavix today  eSigned:  Eustace Quail, MD 02/01/2015 8:10 AM   cc: The Patient and Burnard Bunting, MD

## 2015-02-01 NOTE — Progress Notes (Signed)
A/ox3 pleased with MAC, report to Jane RN 

## 2015-02-01 NOTE — Telephone Encounter (Signed)
LMOM that is it ok to resume medications, including Plavix, today

## 2015-02-02 ENCOUNTER — Telehealth: Payer: Self-pay | Admitting: Internal Medicine

## 2015-02-02 NOTE — Telephone Encounter (Signed)
°  Follow up Call-  Call back number 02/01/2015  Post procedure Call Back phone  # [phone]  Permission to leave phone message Yes     Patient questions:  Do you have a fever, pain , or abdominal swelling? No. Pain Score  0 *  Have you tolerated food without any problems? Yes.    Have you been able to return to your normal activities? Yes.    Do you have any questions about your discharge instructions: Diet   No. Medications  No. Follow up visit  No.  Do you have questions or concerns about your Care? No.  Actions: * If pain score is 4 or above: No action needed, pain <4.

## 2015-02-06 ENCOUNTER — Encounter: Payer: Self-pay | Admitting: Internal Medicine

## 2015-02-23 ENCOUNTER — Encounter: Payer: Self-pay | Admitting: *Deleted

## 2015-02-23 NOTE — Telephone Encounter (Signed)
Erroneous encounter

## 2015-02-28 ENCOUNTER — Ambulatory Visit (INDEPENDENT_AMBULATORY_CARE_PROVIDER_SITE_OTHER): Payer: PPO | Admitting: Cardiology

## 2015-02-28 ENCOUNTER — Encounter: Payer: Self-pay | Admitting: Cardiology

## 2015-02-28 VITALS — BP 128/64 | HR 50 | Ht 73.0 in | Wt 202.1 lb

## 2015-02-28 DIAGNOSIS — I10 Essential (primary) hypertension: Secondary | ICD-10-CM | POA: Diagnosis not present

## 2015-02-28 DIAGNOSIS — I2510 Atherosclerotic heart disease of native coronary artery without angina pectoris: Secondary | ICD-10-CM | POA: Diagnosis not present

## 2015-02-28 DIAGNOSIS — E7800 Pure hypercholesterolemia, unspecified: Secondary | ICD-10-CM | POA: Diagnosis not present

## 2015-02-28 MED ORDER — NEBIVOLOL HCL 10 MG PO TABS: 10.0000 mg | ORAL_TABLET | Freq: Every day | ORAL | Status: DC

## 2015-02-28 NOTE — Patient Instructions (Signed)
Reduce Bystolic to 10 mg daily  Follow your blood pressure and let us know if it increases.   Continue your other medication and stay active.

## 2015-03-01 NOTE — Progress Notes (Signed)
[name] Date of Birth: [birth year] Medical Record [id number]  History of Present Illness: [name] is seen back today for follow up CAD.  He is status post extensive stenting of the LAD with Cypher stents in the past. He had stenting of the right coronary as well. He has a diagonal branch that was jailed by the stents. Followup stress testing has been normal last in 2012 and stress Myoview in Dec. 2015 was normal.  On follow up today he is doing well.  He denies any SOB. No chest pain. He is very active working out 3 times a week, doing heavy yard work and Marketing executive. The only thing he notes is that he is more tired now. On his last stress test he was noted to have a peak HR of only 74 consistent with chronotropic incompetence.    Current Outpatient Prescriptions on File Prior to Visit  Medication Sig Dispense Refill  . amLODipine (NORVASC) 5 MG tablet Take 1 tablet (5 mg total)  by mouth 2 (two) times  daily 180 tablet 3  . aspirin 81 MG tablet Take 81 mg by mouth daily.      Marland Kitchen atorvastatin (LIPITOR) 40 MG tablet TAKE 1 BY MOUTH DAILY 90 tablet 1  . carbamazepine (TEGRETOL) 200 MG tablet take 1 tablet by mouth four times a day 360 tablet 3  . clopidogrel (PLAVIX) 75 MG tablet Take 1 tablet by mouth  daily 90 tablet 3  . hydrochlorothiazide (HYDRODIURIL) 25 MG tablet Take 12.5 mg by mouth daily.  30 tablet 6  . olmesartan (BENICAR) 40 MG tablet Take 1 tablet (40 mg total) by mouth daily. 90 tablet 3  . valACYclovir (VALTREX) 500 MG tablet Take 250 mg by mouth every other day.      No current facility-administered medications on file prior to visit.    No Known Allergies  Past Medical History  Diagnosis Date  . Hypertension   . Coronary artery disease     s/p extensive stenting of the mid to distal LAD with 3 Cypher stents and past stenting of the mid RCA. He does have a diagonal branch that was jailed. He has stable angina. Negative myoview in October of 2012; Managed medically.    . Hypercholesterolemia   . Seizure disorder ([place])   . Knee pain   . Vagal reaction   . Cervical spine disease     History of cervical spine disease  . Sleep apnea     use to wear cpap    Past Surgical History  Procedure Laterality Date  . Shoulder arthroscopy      right  . Vasectomy    . Coronary artery stent placement    . Hernia repair      right groin  . Colonoscopy      History  Smoking status  . Former Smoker  . Quit date: 07/03/1965  Smokeless tobacco  . Never Used    History  Alcohol Use  . 0.0 oz/week  . 0 Standard drinks or equivalent per week    Comment: 2 beers/month    Family History  Problem Relation Age of Onset  . Heart failure Father 89  . Hyperlipidemia Mother   . Colon cancer Neg Hx     Review of Systems: The review of systems is per the HPI.  . All other systems were reviewed and are negative.  Physical Exam: BP 128/64 mmHg  Pulse 50  Ht 6\' 1"  (1.854 m)  Wt 91.672  kg (202 lb 1.6 oz)  BMI 26.67 kg/m2 Patient is very pleasant and in no acute distress. Skin is warm and dry. Color is normal.  HEENT is unremarkable. Normocephalic/atraumatic. PERRL. Sclera are nonicteric. Neck is supple. No masses. No JVD. Lungs are clear. Cardiac exam shows a regular rate and rhythm. Normal S1-2. No gallop or murmur. Abdomen is soft. Extremities are without edema. Gait and ROM are intact. No gross neurologic deficits noted.   LABORATORY DATA: Ecg today shows Sinus brady with rate 50. Old septal infarct. LAFB. I have personally reviewed and interpreted this study.   Assessment / Plan: 1. Coronary disease with prior extensive stenting of the LAD and right coronaries with first generation Cypher stents. Anginal symptoms are stable on medical therapy. Myoview in Dec. 2015  showed no evidence of ischemia. Continue DAPT with ASA and Plavix long term.   2. Hypertension. Blood pressure is well controlled today.   3. Hypercholesterolemia. Continue atorvastatin at  current dose. Higher doses of statins resulted in myalgias. Labs monitored by Dr. Reynaldo Minium.  4. Chronotropic incompetence. May be contributing to his tiredness. Will reduce Bystolic to 10 mg daily. Will need to watch BP and HR response.

## 2015-03-05 ENCOUNTER — Telehealth: Payer: Self-pay | Admitting: Cardiology

## 2015-03-05 NOTE — Telephone Encounter (Signed)
Please call,regarding medication change that took place last week.

## 2015-03-05 NOTE — Telephone Encounter (Signed)
Returned call to patient.He stated he decreased Bystolic to 10 mg daily on 03/02/15.He wanted to know when he will see a difference in B/P.Advised to wait about 7 days.Advised to call back if B/P elevated.

## 2015-03-19 ENCOUNTER — Emergency Department (HOSPITAL_COMMUNITY)
Admission: EM | Admit: 2015-03-19 | Discharge: 2015-03-19 | Disposition: A | Discharge status: Home / Self Care | Payer: PPO | Attending: Emergency Medicine | Admitting: Emergency Medicine

## 2015-03-19 ENCOUNTER — Encounter (HOSPITAL_COMMUNITY): Payer: Self-pay | Admitting: Physical Medicine and Rehabilitation

## 2015-03-19 ENCOUNTER — Emergency Department (HOSPITAL_COMMUNITY): Payer: PPO

## 2015-03-19 DIAGNOSIS — E7800 Pure hypercholesterolemia, unspecified: Secondary | ICD-10-CM | POA: Insufficient documentation

## 2015-03-19 DIAGNOSIS — R55 Syncope and collapse: Secondary | ICD-10-CM | POA: Diagnosis not present

## 2015-03-19 DIAGNOSIS — S91011A Laceration without foreign body, right ankle, initial encounter: Secondary | ICD-10-CM | POA: Diagnosis not present

## 2015-03-19 DIAGNOSIS — Z7982 Long term (current) use of aspirin: Secondary | ICD-10-CM | POA: Insufficient documentation

## 2015-03-19 DIAGNOSIS — Y998 Other external cause status: Secondary | ICD-10-CM | POA: Diagnosis not present

## 2015-03-19 DIAGNOSIS — W01198A Fall on same level from slipping, tripping and stumbling with subsequent striking against other object, initial encounter: Secondary | ICD-10-CM | POA: Diagnosis not present

## 2015-03-19 DIAGNOSIS — Z9981 Dependence on supplemental oxygen: Secondary | ICD-10-CM | POA: Insufficient documentation

## 2015-03-19 DIAGNOSIS — S199XXA Unspecified injury of neck, initial encounter: Secondary | ICD-10-CM | POA: Diagnosis not present

## 2015-03-19 DIAGNOSIS — I10 Essential (primary) hypertension: Secondary | ICD-10-CM | POA: Diagnosis not present

## 2015-03-19 DIAGNOSIS — I2510 Atherosclerotic heart disease of native coronary artery without angina pectoris: Secondary | ICD-10-CM | POA: Insufficient documentation

## 2015-03-19 DIAGNOSIS — S0101XA Laceration without foreign body of scalp, initial encounter: Secondary | ICD-10-CM | POA: Diagnosis not present

## 2015-03-19 DIAGNOSIS — Z79899 Other long term (current) drug therapy: Secondary | ICD-10-CM | POA: Insufficient documentation

## 2015-03-19 DIAGNOSIS — Y9389 Activity, other specified: Secondary | ICD-10-CM | POA: Insufficient documentation

## 2015-03-19 DIAGNOSIS — Z87891 Personal history of nicotine dependence: Secondary | ICD-10-CM | POA: Diagnosis not present

## 2015-03-19 DIAGNOSIS — G4730 Sleep apnea, unspecified: Secondary | ICD-10-CM | POA: Diagnosis not present

## 2015-03-19 DIAGNOSIS — S0990XA Unspecified injury of head, initial encounter: Secondary | ICD-10-CM | POA: Diagnosis not present

## 2015-03-19 DIAGNOSIS — Y92031 Bathroom in apartment as the place of occurrence of the external cause: Secondary | ICD-10-CM | POA: Insufficient documentation

## 2015-03-19 LAB — CBC WITH DIFFERENTIAL/PLATELET
Basophils Absolute: 0 10*3/uL (ref 0.0–0.1)
Basophils Relative: 0 %
EOS PCT: 1 %
Eosinophils Absolute: 0.1 10*3/uL (ref 0.0–0.7)
HEMATOCRIT: 38.6 % — AB (ref 39.0–52.0)
Hemoglobin: 13 g/dL (ref 13.0–17.0)
LYMPHS ABS: 0.8 10*3/uL (ref 0.7–4.0)
LYMPHS PCT: 8 %
MCH: 29.2 pg (ref 26.0–34.0)
MCHC: 33.7 g/dL (ref 30.0–36.0)
MCV: 86.7 fL (ref 78.0–100.0)
MONO ABS: 0.8 10*3/uL (ref 0.1–1.0)
Monocytes Relative: 9 %
NEUTROS ABS: 7.8 10*3/uL — AB (ref 1.7–7.7)
Neutrophils Relative %: 82 %
PLATELETS: 191 10*3/uL (ref 150–400)
RBC: 4.45 MIL/uL (ref 4.22–5.81)
RDW: 12.8 % (ref 11.5–15.5)
WBC: 9.4 10*3/uL (ref 4.0–10.5)

## 2015-03-19 LAB — I-STAT CHEM 8, ED
BUN: 21 mg/dL — ABNORMAL HIGH (ref 6–20)
CREATININE: 1.1 mg/dL (ref 0.61–1.24)
Calcium, Ion: 1.12 mmol/L — ABNORMAL LOW (ref 1.13–1.30)
Chloride: 96 mmol/L — ABNORMAL LOW (ref 101–111)
GLUCOSE: 109 mg/dL — AB (ref 65–99)
HCT: 42 % (ref 39.0–52.0)
HEMOGLOBIN: 14.3 g/dL (ref 13.0–17.0)
Potassium: 4.2 mmol/L (ref 3.5–5.1)
Sodium: 133 mmol/L — ABNORMAL LOW (ref 135–145)
TCO2: 25 mmol/L (ref 0–100)

## 2015-03-19 LAB — COMPREHENSIVE METABOLIC PANEL
ALT: 20 U/L (ref 17–63)
AST: 21 U/L (ref 15–41)
Albumin: 3.6 g/dL (ref 3.5–5.0)
Alkaline Phosphatase: 66 U/L (ref 38–126)
Anion gap: 9 (ref 5–15)
BILIRUBIN TOTAL: 0.5 mg/dL (ref 0.3–1.2)
BUN: 18 mg/dL (ref 6–20)
CALCIUM: 8.8 mg/dL — AB (ref 8.9–10.3)
CHLORIDE: 100 mmol/L — AB (ref 101–111)
CO2: 25 mmol/L (ref 22–32)
CREATININE: 1.15 mg/dL (ref 0.61–1.24)
Glucose, Bld: 116 mg/dL — ABNORMAL HIGH (ref 65–99)
Potassium: 4.4 mmol/L (ref 3.5–5.1)
Sodium: 134 mmol/L — ABNORMAL LOW (ref 135–145)
TOTAL PROTEIN: 6.2 g/dL — AB (ref 6.5–8.1)

## 2015-03-19 LAB — I-STAT TROPONIN, ED: TROPONIN I, POC: 0 ng/mL (ref 0.00–0.08)

## 2015-03-19 MED ORDER — LIDOCAINE-EPINEPHRINE 1 %-1:100000 IJ SOLN
20.0000 mL | Freq: Once | INTRAMUSCULAR | Status: AC
  Administered 2015-03-19: 20 mL via INTRADERMAL
  Filled 2015-03-19: qty 1

## 2015-03-19 MED ORDER — SODIUM CHLORIDE 0.9 % IV BOLUS (SEPSIS)
1000.0000 mL | Freq: Once | INTRAVENOUS | Status: AC
  Administered 2015-03-19: 1000 mL via INTRAVENOUS

## 2015-03-19 NOTE — ED Notes (Signed)
Dressing applied to foot laceration and head laceration.

## 2015-03-19 NOTE — ED Notes (Signed)
Pt verbalizes understanding of instructions  Home with steady gait in company of wife.

## 2015-03-19 NOTE — ED Notes (Addendum)
Pt continues to bleed through bandage. Dr. Tyrone Nine at bedside for suturing. Large hematoma noted to R posterior head

## 2015-03-19 NOTE — Discharge Instructions (Signed)
See your doctor in a week for staple removal and then in 2 weeks for Syncope Syncope is a medical term for fainting or passing out. This means you lose consciousness and drop to the ground. People are generally unconscious for less than 5 minutes. You may have some muscle twitches for up to 15 seconds before waking up and returning to normal. Syncope occurs more often in older adults, but it can happen to anyone. While most causes of syncope are not dangerous, syncope can be a sign of a serious medical problem. It is important to seek medical care.  CAUSES  Syncope is caused by a sudden drop in blood flow to the brain. The specific cause is often not determined. Factors that can bring on syncope include:  Taking medicines that lower blood pressure.  Sudden changes in posture, such as standing up quickly.  Taking more medicine than prescribed.  Standing in one place for too long.  Seizure disorders.  Dehydration and excessive exposure to heat.  Low blood sugar (hypoglycemia).  Straining to have a bowel movement.  Heart disease, irregular heartbeat, or other circulatory problems.  Fear, emotional distress, seeing blood, or severe pain. SYMPTOMS  Right before fainting, you may:  Feel dizzy or light-headed.  Feel nauseous.  See all white or all black in your field of vision.  Have cold, clammy skin. DIAGNOSIS  Your health care provider will ask about your symptoms, perform a physical exam, and perform an electrocardiogram (ECG) to record the electrical activity of your heart. Your health care provider may also perform other heart or blood tests to determine the cause of your syncope which may include:  Transthoracic echocardiogram (TTE). During echocardiography, sound waves are used to evaluate how blood flows through your heart.  Transesophageal echocardiogram (TEE).  Cardiac monitoring. This allows your health care provider to monitor your heart rate and rhythm in real  time.  Holter monitor. This is a portable device that records your heartbeat and can help diagnose heart arrhythmias. It allows your health care provider to track your heart activity for several days, if needed.  Stress tests by exercise or by giving medicine that makes the heart beat faster. TREATMENT  In most cases, no treatment is needed. Depending on the cause of your syncope, your health care provider may recommend changing or stopping some of your medicines. HOME CARE INSTRUCTIONS  Have someone stay with you until you feel stable.  Do not drive, use machinery, or play sports until your health care provider says it is okay.  Keep all follow-up appointments as directed by your health care provider.  Lie down right away if you start feeling like you might faint. Breathe deeply and steadily. Wait until all the symptoms have passed.  Drink enough fluids to keep your urine clear or pale yellow.  If you are taking blood pressure or heart medicine, get up slowly and take several minutes to sit and then stand. This can reduce dizziness. SEEK IMMEDIATE MEDICAL CARE IF:   You have a severe headache.  You have unusual pain in the chest, abdomen, or back.  You are bleeding from your mouth or rectum, or you have black or tarry stool.  You have an irregular or very fast heartbeat.  You have pain with breathing.  You have repeated fainting or seizure-like jerking during an episode.  You faint when sitting or lying down.  You have confusion.  You have trouble walking.  You have severe weakness.  You have vision  problems. If you fainted, call your local emergency services (911 in [place] Do not drive yourself to the hospital.    This information is not intended to replace advice given to you by your health care provider. Make sure you discuss any questions you have with your health care provider.   Document Released: 03/03/2005 Document Revised: 07/18/2014 Document Reviewed:  05/02/2011 Elsevier Interactive Patient Education [employer].

## 2015-03-19 NOTE — ED Notes (Signed)
Pt reports he fell coming out of bathroom this morning. States he woke up laying on floor in pool of blood. Laceration noted to posterior scalp, bleeding at the time. Pt currently taking plavix. He is alert, and oriented x4. No neurological deficits noted. C/o headache and neck discomfort.

## 2015-03-19 NOTE — ED Provider Notes (Signed)
CSN: [id number]     Arrival date & time 03/19/15  0415 History   First MD Initiated Contact with Patient 03/19/15 [phone]     Chief Complaint  Patient presents with  . Fall  . Head Laceration     (Consider location/radiation/quality/duration/timing/severity/associated sxs/prior Treatment) Patient is a 70 y.o. male presenting with fall, scalp laceration, and syncope. The history is provided by the patient.  Fall Pertinent negatives include no chest pain, no abdominal pain, no headaches and no shortness of breath.  Head Laceration Pertinent negatives include no chest pain, no abdominal pain, no headaches and no shortness of breath.  Loss of Consciousness Episode history:  Single Most recent episode:  Today Duration: unsure. Timing:  Rare Progression:  Resolved Chronicity:  New Context: standing up   Witnessed: no   Relieved by:  Lying down Worsened by:  Nothing tried Ineffective treatments:  None tried Associated symptoms: no chest pain, no confusion, no diaphoresis, no fever, no headaches, no palpitations, no shortness of breath and no vomiting   Associated symptoms comment:  Head cold  Risk factors: coronary artery disease      70 yo M with a fall.  Patient stood up to use the bathroom, felt like he was going to pass out.  Sat back down felt a little better then got up and passed out on the way to the bathroom.  Denies CP, sob.  Diaphoretic when he got up.  Denies vomiting, diarrhea.  Patient with decreased oral intake for past couple days due to "head cold".  Denies headache prior to fall.  Mild congestion feeling. Feels better now.  Had bleeding to back of head and to right ankle.    Past Medical History  Diagnosis Date  . Hypertension   . Coronary artery disease     s/p extensive stenting of the mid to distal LAD with 3 Cypher stents and past stenting of the mid RCA. He does have a diagonal branch that was jailed. He has stable angina. Negative myoview in October of 2012;  Managed medically.   . Hypercholesterolemia   . Seizure disorder ([place])   . Knee pain   . Vagal reaction   . Cervical spine disease     History of cervical spine disease  . Sleep apnea     use to wear cpap   Past Surgical History  Procedure Laterality Date  . Shoulder arthroscopy      right  . Vasectomy    . Coronary artery stent placement    . Hernia repair      right groin  . Colonoscopy     Family History  Problem Relation Age of Onset  . Heart failure Father 91  . Hyperlipidemia Mother   . Colon cancer Neg Hx    Social History  Substance Use Topics  . Smoking status: Former Smoker    Quit date: 07/03/1965  . Smokeless tobacco: Never Used  . Alcohol Use: 0.0 oz/week    0 Standard drinks or equivalent per week     Comment: 2 beers/month    Review of Systems  Constitutional: Negative for fever, chills and diaphoresis.  HENT: Positive for congestion, postnasal drip and sinus pressure. Negative for facial swelling.   Eyes: Negative for discharge and visual disturbance.  Respiratory: Negative for shortness of breath.   Cardiovascular: Positive for syncope. Negative for chest pain and palpitations.  Gastrointestinal: Negative for vomiting, abdominal pain and diarrhea.  Musculoskeletal: Negative for myalgias and arthralgias.  Skin: Negative  for color change and rash.  Neurological: Negative for tremors, syncope and headaches.  Psychiatric/Behavioral: Negative for confusion and dysphoric mood.      Allergies  Review of patient's allergies indicates no known allergies.  Home Medications   Prior to Admission medications   Medication Sig Start Date End Date Taking? Authorizing Provider  amLODipine (NORVASC) 5 MG tablet Take 1 tablet (5 mg total)  by mouth 2 (two) times  daily 02/03/14  Yes Peter M Martinique, MD  aspirin 81 MG tablet Take 81 mg by mouth daily.     Yes Historical Provider, MD  atorvastatin (LIPITOR) 40 MG tablet TAKE 1 BY MOUTH DAILY 02/21/14  Yes Peter M  Martinique, MD  carbamazepine (TEGRETOL) 200 MG tablet take 1 tablet by mouth four times a day 11/16/14  Yes Kathrynn Ducking, MD  clopidogrel (PLAVIX) 75 MG tablet Take 1 tablet by mouth  daily 02/07/14  Yes Peter M Martinique, MD  hydrochlorothiazide (HYDRODIURIL) 25 MG tablet Take 12.5 mg by mouth daily.  09/21/14  Yes Peter M Martinique, MD  hydrochlorothiazide (MICROZIDE) 12.5 MG capsule Take 12.5 mg by mouth daily.   Yes Historical Provider, MD  nebivolol (BYSTOLIC) 10 MG tablet Take 1 tablet (10 mg total) by mouth daily. 02/28/15  Yes Peter M Martinique, MD  olmesartan (BENICAR) 40 MG tablet Take 1 tablet (40 mg total) by mouth daily. 02/03/14  Yes Peter M Martinique, MD  valACYclovir (VALTREX) 500 MG tablet Take 250 mg by mouth every other day.    Yes Historical Provider, MD   BP 124/61 mmHg  Pulse 53  Temp(Src) 99 F (37.2 C) (Oral)  Resp 16  Ht 6\' 1"  (1.854 m)  Wt 195 lb (88.451 kg)  BMI 25.73 kg/m2  SpO2 97% Physical Exam  Constitutional: He is oriented to person, place, and time. He appears well-developed and well-nourished.  HENT:  Head: Normocephalic and atraumatic.    Swollen turbinates, no noted sinus TTP, TM normal bilaterally  Eyes: EOM are normal. Pupils are equal, round, and reactive to light.  Neck: Normal range of motion. Neck supple. No JVD present.  Cardiovascular: Normal rate, regular rhythm and intact distal pulses.  Exam reveals no gallop and no friction rub.   No murmur heard. Pulmonary/Chest: No respiratory distress. He has no wheezes.  Abdominal: He exhibits no distension. There is no rebound and no guarding.  Musculoskeletal: Normal range of motion.       Feet:  Neurological: He is alert and oriented to person, place, and time.  Skin: No rash noted. No pallor.  Psychiatric: He has a normal mood and affect. His behavior is normal.  Nursing note and vitals reviewed.   ED Course  .Marland KitchenLaceration Repair Date/Time: 03/19/2015 6:03 PM Performed by: Tyrone Nine Terrance Usery Authorized by:  Deno Etienne Consent: Verbal consent obtained. Risks and benefits: risks, benefits and alternatives were discussed Consent given by: patient Required items: required blood products, implants, devices, and special equipment available Patient identity confirmed: verbally with patient Body area: head/neck Location details: scalp Laceration length: 7 cm Tendon involvement: none Nerve involvement: none Vascular damage: no Anesthesia: local infiltration Local anesthetic: lidocaine 1% with epinephrine Anesthetic total: 8 ml Preparation: Patient was prepped and draped in the usual sterile fashion. Irrigation solution: saline Irrigation method: jet lavage Amount of cleaning: extensive Debridement: none Degree of undermining: none Skin closure: staples Number of sutures: 7 Technique: simple Approximation difficulty: simple Patient tolerance: Patient tolerated the procedure well with no immediate complications Comments: Done emergently as  patient having active venous bleeding not controlled with direct pressure.  Explored without pulsatile bleeding.  Repair with good hemostasis.   Marland Kitchen.Laceration Repair Date/Time: 03/19/2015 6:05 PM Performed by: Tyrone Nine Isamar Wellbrock Authorized by: Deno Etienne Consent: Verbal consent obtained. Risks and benefits: risks, benefits and alternatives were discussed Consent given by: patient Required items: required blood products, implants, devices, and special equipment available Body area: lower extremity Location details: right ankle Laceration length: 7 cm Tendon involvement: none Nerve involvement: none Vascular damage: no Anesthesia: local infiltration Local anesthetic: lidocaine 1% with epinephrine Anesthetic total: 4 ml Preparation: Patient was prepped and draped in the usual sterile fashion. Irrigation solution: saline Irrigation method: syringe Amount of cleaning: extensive Debridement: none Degree of undermining: none Skin closure: 4-0 nylon Number of  sutures: 8 Technique: simple Approximation: close Approximation difficulty: simple Dressing: 4x4 sterile gauze Patient tolerance: Patient tolerated the procedure well with no immediate complications   (including critical care time) Labs Review Labs Reviewed  CBC WITH DIFFERENTIAL/PLATELET - Abnormal; Notable for the following:    HCT 38.6 (*)    Neutro Abs 7.8 (*)    All other components within normal limits  COMPREHENSIVE METABOLIC PANEL - Abnormal; Notable for the following:    Sodium 134 (*)    Chloride 100 (*)    Glucose, Bld 116 (*)    Calcium 8.8 (*)    Total Protein 6.2 (*)    All other components within normal limits  I-STAT CHEM 8, ED - Abnormal; Notable for the following:    Sodium 133 (*)    Chloride 96 (*)    BUN 21 (*)    Glucose, Bld 109 (*)    Calcium, Ion 1.12 (*)    All other components within normal limits  I-STAT TROPOININ, ED    Imaging Review Ct Head Wo Contrast  03/19/2015  CLINICAL DATA:  Initial evaluation for acute trauma, fall. EXAM: CT HEAD WITHOUT CONTRAST CT CERVICAL SPINE WITHOUT CONTRAST TECHNIQUE: Multidetector CT imaging of the head and cervical spine was performed following the standard protocol without intravenous contrast. Multiplanar CT image reconstructions of the cervical spine were also generated. COMPARISON:  Prior study from 06/19/2009. FINDINGS: CT HEAD FINDINGS Right parietal scalp contusion/ laceration present. Scalp soft tissues otherwise within normal limits. No acute abnormality about the partially visualized orbits. Minimal opacity within the right sphenoid sinus. Scattered mucosal thickening within the partially visualized ethmoidal air cells. Visualized paranasal sinuses are otherwise clear. No mastoid effusion. Calvarium intact. No extra-axial fluid collection. Age-appropriate cerebral volume loss present. No significant white matter disease for patient age. Out atheromatous plaque within the carotid siphons. No acute intracranial  hemorrhage or large vessel territory infarct. No hydrocephalus. No definite mass lesion. Subtle round hyperdensity overlying the anterior inferior left frontal lobe on axial image 7 favored to reflect volume averaging with the adjacent calvarium. CT CERVICAL SPINE FINDINGS Straightening of the normal cervical lordosis, which may be related to patient positioning or muscular spasm. Vertebral body heights are preserved. Normal C1-2 articulations are intact. No prevertebral soft tissue swelling. No acute fracture or listhesis. Moderate degenerative spondylolysis present at C4-5, C5-6, C6-7, and C7-T1. Prominent right-sided facet arthrosis at C7-T1. Visualized soft tissues of the neck demonstrate no acute abnormality. Vascular calcifications noted about the carotid siphons. Visualized lung apices are clear without evidence of apical pneumothorax. IMPRESSION: CT BRAIN: 1. No acute intracranial process. 2. Right parietal scalp contusion/laceration. CT CERVICAL SPINE: 1. No acute traumatic injury within the cervical spine. 2. Straightening of the  normal cervical lordosis, which may related to positioning or muscular spasm. 3. Moderate degenerative spondylolysis at C4-5 through C7-T1. Electronically Signed   By: Jeannine Boga M.D.   On: 03/19/2015 06:16   Ct Cervical Spine Wo Contrast  03/19/2015  CLINICAL DATA:  Initial evaluation for acute trauma, fall. EXAM: CT HEAD WITHOUT CONTRAST CT CERVICAL SPINE WITHOUT CONTRAST TECHNIQUE: Multidetector CT imaging of the head and cervical spine was performed following the standard protocol without intravenous contrast. Multiplanar CT image reconstructions of the cervical spine were also generated. COMPARISON:  Prior study from 06/19/2009. FINDINGS: CT HEAD FINDINGS Right parietal scalp contusion/ laceration present. Scalp soft tissues otherwise within normal limits. No acute abnormality about the partially visualized orbits. Minimal opacity within the right sphenoid sinus.  Scattered mucosal thickening within the partially visualized ethmoidal air cells. Visualized paranasal sinuses are otherwise clear. No mastoid effusion. Calvarium intact. No extra-axial fluid collection. Age-appropriate cerebral volume loss present. No significant white matter disease for patient age. Out atheromatous plaque within the carotid siphons. No acute intracranial hemorrhage or large vessel territory infarct. No hydrocephalus. No definite mass lesion. Subtle round hyperdensity overlying the anterior inferior left frontal lobe on axial image 7 favored to reflect volume averaging with the adjacent calvarium. CT CERVICAL SPINE FINDINGS Straightening of the normal cervical lordosis, which may be related to patient positioning or muscular spasm. Vertebral body heights are preserved. Normal C1-2 articulations are intact. No prevertebral soft tissue swelling. No acute fracture or listhesis. Moderate degenerative spondylolysis present at C4-5, C5-6, C6-7, and C7-T1. Prominent right-sided facet arthrosis at C7-T1. Visualized soft tissues of the neck demonstrate no acute abnormality. Vascular calcifications noted about the carotid siphons. Visualized lung apices are clear without evidence of apical pneumothorax. IMPRESSION: CT BRAIN: 1. No acute intracranial process. 2. Right parietal scalp contusion/laceration. CT CERVICAL SPINE: 1. No acute traumatic injury within the cervical spine. 2. Straightening of the normal cervical lordosis, which may related to positioning or muscular spasm. 3. Moderate degenerative spondylolysis at C4-5 through C7-T1. Electronically Signed   By: Jeannine Boga M.D.   On: 03/19/2015 06:16   I have personally reviewed and evaluated these images and lab results as part of my medical decision-making.   EKG Interpretation   Date/Time:  Monday March 19 2015 07:35:04 EST Ventricular Rate:  48 PR Interval:  196 QRS Duration: 111 QT Interval:  461 QTC Calculation: 412 R Axis:    -46 Text Interpretation:  Sinus bradycardia LAD, consider left anterior  fascicular block Probable anteroseptal infarct, old Baseline wander in  lead(s) V5 no wpw, prolonged qt or brugada No significant change since  last tracing Confirmed by Ka Flammer MD, Quillian Quince [id number]) on 03/19/2015 5:58:14 PM      MDM   Final diagnoses:  Syncope and collapse    70 yo M with a fall due to syncope.  No hx of chf, BP normal on arrival.  Patient feeling at baseline.  By hx likely due to hypovolemia.  Given fluids with improvement.  CT head and c spine negative.  Lab eval due to syncope with no significant findings.  Patient able to ambulate without difficulty.  Lacs repaired at bedside.  Discussed limited utility in admission, d/c home.     I have discussed the diagnosis/risks/treatment options with the patient and family and believe the pt to be eligible for discharge home to follow-up with PCP. We also discussed returning to the ED immediately if new or worsening sx occur. We discussed the sx which are most  concerning (e.g., sudden worsening headache, repeat syncope. Chest pain, sob. Erythema drainage or fever) that necessitate immediate return. Medications administered to the patient during their visit and any new prescriptions provided to the patient are listed below.  Medications given during this visit Medications  sodium chloride 0.9 % bolus 1,000 mL (0 mLs Intravenous Stopped 03/19/15 0703)  lidocaine-EPINEPHrine (XYLOCAINE W/EPI) 1 %-1:100000 (with pres) injection 20 mL (20 mLs Intradermal Given by Other 03/19/15 0550)    Discharge Medication List as of 03/19/2015  7:07 AM      The patient appears reasonably screen and/or stabilized for discharge and I doubt any other medical condition or other [facility] requiring further screening, evaluation, or treatment in the ED at this time prior to discharge.      Deno Etienne, DO 03/19/15 Evalee Jefferson

## 2015-03-22 DIAGNOSIS — I10 Essential (primary) hypertension: Secondary | ICD-10-CM | POA: Diagnosis not present

## 2015-03-22 DIAGNOSIS — Z6827 Body mass index (BMI) 27.0-27.9, adult: Secondary | ICD-10-CM | POA: Diagnosis not present

## 2015-03-22 DIAGNOSIS — Z8679 Personal history of other diseases of the circulatory system: Secondary | ICD-10-CM | POA: Diagnosis not present

## 2015-03-22 DIAGNOSIS — R569 Unspecified convulsions: Secondary | ICD-10-CM | POA: Diagnosis not present

## 2015-03-22 DIAGNOSIS — T1490 Injury, unspecified: Secondary | ICD-10-CM | POA: Diagnosis not present

## 2015-03-22 DIAGNOSIS — Z125 Encounter for screening for malignant neoplasm of prostate: Secondary | ICD-10-CM | POA: Diagnosis not present

## 2015-03-22 DIAGNOSIS — R8290 Unspecified abnormal findings in urine: Secondary | ICD-10-CM | POA: Diagnosis not present

## 2015-03-22 DIAGNOSIS — S0101XA Laceration without foreign body of scalp, initial encounter: Secondary | ICD-10-CM | POA: Diagnosis not present

## 2015-03-22 DIAGNOSIS — J209 Acute bronchitis, unspecified: Secondary | ICD-10-CM | POA: Diagnosis not present

## 2015-03-22 DIAGNOSIS — I2510 Atherosclerotic heart disease of native coronary artery without angina pectoris: Secondary | ICD-10-CM | POA: Diagnosis not present

## 2015-03-26 DIAGNOSIS — T1490 Injury, unspecified: Secondary | ICD-10-CM | POA: Diagnosis not present

## 2015-03-26 DIAGNOSIS — Z8679 Personal history of other diseases of the circulatory system: Secondary | ICD-10-CM | POA: Diagnosis not present

## 2015-03-26 DIAGNOSIS — Z6826 Body mass index (BMI) 26.0-26.9, adult: Secondary | ICD-10-CM | POA: Diagnosis not present

## 2015-03-26 DIAGNOSIS — G4733 Obstructive sleep apnea (adult) (pediatric): Secondary | ICD-10-CM | POA: Diagnosis not present

## 2015-03-26 DIAGNOSIS — S0101XA Laceration without foreign body of scalp, initial encounter: Secondary | ICD-10-CM | POA: Diagnosis not present

## 2015-03-26 DIAGNOSIS — R569 Unspecified convulsions: Secondary | ICD-10-CM | POA: Diagnosis not present

## 2015-03-26 DIAGNOSIS — I2510 Atherosclerotic heart disease of native coronary artery without angina pectoris: Secondary | ICD-10-CM | POA: Diagnosis not present

## 2015-03-26 DIAGNOSIS — Z1389 Encounter for screening for other disorder: Secondary | ICD-10-CM | POA: Diagnosis not present

## 2015-03-26 DIAGNOSIS — Z23 Encounter for immunization: Secondary | ICD-10-CM | POA: Diagnosis not present

## 2015-03-26 DIAGNOSIS — I10 Essential (primary) hypertension: Secondary | ICD-10-CM | POA: Diagnosis not present

## 2015-03-26 DIAGNOSIS — Z0000 Encounter for general adult medical examination without abnormal findings: Secondary | ICD-10-CM | POA: Diagnosis not present

## 2015-03-26 DIAGNOSIS — E784 Other hyperlipidemia: Secondary | ICD-10-CM | POA: Diagnosis not present

## 2015-03-27 ENCOUNTER — Other Ambulatory Visit: Payer: Self-pay | Admitting: Cardiology

## 2015-03-27 MED ORDER — CLOPIDOGREL BISULFATE 75 MG PO TABS: ORAL_TABLET | ORAL | Status: DC

## 2015-03-27 MED ORDER — AMLODIPINE BESYLATE 5 MG PO TABS: ORAL_TABLET | ORAL | Status: DC

## 2015-03-27 MED ORDER — OLMESARTAN MEDOXOMIL 40 MG PO TABS
40.0000 mg | ORAL_TABLET | Freq: Every day | ORAL | Status: DC
Start: 2015-03-27 — End: 2015-03-27

## 2015-03-27 MED ORDER — OLMESARTAN MEDOXOMIL 40 MG PO TABS: 40.0000 mg | ORAL_TABLET | Freq: Every day | ORAL | Status: DC

## 2015-03-30 DIAGNOSIS — Z1212 Encounter for screening for malignant neoplasm of rectum: Secondary | ICD-10-CM | POA: Diagnosis not present

## 2015-04-05 DIAGNOSIS — L738 Other specified follicular disorders: Secondary | ICD-10-CM | POA: Diagnosis not present

## 2015-04-05 DIAGNOSIS — L820 Inflamed seborrheic keratosis: Secondary | ICD-10-CM | POA: Diagnosis not present

## 2015-04-05 DIAGNOSIS — D692 Other nonthrombocytopenic purpura: Secondary | ICD-10-CM | POA: Diagnosis not present

## 2015-04-10 ENCOUNTER — Telehealth: Payer: Self-pay | Admitting: Cardiology

## 2015-04-10 ENCOUNTER — Other Ambulatory Visit: Payer: Self-pay | Admitting: *Deleted

## 2015-04-10 MED ORDER — ATORVASTATIN CALCIUM 40 MG PO TABS: ORAL_TABLET | ORAL | Status: DC

## 2015-04-10 NOTE — Telephone Encounter (Signed)
Spoke with pt, refill sent to the pharmacy. 

## 2015-04-10 NOTE — Telephone Encounter (Signed)
[name] is calling because he has a question related to one of his medication ( Atorovastatin ) that he is taking . Please call   Thanks

## 2015-05-21 DIAGNOSIS — H25013 Cortical age-related cataract, bilateral: Secondary | ICD-10-CM | POA: Diagnosis not present

## 2015-05-21 DIAGNOSIS — H2513 Age-related nuclear cataract, bilateral: Secondary | ICD-10-CM | POA: Diagnosis not present

## 2015-05-21 DIAGNOSIS — Z0100 Encounter for examination of eyes and vision without abnormal findings: Secondary | ICD-10-CM | POA: Diagnosis not present

## 2015-06-22 DIAGNOSIS — Z85828 Personal history of other malignant neoplasm of skin: Secondary | ICD-10-CM | POA: Diagnosis not present

## 2015-06-22 DIAGNOSIS — D692 Other nonthrombocytopenic purpura: Secondary | ICD-10-CM | POA: Diagnosis not present

## 2015-06-22 DIAGNOSIS — L817 Pigmented purpuric dermatosis: Secondary | ICD-10-CM | POA: Diagnosis not present

## 2015-06-22 DIAGNOSIS — L812 Freckles: Secondary | ICD-10-CM | POA: Diagnosis not present

## 2015-06-22 DIAGNOSIS — L820 Inflamed seborrheic keratosis: Secondary | ICD-10-CM | POA: Diagnosis not present

## 2015-06-22 DIAGNOSIS — L821 Other seborrheic keratosis: Secondary | ICD-10-CM | POA: Diagnosis not present

## 2015-06-22 DIAGNOSIS — L720 Epidermal cyst: Secondary | ICD-10-CM | POA: Diagnosis not present

## 2015-06-22 DIAGNOSIS — L570 Actinic keratosis: Secondary | ICD-10-CM | POA: Diagnosis not present

## 2015-08-14 ENCOUNTER — Telehealth: Payer: Self-pay | Admitting: Neurology

## 2015-08-14 NOTE — Telephone Encounter (Signed)
Message For: OFFICE               Taken 30-MAY-17 at 12:37PM by JAB ------------------------------------------------------------  [name] [facility]                 CID  [id number]   Patient  SAME                  Pt's Dr  Jannifer Franklin        Area Code  336  Phone#  [phone] [place]  [birth year]      RE  RETURNING A CALL ABOUT AN APPT, P/C BACK                                                                Disp:Y/N  N  If Y = C/B If No Response In 62minutes  ============================================================  Pt r/s to July 25th at 1030am

## 2015-08-23 ENCOUNTER — Encounter: Payer: Self-pay | Admitting: Cardiology

## 2015-08-23 ENCOUNTER — Ambulatory Visit (INDEPENDENT_AMBULATORY_CARE_PROVIDER_SITE_OTHER): Payer: PPO | Admitting: Cardiology

## 2015-08-23 VITALS — BP 144/72 | HR 62 | Ht 73.0 in | Wt 199.0 lb

## 2015-08-23 DIAGNOSIS — I2510 Atherosclerotic heart disease of native coronary artery without angina pectoris: Secondary | ICD-10-CM

## 2015-08-23 DIAGNOSIS — E7800 Pure hypercholesterolemia, unspecified: Secondary | ICD-10-CM | POA: Diagnosis not present

## 2015-08-23 DIAGNOSIS — I10 Essential (primary) hypertension: Secondary | ICD-10-CM

## 2015-08-23 NOTE — Progress Notes (Signed)
[name] Date of Birth: [birth year] Medical Record [id number]  History of Present Illness: [name] is seen back today for follow up CAD.  He is status post extensive stenting of the LAD with Cypher stents in the past. He had stenting of the right coronary as well. He has a diagonal branch that was jailed by the stents. Followup stress testing has been normal last in 2012 and stress Myoview in Dec. 2015 was normal.  On follow up today he is doing well.  He denies any SOB. He states that since his amlodipine dose was increased he really hasn't had any angina. This past week he was walking uphill for 300 ft and did develop chest tightness that resolved when he rested. He realized that he had forgotten to take his amlodipine that morning.  He is very active working out 3 times a week, doing heavy yard work and Marketing executive.    Current Outpatient Prescriptions on File Prior to Visit  Medication Sig Dispense Refill  . amLODipine (NORVASC) 5 MG tablet Take 1 tablet (5 mg total)  by mouth 2 (two) times  daily 180 tablet 1  . aspirin 81 MG tablet Take 81 mg by mouth daily.      Marland Kitchen atorvastatin (LIPITOR) 40 MG tablet TAKE 1 BY MOUTH DAILY 90 tablet 3  . carbamazepine (TEGRETOL) 200 MG tablet take 1 tablet by mouth four times a day 360 tablet 3  . clopidogrel (PLAVIX) 75 MG tablet Take 1 tablet by mouth  daily 90 tablet 1  . hydrochlorothiazide (MICROZIDE) 12.5 MG capsule Take 12.5 mg by mouth daily.    . nebivolol (BYSTOLIC) 10 MG tablet Take 1 tablet (10 mg total) by mouth daily. 90 tablet 3  . olmesartan (BENICAR) 40 MG tablet Take 1 tablet (40 mg total) by mouth daily. 90 tablet 1  . valACYclovir (VALTREX) 500 MG tablet Take 250 mg by mouth every other day.      No current facility-administered medications on file prior to visit.    No Known Allergies  Past Medical History  Diagnosis Date  . Hypertension   . Coronary artery disease     s/p extensive stenting of the mid to distal LAD with 3  Cypher stents and past stenting of the mid RCA. He does have a diagonal branch that was jailed. He has stable angina. Negative myoview in October of 2012; Managed medically.   . Hypercholesterolemia   . Seizure disorder ([place])   . Knee pain   . Vagal reaction   . Cervical spine disease     History of cervical spine disease  . Sleep apnea     use to wear cpap    Past Surgical History  Procedure Laterality Date  . Shoulder arthroscopy      right  . Vasectomy    . Coronary artery stent placement    . Hernia repair      right groin  . Colonoscopy      History  Smoking status  . Former Smoker  . Quit date: 07/03/1965  Smokeless tobacco  . Never Used    History  Alcohol Use  . 0.0 oz/week  . 0 Standard drinks or equivalent per week    Comment: 2 beers/month    Family History  Problem Relation Age of Onset  . Heart failure Father 11  . Hyperlipidemia Mother   . Colon cancer Neg Hx     Review of Systems: The review of systems is per  the HPI.  . All other systems were reviewed and are negative.  Physical Exam: BP 144/72 mmHg  Pulse 62  Ht 6\' 1"  (1.854 m)  Wt 199 lb (90.266 kg)  BMI 26.26 kg/m2 Patient is very pleasant and in no acute distress. Skin is warm and dry. Color is normal.  HEENT is unremarkable. Normocephalic/atraumatic. PERRL. Sclera are nonicteric. Neck is supple. No masses. No JVD. Lungs are clear. Cardiac exam shows a regular rate and rhythm. Normal S1-2. No gallop or murmur. Abdomen is soft. Extremities are without edema. Gait and ROM are intact. No gross neurologic deficits noted.   LABORATORY DATA:    Assessment / Plan: 1. Coronary disease with prior extensive stenting of the LAD and right coronaries with first generation Cypher stents. Anginal symptoms are stable on medical therapy. Myoview in Dec. 2015  showed no evidence of ischemia. Continue DAPT with ASA and Plavix long term.   2. Hypertension. Blood pressure is well controlled today.   3.  Hypercholesterolemia. Continue atorvastatin at current dose. Higher doses of statins resulted in myalgias. He does note some calf soreness but at this point it is not bad enough to change therapy. Labs monitored by Dr. Reynaldo Minium.  4. Chronotropic incompetence. Noted on prior stress test and beta blocker was reduced.  Will need to watch BP and HR response.

## 2015-08-23 NOTE — Patient Instructions (Signed)
Continue your current therapy  I will see you in 6-8 months. 

## 2015-09-17 ENCOUNTER — Other Ambulatory Visit: Payer: Self-pay | Admitting: Cardiology

## 2015-09-17 MED ORDER — AMLODIPINE BESYLATE 5 MG PO TABS: ORAL_TABLET | ORAL | Status: DC

## 2015-09-17 MED ORDER — OLMESARTAN MEDOXOMIL 40 MG PO TABS: 40.0000 mg | ORAL_TABLET | Freq: Every day | ORAL | Status: DC

## 2015-09-17 MED ORDER — CLOPIDOGREL BISULFATE 75 MG PO TABS: ORAL_TABLET | ORAL | Status: DC

## 2015-09-17 NOTE — Telephone Encounter (Signed)
Rx request sent to pharmacy.  

## 2015-09-26 DIAGNOSIS — K219 Gastro-esophageal reflux disease without esophagitis: Secondary | ICD-10-CM | POA: Diagnosis not present

## 2015-09-26 DIAGNOSIS — R569 Unspecified convulsions: Secondary | ICD-10-CM | POA: Diagnosis not present

## 2015-09-26 DIAGNOSIS — Z8679 Personal history of other diseases of the circulatory system: Secondary | ICD-10-CM | POA: Diagnosis not present

## 2015-09-26 DIAGNOSIS — G4733 Obstructive sleep apnea (adult) (pediatric): Secondary | ICD-10-CM | POA: Diagnosis not present

## 2015-09-26 DIAGNOSIS — I10 Essential (primary) hypertension: Secondary | ICD-10-CM | POA: Diagnosis not present

## 2015-09-26 DIAGNOSIS — Z6826 Body mass index (BMI) 26.0-26.9, adult: Secondary | ICD-10-CM | POA: Diagnosis not present

## 2015-09-26 DIAGNOSIS — E784 Other hyperlipidemia: Secondary | ICD-10-CM | POA: Diagnosis not present

## 2015-09-26 DIAGNOSIS — G500 Trigeminal neuralgia: Secondary | ICD-10-CM | POA: Diagnosis not present

## 2015-09-26 DIAGNOSIS — I2510 Atherosclerotic heart disease of native coronary artery without angina pectoris: Secondary | ICD-10-CM | POA: Diagnosis not present

## 2015-10-01 ENCOUNTER — Ambulatory Visit: Payer: PPO | Admitting: Neurology

## 2015-10-05 DIAGNOSIS — L738 Other specified follicular disorders: Secondary | ICD-10-CM | POA: Diagnosis not present

## 2015-10-05 DIAGNOSIS — Z85828 Personal history of other malignant neoplasm of skin: Secondary | ICD-10-CM | POA: Diagnosis not present

## 2015-10-05 DIAGNOSIS — L820 Inflamed seborrheic keratosis: Secondary | ICD-10-CM | POA: Diagnosis not present

## 2015-10-09 ENCOUNTER — Ambulatory Visit (INDEPENDENT_AMBULATORY_CARE_PROVIDER_SITE_OTHER): Payer: PPO | Admitting: Neurology

## 2015-10-09 ENCOUNTER — Encounter: Payer: Self-pay | Admitting: Neurology

## 2015-10-09 VITALS — BP 160/74 | HR 50 | Ht 73.0 in | Wt 198.0 lb

## 2015-10-09 DIAGNOSIS — Z5181 Encounter for therapeutic drug level monitoring: Secondary | ICD-10-CM | POA: Diagnosis not present

## 2015-10-09 DIAGNOSIS — G40909 Epilepsy, unspecified, not intractable, without status epilepticus: Secondary | ICD-10-CM

## 2015-10-09 NOTE — Progress Notes (Signed)
Reason for visit: Seizures  [name] is an 70 y.o. male  History of present illness:  [name] is a 70 year old right-handed white male with a history of seizures that have been well controlled of carbamazepine. The patient was in the emergency room in January 2017 with a syncopal event that likely was not a seizure. The patient got up quickly out of bed to go the bathroom, he started feeling diaphoretic, nauseated, lightheaded. The patient try to make it back to the bed, but he passed out and hit the back of his head with a laceration requiring attention through the emergency room. The patient has not had any true seizure events since last seen. The patient is operating motor vehicle without difficulty. He is tolerating the carbamazepine well.  Past Medical History:  Diagnosis Date  . Cervical spine disease    History of cervical spine disease  . Coronary artery disease    s/p extensive stenting of the mid to distal LAD with 3 Cypher stents and past stenting of the mid RCA. He does have a diagonal branch that was jailed. He has stable angina. Negative myoview in October of 2012; Managed medically.   . Hypercholesterolemia   . Hypertension   . Knee pain   . Seizure disorder ([place])   . Sleep apnea    use to wear cpap  . Vagal reaction     Past Surgical History:  Procedure Laterality Date  . COLONOSCOPY    . coronary artery stent placement    . HERNIA REPAIR     right groin  . SHOULDER ARTHROSCOPY     right  . VASECTOMY      Family History  Problem Relation Age of Onset  . Heart failure Father 68  . Hyperlipidemia Mother   . Colon cancer Neg Hx     Social history:  reports that he quit smoking about 50 years ago. He has never used smokeless tobacco. He reports that he drinks alcohol. He reports that he does not use drugs.   No Known Allergies  Medications:  Prior to Admission medications   Medication Sig Start Date End Date Taking? Authorizing Provider    amLODipine (NORVASC) 5 MG tablet Take 1 tablet (5 mg total)  by mouth 2 (two) times  daily 09/17/15  Yes Peter M Martinique, MD  aspirin 81 MG tablet Take 81 mg by mouth daily.     Yes Historical Provider, MD  atorvastatin (LIPITOR) 40 MG tablet TAKE 1 BY MOUTH DAILY 04/10/15  Yes Peter M Martinique, MD  carbamazepine (TEGRETOL) 200 MG tablet take 1 tablet by mouth four times a day 11/16/14  Yes Kathrynn Ducking, MD  clopidogrel (PLAVIX) 75 MG tablet Take 1 tablet by mouth  daily 09/17/15  Yes Peter M Martinique, MD  hydrochlorothiazide (MICROZIDE) 12.5 MG capsule Take 12.5 mg by mouth daily.   Yes Historical Provider, MD  nebivolol (BYSTOLIC) 10 MG tablet Take 1 tablet (10 mg total) by mouth daily. 02/28/15  Yes Peter M Martinique, MD  olmesartan (BENICAR) 40 MG tablet Take 1 tablet (40 mg total) by mouth daily. 09/17/15  Yes Peter M Martinique, MD  valACYclovir (VALTREX) 500 MG tablet Take 250 mg by mouth every other day.    Yes Historical Provider, MD    ROS: Out of a complete 14 system review of symptoms, the patient complains only of the following symptoms, and all other reviewed systems are negative.  Joint pain, back pain Snoring  Blood pressure (!) 160/74, pulse (!) 50, height 6\' 1"  (1.854 m), weight 198 lb (89.8 kg).  Physical Exam  General: The patient is alert and cooperative at the time of the examination.  Skin: No significant peripheral edema is noted.   Neurologic Exam  Mental status: The patient is alert and oriented x 3 at the time of the examination. The patient has apparent normal recent and remote memory, with an apparently normal attention span and concentration ability.   Cranial nerves: Facial symmetry is present. Speech is normal, no aphasia or dysarthria is noted. Extraocular movements are full. Visual fields are full.  Motor: The patient has good strength in all 4 extremities.  Sensory examination: Soft touch sensation is symmetric on the face, arms, and legs.  Coordination: The  patient has good finger-nose-finger and heel-to-shin bilaterally.  Gait and station: The patient has a normal gait. Tandem gait is normal. Romberg is negative. No drift is seen.  Reflexes: Deep tendon reflexes are symmetric.   Assessment/Plan:  1. History of seizures  The patient is doing well at this time. He will continue the carbamazepine the current dose, he will have blood work done today. If he continues to do well, he will follow-up in one year.  Jill Alexanders MD 10/09/2015 11:29 AM  [facility] [address] [place], [place] [address]  Phone [phone] Fax [phone]

## 2015-10-10 LAB — CBC WITH DIFFERENTIAL/PLATELET
BASOS: 0 %
Basophils Absolute: 0 10*3/uL (ref 0.0–0.2)
EOS (ABSOLUTE): 0 10*3/uL (ref 0.0–0.4)
EOS: 1 %
HEMATOCRIT: 38.3 % (ref 37.5–51.0)
Hemoglobin: 13.3 g/dL (ref 12.6–17.7)
IMMATURE GRANS (ABS): 0 10*3/uL (ref 0.0–0.1)
IMMATURE GRANULOCYTES: 0 %
Lymphocytes Absolute: 1.4 10*3/uL (ref 0.7–3.1)
Lymphs: 22 %
MCH: 29 pg (ref 26.6–33.0)
MCHC: 34.7 g/dL (ref 31.5–35.7)
MCV: 84 fL (ref 79–97)
MONOS ABS: 0.7 10*3/uL (ref 0.1–0.9)
Monocytes: 11 %
NEUTROS ABS: 4.1 10*3/uL (ref 1.4–7.0)
NEUTROS PCT: 66 %
Platelets: 210 10*3/uL (ref 150–379)
RBC: 4.58 x10E6/uL (ref 4.14–5.80)
RDW: 14.5 % (ref 12.3–15.4)
WBC: 6.1 10*3/uL (ref 3.4–10.8)

## 2015-10-10 LAB — COMPREHENSIVE METABOLIC PANEL
A/G RATIO: 1.8 (ref 1.2–2.2)
ALBUMIN: 4.6 g/dL (ref 3.5–4.8)
ALT: 20 IU/L (ref 0–44)
AST: 22 IU/L (ref 0–40)
Alkaline Phosphatase: 94 IU/L (ref 39–117)
BUN/Creatinine Ratio: 22 (ref 10–24)
BUN: 27 mg/dL (ref 8–27)
Bilirubin Total: 0.2 mg/dL (ref 0.0–1.2)
CALCIUM: 9.4 mg/dL (ref 8.6–10.2)
CO2: 23 mmol/L (ref 18–29)
CREATININE: 1.24 mg/dL (ref 0.76–1.27)
Chloride: 94 mmol/L — ABNORMAL LOW (ref 96–106)
GFR, EST AFRICAN AMERICAN: 67 mL/min/{1.73_m2} (ref >59)
GFR, EST NON AFRICAN AMERICAN: 58 mL/min/{1.73_m2} — AB (ref >59)
GLOBULIN, TOTAL: 2.5 g/dL (ref 1.5–4.5)
Glucose: 105 mg/dL — ABNORMAL HIGH (ref 65–99)
Potassium: 5.3 mmol/L — ABNORMAL HIGH (ref 3.5–5.2)
SODIUM: 133 mmol/L — AB (ref 134–144)
TOTAL PROTEIN: 7.1 g/dL (ref 6.0–8.5)

## 2015-10-10 LAB — CARBAMAZEPINE LEVEL, TOTAL: Carbamazepine (Tegretol), S: 12 ug/mL (ref 4.0–12.0)

## 2015-10-22 NOTE — Telephone Encounter (Signed)
Closing encounter

## 2015-10-25 DIAGNOSIS — L720 Epidermal cyst: Secondary | ICD-10-CM | POA: Diagnosis not present

## 2015-10-25 DIAGNOSIS — Z85828 Personal history of other malignant neoplasm of skin: Secondary | ICD-10-CM | POA: Diagnosis not present

## 2015-10-25 DIAGNOSIS — L821 Other seborrheic keratosis: Secondary | ICD-10-CM | POA: Diagnosis not present

## 2015-10-25 DIAGNOSIS — L02212 Cutaneous abscess of back [any part, except buttock]: Secondary | ICD-10-CM | POA: Diagnosis not present

## 2015-10-31 NOTE — Telephone Encounter (Signed)
error 

## 2015-11-20 ENCOUNTER — Other Ambulatory Visit: Payer: Self-pay | Admitting: Neurology

## 2015-11-29 ENCOUNTER — Telehealth: Payer: Self-pay | Admitting: Cardiology

## 2015-11-29 NOTE — Telephone Encounter (Signed)
New message  Pt call requesting to speak with RN. Pt states he had a few questions about a medications. Pt did not want to disclose any further information. Please call back to discuss

## 2015-11-29 NOTE — Telephone Encounter (Signed)
Returned call to patient.He stated he has been having lower back pain with pain radiating down right leg for the past 3 to 4 weeks.Stated he wanted to ask Dr.Jordan if ok to take Voltaren.Stated he has a good friend who was going to give him a few to try.Advised Dr.Jordan is out of office this week.Advised he needs to see PCP.Stated he has a orthopedic Dr.Advised ok to see orthopedic Dr.Advised better to see Dr.since he has pain radiating down leg.Sounds like he needs to have xrays to find out what is causing pain.Stated he will call and schedule appointment.

## 2015-12-01 DIAGNOSIS — Z23 Encounter for immunization: Secondary | ICD-10-CM | POA: Diagnosis not present

## 2015-12-03 DIAGNOSIS — Z6827 Body mass index (BMI) 27.0-27.9, adult: Secondary | ICD-10-CM | POA: Diagnosis not present

## 2015-12-03 DIAGNOSIS — M545 Low back pain: Secondary | ICD-10-CM | POA: Diagnosis not present

## 2015-12-17 DIAGNOSIS — I739 Peripheral vascular disease, unspecified: Secondary | ICD-10-CM | POA: Diagnosis not present

## 2015-12-17 DIAGNOSIS — M791 Myalgia: Secondary | ICD-10-CM | POA: Diagnosis not present

## 2015-12-17 DIAGNOSIS — M545 Low back pain: Secondary | ICD-10-CM | POA: Diagnosis not present

## 2015-12-17 DIAGNOSIS — E784 Other hyperlipidemia: Secondary | ICD-10-CM | POA: Diagnosis not present

## 2015-12-17 DIAGNOSIS — Z6827 Body mass index (BMI) 27.0-27.9, adult: Secondary | ICD-10-CM | POA: Diagnosis not present

## 2015-12-19 DIAGNOSIS — Z6826 Body mass index (BMI) 26.0-26.9, adult: Secondary | ICD-10-CM | POA: Diagnosis not present

## 2015-12-19 DIAGNOSIS — M542 Cervicalgia: Secondary | ICD-10-CM | POA: Diagnosis not present

## 2015-12-19 DIAGNOSIS — I10 Essential (primary) hypertension: Secondary | ICD-10-CM | POA: Diagnosis not present

## 2015-12-20 ENCOUNTER — Ambulatory Visit (INDEPENDENT_AMBULATORY_CARE_PROVIDER_SITE_OTHER)
Admission: RE | Admit: 2015-12-20 | Discharge: 2015-12-20 | Disposition: A | Discharge status: Home / Self Care | Payer: PPO | Source: Ambulatory Visit | Attending: Vascular Surgery | Admitting: Vascular Surgery

## 2015-12-20 ENCOUNTER — Ambulatory Visit (HOSPITAL_COMMUNITY)
Admission: RE | Admit: 2015-12-20 | Discharge: 2015-12-20 | Disposition: A | Discharge status: Home / Self Care | Payer: PPO | Source: Ambulatory Visit | Attending: Vascular Surgery | Admitting: Vascular Surgery

## 2015-12-20 ENCOUNTER — Other Ambulatory Visit (HOSPITAL_COMMUNITY): Payer: Self-pay | Admitting: Internal Medicine

## 2015-12-20 DIAGNOSIS — E785 Hyperlipidemia, unspecified: Secondary | ICD-10-CM | POA: Diagnosis not present

## 2015-12-20 DIAGNOSIS — I10 Essential (primary) hypertension: Secondary | ICD-10-CM | POA: Diagnosis not present

## 2015-12-20 DIAGNOSIS — I739 Peripheral vascular disease, unspecified: Secondary | ICD-10-CM

## 2015-12-20 DIAGNOSIS — Z87891 Personal history of nicotine dependence: Secondary | ICD-10-CM | POA: Insufficient documentation

## 2015-12-21 DIAGNOSIS — M542 Cervicalgia: Secondary | ICD-10-CM | POA: Diagnosis not present

## 2016-01-08 DIAGNOSIS — M545 Low back pain: Secondary | ICD-10-CM | POA: Diagnosis not present

## 2016-01-08 DIAGNOSIS — Z6827 Body mass index (BMI) 27.0-27.9, adult: Secondary | ICD-10-CM | POA: Diagnosis not present

## 2016-01-08 DIAGNOSIS — G629 Polyneuropathy, unspecified: Secondary | ICD-10-CM | POA: Diagnosis not present

## 2016-01-08 DIAGNOSIS — M791 Myalgia: Secondary | ICD-10-CM | POA: Diagnosis not present

## 2016-01-08 DIAGNOSIS — E784 Other hyperlipidemia: Secondary | ICD-10-CM | POA: Diagnosis not present

## 2016-01-14 ENCOUNTER — Telehealth: Payer: Self-pay | Admitting: Cardiology

## 2016-01-14 NOTE — Telephone Encounter (Signed)
New message    Pt verbalized that he only wants to talk to rn

## 2016-01-14 NOTE — Telephone Encounter (Signed)
Spoke w patient. He's been having repeat issues w cholesterol meds and notes his most recent med discontinuation is the 4th statin drug he's tried and failed due to muscle fatigue/myalgia. Notes general intolerance to statins. He is proceeding w further steps and possibility of pursuing injectables has been discussed. He is going to look at some options led by his PCP and states he wants to follow up w Dr. Martinique on this at next appt (Dec). He is aware we have a lipid clinic at [facility], so this is available to him if he so chooses. He is aware that if he has any other questions, he may call. Pt voiced understanding, thanks for communication and prompt callback.

## 2016-01-23 DIAGNOSIS — L720 Epidermal cyst: Secondary | ICD-10-CM | POA: Diagnosis not present

## 2016-01-23 DIAGNOSIS — Z85828 Personal history of other malignant neoplasm of skin: Secondary | ICD-10-CM | POA: Diagnosis not present

## 2016-01-23 DIAGNOSIS — L02212 Cutaneous abscess of back [any part, except buttock]: Secondary | ICD-10-CM | POA: Diagnosis not present

## 2016-01-24 ENCOUNTER — Ambulatory Visit (INDEPENDENT_AMBULATORY_CARE_PROVIDER_SITE_OTHER): Payer: PPO | Admitting: Pharmacist

## 2016-01-24 ENCOUNTER — Encounter: Payer: Self-pay | Admitting: Pharmacist

## 2016-01-24 VITALS — Wt 201.8 lb

## 2016-01-24 DIAGNOSIS — E7800 Pure hypercholesterolemia, unspecified: Secondary | ICD-10-CM | POA: Diagnosis not present

## 2016-01-24 DIAGNOSIS — E785 Hyperlipidemia, unspecified: Secondary | ICD-10-CM | POA: Diagnosis not present

## 2016-01-24 NOTE — Patient Instructions (Signed)
Come into lab tomorrow morning (11/09) for a fasting lipid panel. Please make sure to fast at least 8 hours before having this test. The lab opens at 7:30 am.   Lipid clinic phone number [phone]

## 2016-01-24 NOTE — Progress Notes (Signed)
Patient ID: [name]                 DOB: 13-Dec-1945                    MRN: [id number]     HPI: [name] is a 70 y.o. male patient of Dr. Martinique presenting to lipid clinic for medication management. PMH of CAD with stents placed 2017, HTN, HLD, and seizure disorder. He called the clinic on 10/30 with concerns for repeat issues with statin medications. He reports having tried 4 different statins and all of them have caused fatigue/myalgias. He is interested in pursuing injectable therapies (PCSK9-I). PCP's office faxed over the Pt's most recent lipid panel and notes with previous medications.   Patient reports having been on different statin therapies for the last 11-12 years. He says that he seems to do well on therapy at first, but eventually becomes intolerant. He is currently not taking any cholesterol-lowering therapies and reports discontinuing Lipitor 7-8 weeks ago. He states his myalgias have improved upon discontinuation. Patient did clarify that pravastatin was discontinued not due to myalgias, but because it was not lowering LDL to desired goal.  Current Medications: None Intolerances: Lipitor 40 mg daily, Crestor, simvastatin with elevated CK 388, Vytorin (all caused myalgias with improvement upon stopping), pravastatin (minimal LDL lowering) Risk Factors: CAD with stents (12/20/2015), HTN LDL goal: <70 mg/dL  Diet: Reports dietary indiscretion including cheeseburgers.  Family History: HF (father), HLD (mother)  Social History: Former smoker of cigarettes. Quit on 07/03/1965. Denies using smokeless tobacco, endorses drinking 2 beers a month, denies illicit drug use.   Labs 03/22/2015: TC 151, TG 62, HDL 64, LDL 75 (on Lipitor 40mg  daily - discontinued 11/2015 due to myalgias)  Past Medical History:  Diagnosis Date  . Cervical spine disease    History of cervical spine disease  . Coronary artery disease    s/p extensive stenting of the mid to distal LAD with 3 Cypher stents  and past stenting of the mid RCA. He does have a diagonal branch that was jailed. He has stable angina. Negative myoview in October of 2012; Managed medically.   . Hypercholesterolemia   . Hypertension   . Knee pain   . Seizure disorder ([place])   . Sleep apnea    use to wear cpap  . Vagal reaction     Current Outpatient Prescriptions on File Prior to Visit  Medication Sig Dispense Refill  . amLODipine (NORVASC) 5 MG tablet Take 1 tablet (5 mg total)  by mouth 2 (two) times  daily 180 tablet 1  . aspirin 81 MG tablet Take 81 mg by mouth daily.      Marland Kitchen atorvastatin (LIPITOR) 40 MG tablet TAKE 1 BY MOUTH DAILY 90 tablet 3  . carbamazepine (TEGRETOL) 200 MG tablet take 1 tablet by mouth four times a day 360 tablet 3  . clopidogrel (PLAVIX) 75 MG tablet Take 1 tablet by mouth  daily 90 tablet 1  . hydrochlorothiazide (MICROZIDE) 12.5 MG capsule Take 12.5 mg by mouth daily.    . nebivolol (BYSTOLIC) 10 MG tablet Take 1 tablet (10 mg total) by mouth daily. 90 tablet 3  . olmesartan (BENICAR) 40 MG tablet Take 1 tablet (40 mg total) by mouth daily. 90 tablet 1  . valACYclovir (VALTREX) 500 MG tablet Take 250 mg by mouth every other day.      No current facility-administered medications on file prior to visit.  No Known Allergies  Assessment/Plan:  1. Hyperlipidemia - Patient has failed multiple statins over the past 12 years. Last lipid panel is from January 2017 when patient was taking Lipitor 40 mg daily in which Pt had a LDL of 75 mg/dL.Since he has discontinued Lipitor 7-8 weeks ago, we will need fasting lipid panel to assess new baseline LDL. LDL goal 70mg /dL given history of CAD s/p PCI. Scheduled lab appointment for tomorrow morning. Discussed with patient several options for lipid management including low-dose Crestor on an alternate dosing regimen, PCSK9-I therapy although with Medicare the copay can be costly, and enrolling in a clinical trial such as CLEAR. Pt is receptive to  starting low-dose Crestor, but wants to verify in his records if he's been on low-dose in the past. Will wait for the Pt to call the clinic with his findings and the lipid panel results to assess treatment moving forward.    Patient seen by Leroy Libman, [place] student.   [name] E. Supple, PharmD, [place] [id number] [address] [place], [place] [address] Phone: [phone]; Fax: [phone] 01/24/2016 3:53 PM

## 2016-01-25 ENCOUNTER — Other Ambulatory Visit: Payer: PPO | Admitting: *Deleted

## 2016-01-25 DIAGNOSIS — E785 Hyperlipidemia, unspecified: Secondary | ICD-10-CM | POA: Diagnosis not present

## 2016-01-25 LAB — LIPID PANEL
Cholesterol: 230 mg/dL — ABNORMAL HIGH (ref <200)
HDL: 77 mg/dL (ref >40)
LDL CALC: 141 mg/dL — AB (ref <100)
TRIGLYCERIDES: 60 mg/dL (ref <150)
Total CHOL/HDL Ratio: 3 Ratio (ref <5.0)
VLDL: 12 mg/dL (ref <30)

## 2016-01-28 ENCOUNTER — Telehealth: Payer: Self-pay | Admitting: Pharmacist

## 2016-01-28 DIAGNOSIS — E785 Hyperlipidemia, unspecified: Secondary | ICD-10-CM

## 2016-01-28 MED ORDER — ROSUVASTATIN CALCIUM 5 MG PO TABS: 5.0000 mg | ORAL_TABLET | Freq: Every day | ORAL | 11 refills | Status: DC

## 2016-01-28 NOTE — Telephone Encounter (Signed)
Spoke with patient regarding lipid panel results. LDL above goal <70 at 141. He is willing to rechallenge with low dose Crestor 5mg  3x per week. Rx sent in to pharmacy. Will recheck lipid panel in 2 months.

## 2016-02-04 DIAGNOSIS — M1288 Other specific arthropathies, not elsewhere classified, other specified site: Secondary | ICD-10-CM | POA: Diagnosis not present

## 2016-02-04 DIAGNOSIS — M5136 Other intervertebral disc degeneration, lumbar region: Secondary | ICD-10-CM | POA: Diagnosis not present

## 2016-02-28 ENCOUNTER — Telehealth: Payer: Self-pay

## 2016-02-28 NOTE — Telephone Encounter (Signed)
He may hold Plavix for 5 days for ortho procedure/injection  Shae Augello Martinique MD, [facility]

## 2016-02-28 NOTE — Telephone Encounter (Signed)
Received clearance from [facility] requesting patient to hold Plavix 5 days before steroid injection scheduled 03/04/16.Message sent to [place] for advice.

## 2016-02-29 NOTE — Telephone Encounter (Signed)
Returned call to patient.Dr.Jordan's recommendations given.He advised ok to hold aspirin 5 days prior too.Note faxed to [place] at fax # [phone].

## 2016-03-02 NOTE — Progress Notes (Signed)
[name] Date of Birth: 06-01-1945 Medical Record [id number]  History of Present Illness: [name] is seen back today for follow up CAD.  He is status post extensive stenting of the LAD with Cypher stents in the past. He had stenting of the right coronary as well. He has a diagonal branch that was jailed by the stents. Followup stress testing has been normal last in 2012 and stress Myoview in Dec. 2015 was normal. LE arterial dopplers were normal in October 2017.  On follow up today he is unchanged from a cardiac standpoint. He denies any SOB. He has mild chest pressure if he really pushes it uphill. More recently complained of increased leg pain. CK was elevated. lipitor was stopped. Now on low dose Crestor. He is being followed in our lipid clinic. Recent back pain and sciatica followed by ortho- scheduled for injections.   Current Outpatient Prescriptions on File Prior to Visit  Medication Sig Dispense Refill  . amLODipine (NORVASC) 5 MG tablet Take 1 tablet (5 mg total)  by mouth 2 (two) times  daily 180 tablet 1  . aspirin 81 MG tablet Take 81 mg by mouth daily.      . carbamazepine (TEGRETOL) 200 MG tablet take 1 tablet by mouth four times a day 360 tablet 3  . clopidogrel (PLAVIX) 75 MG tablet Take 1 tablet by mouth  daily 90 tablet 1  . hydrochlorothiazide (MICROZIDE) 12.5 MG capsule Take 12.5 mg by mouth daily.    . nebivolol (BYSTOLIC) 10 MG tablet Take 1 tablet (10 mg total) by mouth daily. 90 tablet 3  . olmesartan (BENICAR) 40 MG tablet Take 1 tablet (40 mg total) by mouth daily. 90 tablet 1  . rosuvastatin (CRESTOR) 5 MG tablet Take 1 tablet (5 mg total) by mouth daily. 30 tablet 11  . valACYclovir (VALTREX) 500 MG tablet Take 250 mg by mouth every other day.      No current facility-administered medications on file prior to visit.     No Known Allergies  Past Medical History:  Diagnosis Date  . Cervical spine disease    History of cervical spine disease  . Coronary  artery disease    s/p extensive stenting of the mid to distal LAD with 3 Cypher stents and past stenting of the mid RCA. He does have a diagonal branch that was jailed. He has stable angina. Negative myoview in October of 2012; Managed medically.   . Hypercholesterolemia   . Hypertension   . Knee pain   . Seizure disorder ([place])   . Sleep apnea    use to wear cpap  . Vagal reaction     Past Surgical History:  Procedure Laterality Date  . COLONOSCOPY    . coronary artery stent placement    . HERNIA REPAIR     right groin  . SHOULDER ARTHROSCOPY     right  . VASECTOMY      History  Smoking Status  . Former Smoker  . Quit date: 07/03/1965  Smokeless Tobacco  . Never Used    History  Alcohol Use  . 0.0 oz/week    Comment: 2 beers/month    Family History  Problem Relation Age of Onset  . Heart failure Father 49  . Hyperlipidemia Mother   . Colon cancer Neg Hx     Review of Systems: The review of systems is per the HPI.  . All other systems were reviewed and are negative.  Physical Exam: BP Marland Kitchen)  148/62   Pulse 74   Ht 6\' 1"  (1.854 m)   Wt 204 lb 12.8 oz (92.9 kg)   SpO2 98%   BMI 27.02 kg/m  Patient is very pleasant and in no acute distress. Skin is warm and dry. Color is normal.  HEENT is unremarkable. Normocephalic/atraumatic. PERRL. Sclera are nonicteric. Neck is supple. No masses. No JVD. Lungs are clear. Cardiac exam shows a regular rate and rhythm. Normal S1-2. No gallop or murmur. Abdomen is soft. Extremities are without edema. Gait and ROM are intact. No gross neurologic deficits noted.   LABORATORY DATA: Lab Results  Component Value Date   WBC 6.1 10/09/2015   HGB 14.3 03/19/2015   HCT 38.3 10/09/2015   PLT 210 10/09/2015   GLUCOSE 105 (H) 10/09/2015   CHOL 230 (H) 01/25/2016   TRIG 60 01/25/2016   HDL 77 01/25/2016   LDLCALC 141 (H) 01/25/2016   ALT 20 10/09/2015   AST 22 10/09/2015   NA 133 (L) 10/09/2015   K 5.3 (H) 10/09/2015   CL 94 (L)  10/09/2015   CREATININE 1.24 10/09/2015   BUN 27 10/09/2015   CO2 23 10/09/2015   INR 1.2 08/16/2007   Ecg today shows NSR with PVCs. LAFB. LVH, old septal infarct. I have personally reviewed and interpreted this study.    Assessment / Plan: 1. Coronary disease with prior extensive stenting of the LAD and right coronaries with first generation Cypher stents. Anginal symptoms are stable on medical therapy. Myoview in Dec. 2015  showed no evidence of ischemia. Continue DAPT with ASA and Plavix long term. He has class 1 angina that is stable.   2. Hypertension. Blood pressure is fairly well controlled today.   3. Hypercholesterolemia. Now followed in lipid clinic. On low dose Crestor. Above labs on no statin. Will see how he responds to the Crestor.  4. Chronotropic incompetence. Noted on prior stress test and beta blocker was reduced.  HR improved.

## 2016-03-04 ENCOUNTER — Ambulatory Visit (INDEPENDENT_AMBULATORY_CARE_PROVIDER_SITE_OTHER): Payer: PPO | Admitting: Cardiology

## 2016-03-04 ENCOUNTER — Encounter: Payer: Self-pay | Admitting: Cardiology

## 2016-03-04 VITALS — BP 148/62 | HR 74 | Ht 73.0 in | Wt 204.8 lb

## 2016-03-04 DIAGNOSIS — I10 Essential (primary) hypertension: Secondary | ICD-10-CM

## 2016-03-04 DIAGNOSIS — I2510 Atherosclerotic heart disease of native coronary artery without angina pectoris: Secondary | ICD-10-CM | POA: Diagnosis not present

## 2016-03-04 DIAGNOSIS — E7800 Pure hypercholesterolemia, unspecified: Secondary | ICD-10-CM

## 2016-03-04 NOTE — Patient Instructions (Addendum)
Continue your current therapy and follow up with the lipid clinic  I will see you in 6 months

## 2016-03-05 DIAGNOSIS — M5136 Other intervertebral disc degeneration, lumbar region: Secondary | ICD-10-CM | POA: Diagnosis not present

## 2016-03-11 ENCOUNTER — Telehealth: Payer: Self-pay | Admitting: Cardiology

## 2016-03-11 NOTE — Telephone Encounter (Signed)
New Message  Pt call requesting to speak with RN about his test that were complete last week. Please call back to discuss

## 2016-03-11 NOTE — Telephone Encounter (Signed)
Returned patient call-patient reports he saw his EKG on MyChart and was concerned.  He was in last week to see Dr. Martinique and nothing was mentioned regarding his EKG.  He was concerned about the findings on MyChart and thought EKG read "artery block" (EKG read abnormal and left "anterior" fascicular block).    Discussed the EKG with patient and reassured patient that MD Martinique reviewed the EKG at his appointment and is not concerned at this time.  Advised patient that MD always reviews EKGs when they are completed at [place].  Pt verbalized understanding.  Advised patient to call with further questions/concerns.

## 2016-03-18 ENCOUNTER — Other Ambulatory Visit: Payer: Self-pay | Admitting: Cardiology

## 2016-03-18 DIAGNOSIS — N390 Urinary tract infection, site not specified: Secondary | ICD-10-CM | POA: Diagnosis not present

## 2016-03-18 DIAGNOSIS — I10 Essential (primary) hypertension: Secondary | ICD-10-CM | POA: Diagnosis not present

## 2016-03-18 DIAGNOSIS — I480 Paroxysmal atrial fibrillation: Secondary | ICD-10-CM | POA: Diagnosis not present

## 2016-03-18 NOTE — Telephone Encounter (Signed)
Rx request sent to pharmacy.  

## 2016-03-19 DIAGNOSIS — M5136 Other intervertebral disc degeneration, lumbar region: Secondary | ICD-10-CM | POA: Diagnosis not present

## 2016-03-19 DIAGNOSIS — M4696 Unspecified inflammatory spondylopathy, lumbar region: Secondary | ICD-10-CM | POA: Diagnosis not present

## 2016-03-24 ENCOUNTER — Other Ambulatory Visit: Payer: Self-pay | Admitting: Cardiology

## 2016-03-24 DIAGNOSIS — I2510 Atherosclerotic heart disease of native coronary artery without angina pectoris: Secondary | ICD-10-CM

## 2016-03-28 DIAGNOSIS — Z125 Encounter for screening for malignant neoplasm of prostate: Secondary | ICD-10-CM | POA: Diagnosis not present

## 2016-03-28 DIAGNOSIS — E784 Other hyperlipidemia: Secondary | ICD-10-CM | POA: Diagnosis not present

## 2016-03-28 DIAGNOSIS — I10 Essential (primary) hypertension: Secondary | ICD-10-CM | POA: Diagnosis not present

## 2016-03-28 DIAGNOSIS — R569 Unspecified convulsions: Secondary | ICD-10-CM | POA: Diagnosis not present

## 2016-03-31 ENCOUNTER — Other Ambulatory Visit (INDEPENDENT_AMBULATORY_CARE_PROVIDER_SITE_OTHER): Payer: PPO

## 2016-03-31 DIAGNOSIS — E785 Hyperlipidemia, unspecified: Secondary | ICD-10-CM

## 2016-04-01 ENCOUNTER — Ambulatory Visit (INDEPENDENT_AMBULATORY_CARE_PROVIDER_SITE_OTHER): Payer: PPO | Admitting: Pharmacist

## 2016-04-01 DIAGNOSIS — E7800 Pure hypercholesterolemia, unspecified: Secondary | ICD-10-CM

## 2016-04-01 DIAGNOSIS — L570 Actinic keratosis: Secondary | ICD-10-CM | POA: Diagnosis not present

## 2016-04-01 DIAGNOSIS — L905 Scar conditions and fibrosis of skin: Secondary | ICD-10-CM | POA: Diagnosis not present

## 2016-04-01 DIAGNOSIS — L821 Other seborrheic keratosis: Secondary | ICD-10-CM | POA: Diagnosis not present

## 2016-04-01 DIAGNOSIS — Z85828 Personal history of other malignant neoplasm of skin: Secondary | ICD-10-CM | POA: Diagnosis not present

## 2016-04-01 LAB — LIPID PANEL
Chol/HDL Ratio: 2.2 ratio units (ref 0.0–5.0)
Cholesterol, Total: 163 mg/dL (ref 100–199)
HDL: 73 mg/dL (ref >39)
LDL CALC: 76 mg/dL (ref 0–99)
TRIGLYCERIDES: 70 mg/dL (ref 0–149)
VLDL Cholesterol Cal: 14 mg/dL (ref 5–40)

## 2016-04-01 NOTE — Progress Notes (Signed)
Patient ID: UGO BOURN                 DOB: 1945-07-03                    MRN: [id number]     HPI: [name] is a 71 y.o. male patient of Dr. Martinique presenting to lipid clinic for follow up. PMH of CAD with stents placed 2017, HTN, HLD, and seizure disorder. Pt has a history of intolerance to 4 statin medications over the past 11-12 years. At last visit, pt was started on Crestor 5mg  daily. He presents today for follow up.  LDL has improved from 141 to 76 since starting Crestor 5mg  daily. Pt has not had any adverse effects to Crestor. Reports some dietary indiscretion - eats many meals out at restaurants and does like breakfast meat. Has many questions regarding diet modification.  Current Medications: Crestor 5mg  daily  Intolerances: Lipitor 40 mg daily, Crestor, simvastatin with elevated CK 388, Vytorin (all caused myalgias with improvement upon stopping), pravastatin (minimal LDL lowering) Risk Factors: CAD with stents (12/20/2015), HTN LDL goal: <70 mg/dL  Diet: Breakfast meats a few days a week.   Exercise: goes to [employer] 2-3x per week  Family History: HF (father), HLD (mother)  Social History: Former smoker of cigarettes. Quit on 07/03/1965. Denies using smokeless tobacco, endorses drinking 2 beers a month, denies illicit drug use.   Labs: 03/31/16: TC 163, TG 70, HDL 73, LDL 76 (Crestor 5mg  daily) 01/25/16: TC 230, TG 60, HDL 77, LDL 141 (no therapy) 03/22/2015: TC 151, TG 62, HDL 64, LDL 75 (on Lipitor 40mg  daily - discontinued 11/2015 due to myalgias)  Past Medical History:  Diagnosis Date  . Cervical spine disease    History of cervical spine disease  . Coronary artery disease    s/p extensive stenting of the mid to distal LAD with 3 Cypher stents and past stenting of the mid RCA. He does have a diagonal branch that was jailed. He has stable angina. Negative myoview in October of 2012; Managed medically.   . Hypercholesterolemia   . Hypertension   . Knee pain     . Seizure disorder ([place])   . Sleep apnea    use to wear cpap  . Vagal reaction     Current Outpatient Prescriptions on File Prior to Visit  Medication Sig Dispense Refill  . amLODipine (NORVASC) 5 MG tablet take 1 tablet by mouth twice a day 180 tablet 1  . aspirin 81 MG tablet Take 81 mg by mouth daily.      Marland Kitchen BYSTOLIC 10 MG tablet take 1 tablet by mouth once daily 90 tablet 1  . carbamazepine (TEGRETOL) 200 MG tablet take 1 tablet by mouth four times a day 360 tablet 3  . clopidogrel (PLAVIX) 75 MG tablet take 1 tablet by mouth once daily 90 tablet 1  . hydrochlorothiazide (MICROZIDE) 12.5 MG capsule Take 12.5 mg by mouth daily.    Marland Kitchen olmesartan (BENICAR) 40 MG tablet take 1 tablet by mouth once daily 90 tablet 1  . rosuvastatin (CRESTOR) 5 MG tablet Take 1 tablet (5 mg total) by mouth daily. 30 tablet 11  . valACYclovir (VALTREX) 500 MG tablet Take 250 mg by mouth every other day.      No current facility-administered medications on file prior to visit.     No Known Allergies  Assessment/Plan:  1. Hyperlipidemia - LDL has improved from 141 to 76  since starting Crestor 5mg  daily. Given previous statin intolerances and pt being close to LDL goal of 70, pt prefers to work on dietary changes rather than increasing Crestor dose. He will work to cut back on breakfast meats like bacon and sausage. Follow up lipids as needed.   Elyna Pangilinan E. Ezariah Nace, PharmD, CPP, [place] [id number] [address], [place], [place] [address] Phone: [phone]; Fax: [phone] 04/01/2016 2:42 PM

## 2016-04-03 DIAGNOSIS — Z1212 Encounter for screening for malignant neoplasm of rectum: Secondary | ICD-10-CM | POA: Diagnosis not present

## 2016-04-03 DIAGNOSIS — Z0000 Encounter for general adult medical examination without abnormal findings: Secondary | ICD-10-CM | POA: Diagnosis not present

## 2016-04-03 DIAGNOSIS — I10 Essential (primary) hypertension: Secondary | ICD-10-CM | POA: Diagnosis not present

## 2016-04-03 DIAGNOSIS — J111 Influenza due to unidentified influenza virus with other respiratory manifestations: Secondary | ICD-10-CM | POA: Diagnosis not present

## 2016-04-03 DIAGNOSIS — Z6827 Body mass index (BMI) 27.0-27.9, adult: Secondary | ICD-10-CM | POA: Diagnosis not present

## 2016-04-03 DIAGNOSIS — Z1389 Encounter for screening for other disorder: Secondary | ICD-10-CM | POA: Diagnosis not present

## 2016-04-03 DIAGNOSIS — R569 Unspecified convulsions: Secondary | ICD-10-CM | POA: Diagnosis not present

## 2016-04-03 DIAGNOSIS — G629 Polyneuropathy, unspecified: Secondary | ICD-10-CM | POA: Diagnosis not present

## 2016-04-03 DIAGNOSIS — I2510 Atherosclerotic heart disease of native coronary artery without angina pectoris: Secondary | ICD-10-CM | POA: Diagnosis not present

## 2016-04-03 DIAGNOSIS — E784 Other hyperlipidemia: Secondary | ICD-10-CM | POA: Diagnosis not present

## 2016-04-10 DIAGNOSIS — J111 Influenza due to unidentified influenza virus with other respiratory manifestations: Secondary | ICD-10-CM | POA: Diagnosis not present

## 2016-04-10 DIAGNOSIS — E871 Hypo-osmolality and hyponatremia: Secondary | ICD-10-CM | POA: Diagnosis not present

## 2016-04-10 DIAGNOSIS — G4733 Obstructive sleep apnea (adult) (pediatric): Secondary | ICD-10-CM | POA: Diagnosis not present

## 2016-04-10 DIAGNOSIS — I10 Essential (primary) hypertension: Secondary | ICD-10-CM | POA: Diagnosis not present

## 2016-04-14 DIAGNOSIS — R8299 Other abnormal findings in urine: Secondary | ICD-10-CM | POA: Diagnosis not present

## 2016-04-14 DIAGNOSIS — N390 Urinary tract infection, site not specified: Secondary | ICD-10-CM | POA: Diagnosis not present

## 2016-04-14 DIAGNOSIS — E871 Hypo-osmolality and hyponatremia: Secondary | ICD-10-CM | POA: Diagnosis not present

## 2016-04-16 DIAGNOSIS — Z0000 Encounter for general adult medical examination without abnormal findings: Secondary | ICD-10-CM | POA: Diagnosis not present

## 2016-04-16 DIAGNOSIS — I10 Essential (primary) hypertension: Secondary | ICD-10-CM | POA: Diagnosis not present

## 2016-04-16 DIAGNOSIS — M791 Myalgia: Secondary | ICD-10-CM | POA: Diagnosis not present

## 2016-04-16 DIAGNOSIS — Z1389 Encounter for screening for other disorder: Secondary | ICD-10-CM | POA: Diagnosis not present

## 2016-04-16 DIAGNOSIS — E871 Hypo-osmolality and hyponatremia: Secondary | ICD-10-CM | POA: Diagnosis not present

## 2016-04-16 DIAGNOSIS — G500 Trigeminal neuralgia: Secondary | ICD-10-CM | POA: Diagnosis not present

## 2016-04-16 DIAGNOSIS — I739 Peripheral vascular disease, unspecified: Secondary | ICD-10-CM | POA: Diagnosis not present

## 2016-04-16 DIAGNOSIS — G629 Polyneuropathy, unspecified: Secondary | ICD-10-CM | POA: Diagnosis not present

## 2016-04-16 DIAGNOSIS — E784 Other hyperlipidemia: Secondary | ICD-10-CM | POA: Diagnosis not present

## 2016-04-16 DIAGNOSIS — M545 Low back pain: Secondary | ICD-10-CM | POA: Diagnosis not present

## 2016-04-16 DIAGNOSIS — G4733 Obstructive sleep apnea (adult) (pediatric): Secondary | ICD-10-CM | POA: Diagnosis not present

## 2016-04-16 DIAGNOSIS — I2510 Atherosclerotic heart disease of native coronary artery without angina pectoris: Secondary | ICD-10-CM | POA: Diagnosis not present

## 2016-04-17 ENCOUNTER — Telehealth: Payer: Self-pay | Admitting: Pharmacist

## 2016-04-17 MED ORDER — ROSUVASTATIN CALCIUM 5 MG PO TABS: 5.0000 mg | ORAL_TABLET | Freq: Every day | ORAL | 3 refills | Status: DC

## 2016-04-17 NOTE — Telephone Encounter (Signed)
Pt states he needs a 90 day supply of Crestor sent to [facility]. This has been done.

## 2016-04-22 DIAGNOSIS — Z1212 Encounter for screening for malignant neoplasm of rectum: Secondary | ICD-10-CM | POA: Diagnosis not present

## 2016-04-24 DIAGNOSIS — N401 Enlarged prostate with lower urinary tract symptoms: Secondary | ICD-10-CM | POA: Diagnosis not present

## 2016-04-24 DIAGNOSIS — R351 Nocturia: Secondary | ICD-10-CM | POA: Diagnosis not present

## 2016-04-24 DIAGNOSIS — R3129 Other microscopic hematuria: Secondary | ICD-10-CM | POA: Diagnosis not present

## 2016-05-26 DIAGNOSIS — E871 Hypo-osmolality and hyponatremia: Secondary | ICD-10-CM | POA: Diagnosis not present

## 2016-05-26 DIAGNOSIS — G6289 Other specified polyneuropathies: Secondary | ICD-10-CM | POA: Diagnosis not present

## 2016-05-26 DIAGNOSIS — M791 Myalgia: Secondary | ICD-10-CM | POA: Diagnosis not present

## 2016-05-26 DIAGNOSIS — K219 Gastro-esophageal reflux disease without esophagitis: Secondary | ICD-10-CM | POA: Diagnosis not present

## 2016-05-26 DIAGNOSIS — G4733 Obstructive sleep apnea (adult) (pediatric): Secondary | ICD-10-CM | POA: Diagnosis not present

## 2016-05-26 DIAGNOSIS — I2510 Atherosclerotic heart disease of native coronary artery without angina pectoris: Secondary | ICD-10-CM | POA: Diagnosis not present

## 2016-05-26 DIAGNOSIS — G500 Trigeminal neuralgia: Secondary | ICD-10-CM | POA: Diagnosis not present

## 2016-05-26 DIAGNOSIS — Z8679 Personal history of other diseases of the circulatory system: Secondary | ICD-10-CM | POA: Diagnosis not present

## 2016-05-26 DIAGNOSIS — M545 Low back pain: Secondary | ICD-10-CM | POA: Diagnosis not present

## 2016-05-26 DIAGNOSIS — Z6827 Body mass index (BMI) 27.0-27.9, adult: Secondary | ICD-10-CM | POA: Diagnosis not present

## 2016-05-26 DIAGNOSIS — I7389 Other specified peripheral vascular diseases: Secondary | ICD-10-CM | POA: Diagnosis not present

## 2016-05-26 DIAGNOSIS — E663 Overweight: Secondary | ICD-10-CM | POA: Diagnosis not present

## 2016-06-25 ENCOUNTER — Telehealth: Payer: Self-pay | Admitting: Pharmacist

## 2016-06-30 DIAGNOSIS — L821 Other seborrheic keratosis: Secondary | ICD-10-CM | POA: Diagnosis not present

## 2016-06-30 DIAGNOSIS — L570 Actinic keratosis: Secondary | ICD-10-CM | POA: Diagnosis not present

## 2016-06-30 DIAGNOSIS — L918 Other hypertrophic disorders of the skin: Secondary | ICD-10-CM | POA: Diagnosis not present

## 2016-06-30 DIAGNOSIS — Z85828 Personal history of other malignant neoplasm of skin: Secondary | ICD-10-CM | POA: Diagnosis not present

## 2016-06-30 DIAGNOSIS — L820 Inflamed seborrheic keratosis: Secondary | ICD-10-CM | POA: Diagnosis not present

## 2016-06-30 DIAGNOSIS — D692 Other nonthrombocytopenic purpura: Secondary | ICD-10-CM | POA: Diagnosis not present

## 2016-07-08 ENCOUNTER — Ambulatory Visit (INDEPENDENT_AMBULATORY_CARE_PROVIDER_SITE_OTHER): Payer: PPO | Admitting: Pharmacist

## 2016-07-08 DIAGNOSIS — E7800 Pure hypercholesterolemia, unspecified: Secondary | ICD-10-CM

## 2016-07-08 MED ORDER — PITAVASTATIN CALCIUM 2 MG PO TABS: 1.0000 | ORAL_TABLET | Freq: Every day | ORAL | 11 refills | Status: DC

## 2016-07-08 NOTE — Patient Instructions (Addendum)
Stop taking your Crestor and see if your symptoms improve. Once they do, start taking Livalo 2mg  daily.  Call Fey Coghill in the lipid clinic in 1 month with tolerability [phone].

## 2016-07-08 NOTE — Progress Notes (Signed)
Patient ID: [name]                 DOB: Nov 04, 1945                    MRN: [id number]     HPI: [name] a very pleasant 71 y.o.malepatient of Dr. Martinique presenting to lipid clinic for follow up. PMH is significant for CAD with stents placed in 2017, HTN, HLD, and seizure disorder. Pt has a history of intolerance to 4 statin medications over the past 11-12 years. Pt was previously started on Crestor 5mg  daily in November 2017. He tolerated this for some time and his LDL dropped from 141 to 76. However, after 5-6 months on Crestor, he developed joint aches in his hips and knees in addition to bilateral muscle aches in his thighs.  Pt has many questions about statin intolerance and timing of symptom onset. He mentions that one of his relatives takes Livalo and wonders if he should try it. He is now intolerant to Lipitor 40mg  daily, Crestor 5mg  daily, simvastatin, Vytorin, and pravastatin. He understands the importance of lowering his cholesterol since he had stents placed last fall, and is willing to retry statin medication.  Current Medications: Crestor 5mg  daily  Intolerances: Lipitor 40 mg daily, Crestor 5mg  daily, simvastatin with elevated CK 388, Vytorin(all caused myalgias with improvement upon stopping), pravastatin (minimal LDL lowering) Risk Factors: CAD with stents (12/2015), HTN, age LDL goal: <70 mg/dL  Diet: Breakfast meats a few days a week.   Exercise: Goes to Sport time 2-3x per week  Family History: HF (father), HLD (mother)  Social History: Former smoker of cigarettes. Quit on 07/03/1965. Denies using smokeless tobacco, endorses drinking 2 beers a month, denies illicit drug use.   Labs: 03/31/16: TC 163, TG 70, HDL 73, LDL 76 (Crestor 5mg  daily) 01/25/16: TC 230, TG 60, HDL 77, LDL 141 (no therapy) 03/22/2015: TC 151, TG 62, HDL 64, LDL 75 (on Lipitor 40mg  daily - discontinued 11/2015 due to myalgias)  Past Medical History:  Diagnosis Date  . Cervical  spine disease    History of cervical spine disease  . Coronary artery disease    s/p extensive stenting of the mid to distal LAD with 3 Cypher stents and past stenting of the mid RCA. He does have a diagonal branch that was jailed. He has stable angina. Negative myoview in October of 2012; Managed medically.   . Hypercholesterolemia   . Hypertension   . Knee pain   . Seizure disorder ([place])   . Sleep apnea    use to wear cpap  . Vagal reaction     Current Outpatient Prescriptions on File Prior to Visit  Medication Sig Dispense Refill  . amLODipine (NORVASC) 5 MG tablet take 1 tablet by mouth twice a day 180 tablet 1  . aspirin 81 MG tablet Take 81 mg by mouth daily.      Marland Kitchen BYSTOLIC 10 MG tablet take 1 tablet by mouth once daily 90 tablet 1  . carbamazepine (TEGRETOL) 200 MG tablet take 1 tablet by mouth four times a day 360 tablet 3  . clopidogrel (PLAVIX) 75 MG tablet take 1 tablet by mouth once daily 90 tablet 1  . hydrochlorothiazide (MICROZIDE) 12.5 MG capsule Take 12.5 mg by mouth daily.    Marland Kitchen olmesartan (BENICAR) 40 MG tablet take 1 tablet by mouth once daily 90 tablet 1  . rosuvastatin (CRESTOR) 5 MG tablet Take 1 tablet (  5 mg total) by mouth daily. 90 tablet 3  . valACYclovir (VALTREX) 500 MG tablet Take 250 mg by mouth every other day.      No current facility-administered medications on file prior to visit.     No Known Allergies  Assessment/Plan:  1. Hyperlipidemia - Pt's LDL improved from 141 to 76 since starting Crestor 5mg  daily, however pt has been experiencing joint and muscle aches. Will d/c Crestor and advised pt to call clinic if he does not notice symptom improvement in the next 2 weeks. Discussed rechallenging with statin therapy vs trial of Zetia vs PCSK9i. Pt would like to try Livalo since one of his relatives takes this and tolerates it well. Will start Livalo 2mg  daily in 2 weeks if side effects from Crestor have resolved by then. Confirmed copay is affordable  at $45 per month. Pt will call clinic with tolerability in 1 month. Can increase dose vs start Zetia at that time if needed. Pt would like to wait to pursue PCSK9i therapy until he has exhausted oral options.   Calah Gershman E. Penny Frisbie, PharmD, CPP, [facility] [address] [place], [place] [address] Phone: [phone]; Fax: [phone] 07/08/2016 11:39 AM

## 2016-07-11 DIAGNOSIS — E871 Hypo-osmolality and hyponatremia: Secondary | ICD-10-CM | POA: Diagnosis not present

## 2016-07-11 DIAGNOSIS — E784 Other hyperlipidemia: Secondary | ICD-10-CM | POA: Diagnosis not present

## 2016-07-11 DIAGNOSIS — I10 Essential (primary) hypertension: Secondary | ICD-10-CM | POA: Diagnosis not present

## 2016-07-14 NOTE — Telephone Encounter (Signed)
Statin intolerant appt made for lipid clinic

## 2016-07-29 DIAGNOSIS — E871 Hypo-osmolality and hyponatremia: Secondary | ICD-10-CM | POA: Diagnosis not present

## 2016-07-29 DIAGNOSIS — E784 Other hyperlipidemia: Secondary | ICD-10-CM | POA: Diagnosis not present

## 2016-07-29 DIAGNOSIS — Z6827 Body mass index (BMI) 27.0-27.9, adult: Secondary | ICD-10-CM | POA: Diagnosis not present

## 2016-07-29 DIAGNOSIS — I10 Essential (primary) hypertension: Secondary | ICD-10-CM | POA: Diagnosis not present

## 2016-07-30 ENCOUNTER — Telehealth: Payer: Self-pay | Admitting: Pharmacist

## 2016-07-30 DIAGNOSIS — E785 Hyperlipidemia, unspecified: Secondary | ICD-10-CM

## 2016-07-30 NOTE — Telephone Encounter (Signed)
Spoke with patient and he reports that he has been having leg issues and wants to discuss with Jinny Blossom about his statin medication. He prefers a call tomorrow morning. Advised will route to [facility] to call and discuss. He states appreciation.

## 2016-07-31 NOTE — Telephone Encounter (Signed)
Returned call to pt  - he states that he has still been having knee and hip pain. He thinks this may be due to arthritis. The aches in his thigh muscles have improved since stopping Crestor 3 weeks ago. He has not yet started Livalo and wanted to touch base with me before starting.   He is agreeable to start Livalo 2mg  daily on 08/11/16 so that he has a complete 4 week washout from Crestor. Scheduled f/u labs for August. He will also start CoQ10 200-400mg  daily if needed to help prevent myalgias.

## 2016-08-08 ENCOUNTER — Telehealth: Payer: Self-pay | Admitting: Cardiology

## 2016-08-08 NOTE — Telephone Encounter (Signed)
It is ok to try although benefit is really unproven. 100-200 mg daily. I don't think it will affect BP significantly.  Cherokee Boccio Martinique MD, [facility]

## 2016-08-08 NOTE — Telephone Encounter (Signed)
New Message     Pt has questions on stopping the Crestor and starting the Livalo please call

## 2016-08-08 NOTE — Telephone Encounter (Signed)
Spoke with pt, he has been working with the lipid clinic and it was recommended that he try taking CoQ10. He has been doing research and he wants to know what dr Martinique thinks about the supplement and if hr thinks it is worth trying. It can be expensive and can effect blood pressure per his research. Will forward to dr Martinique for his review and advise.

## 2016-08-12 NOTE — Telephone Encounter (Signed)
Pt.notified

## 2016-08-13 ENCOUNTER — Encounter: Payer: Self-pay | Admitting: Cardiology

## 2016-08-28 NOTE — Progress Notes (Signed)
[name] Date of Birth: November 12, 1945 Medical Record #[id number]  History of Present Illness: [name] is seen back today for follow up CAD.  He is status post extensive stenting of the LAD with Cypher stents in the past. He had stenting of the right coronary as well. He has a diagonal branch that was jailed by the stents. Followup stress testing has been normal last in 2012 and stress Myoview in Dec. 2015 was normal. LE arterial dopplers were normal in October 2017.  He has been followed in our lipid clinic. He has a history of intolerance to numerous statins. Was most recently on Crestor 5 mg daily and did well on this for 5-6 months but then developed myalgias. LDL decreased to 76. He was recently switched to Livalo 2 mg daily about 3 weeks ago and is tolerating it well. BP at home 500-938 systolic. HCTZ stopped due to hyponatremia.   Current Outpatient Prescriptions on File Prior to Visit  Medication Sig Dispense Refill  . amLODipine (NORVASC) 5 MG tablet take 1 tablet by mouth twice a day 180 tablet 1  . aspirin 81 MG tablet Take 81 mg by mouth daily.      Marland Kitchen BYSTOLIC 10 MG tablet take 1 tablet by mouth once daily 90 tablet 1  . carbamazepine (TEGRETOL) 200 MG tablet take 1 tablet by mouth four times a day 360 tablet 3  . clopidogrel (PLAVIX) 75 MG tablet take 1 tablet by mouth once daily 90 tablet 1  . hydrochlorothiazide (MICROZIDE) 12.5 MG capsule Take 12.5 mg by mouth daily.    Marland Kitchen olmesartan (BENICAR) 40 MG tablet take 1 tablet by mouth once daily 90 tablet 1  . Pitavastatin Calcium (LIVALO) 2 MG TABS Take 1 tablet (2 mg total) by mouth daily. 30 tablet 11  . valACYclovir (VALTREX) 500 MG tablet Take 250 mg by mouth every other day.      No current facility-administered medications on file prior to visit.     No Known Allergies  Past Medical History:  Diagnosis Date  . Cervical spine disease    History of cervical spine disease  . Coronary artery disease    s/p extensive  stenting of the mid to distal LAD with 3 Cypher stents and past stenting of the mid RCA. He does have a diagonal branch that was jailed. He has stable angina. Negative myoview in October of 2012; Managed medically.   . Hypercholesterolemia   . Hypertension   . Knee pain   . Seizure disorder ([place])   . Sleep apnea    use to wear cpap  . Vagal reaction     Past Surgical History:  Procedure Laterality Date  . COLONOSCOPY    . coronary artery stent placement    . HERNIA REPAIR     right groin  . SHOULDER ARTHROSCOPY     right  . VASECTOMY      History  Smoking Status  . Former Smoker  . Quit date: 07/03/1965  Smokeless Tobacco  . Never Used    History  Alcohol Use  . 0.0 oz/week    Comment: 2 beers/month    Family History  Problem Relation Age of Onset  . Heart failure Father 2  . Hyperlipidemia Mother   . Colon cancer Neg Hx     Review of Systems: The review of systems is per the HPI.  . All other systems were reviewed and are negative.  Physical Exam: BP (!) 164/82 (BP Location: Left  Arm)   Pulse 60   Ht 6\' 1"  (1.854 m)   Wt 201 lb (91.2 kg)   BMI 26.52 kg/m  Patient is very pleasant and in no acute distress. Skin is warm and dry. Color is normal.  HEENT is unremarkable. Normocephalic/atraumatic. PERRL. Sclera are nonicteric. Neck is supple. No masses. No JVD. Lungs are clear. Cardiac exam shows a regular rate and rhythm. Normal S1-2. No gallop or murmur. Abdomen is soft. Extremities are without edema. Gait and ROM are intact. No gross neurologic deficits noted.   LABORATORY DATA: Lab Results  Component Value Date   WBC 6.1 10/09/2015   HGB 13.3 10/09/2015   HCT 38.3 10/09/2015   PLT 210 10/09/2015   GLUCOSE 105 (H) 10/09/2015   CHOL 163 03/31/2016   TRIG 70 03/31/2016   HDL 73 03/31/2016   LDLCALC 76 03/31/2016   ALT 20 10/09/2015   AST 22 10/09/2015   NA 133 (L) 10/09/2015   K 5.3 (H) 10/09/2015   CL 94 (L) 10/09/2015   CREATININE 1.24 10/09/2015    BUN 27 10/09/2015   CO2 23 10/09/2015   INR 1.2 08/16/2007    Assessment / Plan: 1. Coronary disease with prior extensive stenting of the LAD and right coronaries with first generation Cypher stents. Anginal symptoms are stable on medical therapy and have actually improved over the years. Myoview in Dec. 2015  showed no evidence of ischemia. Continue DAPT with ASA and Plavix long term. He has class 1 angina that is stable.   2. Hypertension. Blood pressure is higher since HCTZ stopped. Options limited. If additional therapy needed would consider Cardura but would start low and watch for orthostasis.   3. Hypercholesterolemia. Now followed in lipid clinic. On Livalo now. Will monitor response.  4. Chronotropic incompetence. Noted on prior stress test and beta blocker was reduced.  HR improved.

## 2016-09-02 ENCOUNTER — Ambulatory Visit (INDEPENDENT_AMBULATORY_CARE_PROVIDER_SITE_OTHER): Payer: PPO | Admitting: Cardiology

## 2016-09-02 ENCOUNTER — Encounter: Payer: Self-pay | Admitting: Cardiology

## 2016-09-02 VITALS — BP 164/82 | HR 60 | Ht 73.0 in | Wt 201.0 lb

## 2016-09-02 DIAGNOSIS — E7800 Pure hypercholesterolemia, unspecified: Secondary | ICD-10-CM

## 2016-09-02 DIAGNOSIS — I2510 Atherosclerotic heart disease of native coronary artery without angina pectoris: Secondary | ICD-10-CM

## 2016-09-02 DIAGNOSIS — I10 Essential (primary) hypertension: Secondary | ICD-10-CM | POA: Diagnosis not present

## 2016-09-02 NOTE — Patient Instructions (Signed)
Continue your current therapy  I will see you in 6 months.   

## 2016-09-15 ENCOUNTER — Other Ambulatory Visit: Payer: Self-pay | Admitting: Cardiology

## 2016-09-15 NOTE — Telephone Encounter (Signed)
REFILL 

## 2016-09-29 ENCOUNTER — Other Ambulatory Visit: Payer: Self-pay | Admitting: Cardiology

## 2016-09-29 DIAGNOSIS — I2510 Atherosclerotic heart disease of native coronary artery without angina pectoris: Secondary | ICD-10-CM

## 2016-09-29 NOTE — Telephone Encounter (Signed)
Rx has been sent to the pharmacy electronically. ° °

## 2016-09-29 NOTE — Telephone Encounter (Signed)
New Message ° ° pt verbalized that he is returning call for rn  °

## 2016-10-08 DIAGNOSIS — Z8679 Personal history of other diseases of the circulatory system: Secondary | ICD-10-CM | POA: Diagnosis not present

## 2016-10-08 DIAGNOSIS — R569 Unspecified convulsions: Secondary | ICD-10-CM | POA: Diagnosis not present

## 2016-10-08 DIAGNOSIS — I10 Essential (primary) hypertension: Secondary | ICD-10-CM | POA: Diagnosis not present

## 2016-10-08 DIAGNOSIS — M545 Low back pain: Secondary | ICD-10-CM | POA: Diagnosis not present

## 2016-10-08 DIAGNOSIS — I2510 Atherosclerotic heart disease of native coronary artery without angina pectoris: Secondary | ICD-10-CM | POA: Diagnosis not present

## 2016-10-08 DIAGNOSIS — G500 Trigeminal neuralgia: Secondary | ICD-10-CM | POA: Diagnosis not present

## 2016-10-08 DIAGNOSIS — G629 Polyneuropathy, unspecified: Secondary | ICD-10-CM | POA: Diagnosis not present

## 2016-10-08 DIAGNOSIS — E871 Hypo-osmolality and hyponatremia: Secondary | ICD-10-CM | POA: Diagnosis not present

## 2016-10-08 DIAGNOSIS — Z6827 Body mass index (BMI) 27.0-27.9, adult: Secondary | ICD-10-CM | POA: Diagnosis not present

## 2016-10-08 DIAGNOSIS — E784 Other hyperlipidemia: Secondary | ICD-10-CM | POA: Diagnosis not present

## 2016-10-08 DIAGNOSIS — G4733 Obstructive sleep apnea (adult) (pediatric): Secondary | ICD-10-CM | POA: Diagnosis not present

## 2016-10-08 DIAGNOSIS — I739 Peripheral vascular disease, unspecified: Secondary | ICD-10-CM | POA: Diagnosis not present

## 2016-10-09 ENCOUNTER — Ambulatory Visit (INDEPENDENT_AMBULATORY_CARE_PROVIDER_SITE_OTHER): Payer: PPO | Admitting: Neurology

## 2016-10-09 ENCOUNTER — Encounter: Payer: Self-pay | Admitting: Neurology

## 2016-10-09 VITALS — BP 180/78 | HR 54 | Ht 73.0 in | Wt 201.0 lb

## 2016-10-09 DIAGNOSIS — G40909 Epilepsy, unspecified, not intractable, without status epilepticus: Secondary | ICD-10-CM

## 2016-10-09 NOTE — Progress Notes (Signed)
Reason for visit: Seizures  [name] is an 71 y.o. male  History of present illness:  [name] is a 71 year old right-handed white male with a history of seizures that have been well controlled with carbamazepine taking 200 mg 4 times daily. The patient had a blood level checked within the last 4 or 5 months with a level of 11.8. The patient at times feels slightly staggery on the medication, but this is not a persistent issue. The patient not had any falls. He does report a long-standing history of numbness in the feet that has gradually crept up the feet and is now at the ankle level. The patient has been on Dilantin prior to carbamazepine. The patient returns for an evaluation.  Past Medical History:  Diagnosis Date  . Cervical spine disease    History of cervical spine disease  . Coronary artery disease    s/p extensive stenting of the mid to distal LAD with 3 Cypher stents and past stenting of the mid RCA. He does have a diagonal branch that was jailed. He has stable angina. Negative myoview in October of 2012; Managed medically.   . Hypercholesterolemia   . Hypertension   . Knee pain   . Seizure disorder ([name])   . Sleep apnea    use to wear cpap  . Vagal reaction     Past Surgical History:  Procedure Laterality Date  . COLONOSCOPY    . coronary artery stent placement    . HERNIA REPAIR     right groin  . SHOULDER ARTHROSCOPY     right  . VASECTOMY      Family History  Problem Relation Age of Onset  . Heart failure Father 31  . Hyperlipidemia Mother   . Colon cancer Neg Hx     Social history:  reports that he quit smoking about 51 years ago. He has never used smokeless tobacco. He reports that he drinks alcohol. He reports that he does not use drugs.   No Known Allergies  Medications:  Prior to Admission medications   Medication Sig Start Date End Date Taking? Authorizing Provider  amLODipine (NORVASC) 5 MG tablet take 1 tablet by mouth twice a day  09/15/16  Yes Martinique, Peter M, MD  aspirin 81 MG tablet Take 81 mg by mouth daily.     Yes [provider]  BYSTOLIC 10 MG tablet take 1 tablet by mouth once daily 09/29/16  Yes Martinique, Peter M, MD  carbamazepine (TEGRETOL) 200 MG tablet take 1 tablet by mouth four times a day 11/20/15  Yes Kathrynn Ducking, MD  clopidogrel (PLAVIX) 75 MG tablet take 1 tablet by mouth once daily 09/15/16  Yes Martinique, Peter M, MD  olmesartan [facility]) 40 MG tablet take 1 tablet by mouth once daily 03/18/16  Yes Martinique, Peter M, MD  Pitavastatin Calcium (LIVALO) 2 MG TABS Take 1 tablet (2 mg total) by mouth daily. 07/08/16  Yes Martinique, Peter M, MD  valACYclovir (VALTREX) 500 MG tablet Take 250 mg by mouth every other day.    Yes [provider]    ROS:  Out of a complete 14 system review of symptoms, the patient complains only of the following symptoms, and all other reviewed systems are negative.  Hearing loss, ringing in the ears Joint pain, back pain  Blood pressure (!) 180/78, pulse (!) 54, height 6\' 1"  (1.854 m), weight 201 lb (91.2 kg), SpO2 98 %.  Physical Exam  General:  The patient is alert and cooperative at the time of the examination.  Skin: No significant peripheral edema is noted.   Neurologic Exam  Mental status: The patient is alert and oriented x 3 at the time of the examination. The patient has apparent normal recent and remote memory, with an apparently normal attention span and concentration ability.   Cranial nerves: Facial symmetry is present. Speech is normal, no aphasia or dysarthria is noted. Extraocular movements are full. Visual fields are full.  Motor: The patient has good strength in all 4 extremities.  Sensory examination: Soft touch sensation is symmetric on the face, arms, and legs.  Coordination: The patient has good finger-nose-finger and heel-to-shin bilaterally.  Gait and station: The patient has a normal gait. Tandem gait is normal. Romberg is  negative. No drift is seen.  Reflexes: Deep tendon reflexes are symmetric.   Assessment/Plan:  1. History of seizures, well controlled  The patient will continue carbamazepine, he has had a recent blood level that shows a therapeutic carbamazepine level. The patient will follow-up in one year, sooner if needed.  Jill Alexanders MD 10/09/2016 9:12 AM  [facility] [address] [place], [place] [address]  Phone [phone] Fax [phone]

## 2016-10-28 ENCOUNTER — Other Ambulatory Visit: Payer: PPO

## 2016-10-28 DIAGNOSIS — E785 Hyperlipidemia, unspecified: Secondary | ICD-10-CM

## 2016-10-28 LAB — LIPID PANEL
CHOLESTEROL TOTAL: 179 mg/dL (ref 100–199)
Chol/HDL Ratio: 2.6 ratio (ref 0.0–5.0)
HDL: 69 mg/dL (ref >39)
LDL CALC: 97 mg/dL (ref 0–99)
Triglycerides: 65 mg/dL (ref 0–149)
VLDL CHOLESTEROL CAL: 13 mg/dL (ref 5–40)

## 2016-10-28 LAB — HEPATIC FUNCTION PANEL
ALBUMIN: 4.4 g/dL (ref 3.5–4.8)
ALK PHOS: 82 IU/L (ref 39–117)
ALT: 13 IU/L (ref 0–44)
AST: 18 IU/L (ref 0–40)
Bilirubin Total: 0.3 mg/dL (ref 0.0–1.2)
Bilirubin, Direct: 0.1 mg/dL (ref 0.00–0.40)
TOTAL PROTEIN: 6.5 g/dL (ref 6.0–8.5)

## 2016-11-03 NOTE — Progress Notes (Signed)
Patient ID: [name]                 DOB: 02-23-1946                    MRN: [id number]     HPI: [name] a very pleasant 71 y.o.malepatient of Dr. Martinique presenting to lipid clinic for follow up. PMH is significant for CAD with stents placed in 2017, HTN, HLD, and seizure disorder. Pt has a history of intolerance to 4 statin medications over the past 11-12 years. Pt was previously started on Crestor 5mg  daily in November 2017. He tolerated this for some time and his LDL dropped from 141 to 76. However, after 5-6 months on Crestor, he developed joint aches in his hips and knees in addition to bilateral muscle aches in his thighs. At his last lipid visit, Crestor was discontinued and Livalo 2 mg daily was started after a 4 week washout period. Follow up lipid panel drawn 10/28/16 shows LDL of 97. Pt presents today for f/u.  Pt reports feeling well on Livalo 2mg  daily for the past 3 months. He inquires about the benefits of CoQ 10 and slow release niacin, as well as the need to lower his LDL below its current level of 97. He is hesitant to make any medication changes today given his multiple previous statin intolerances.  Current Medications: Livalo 2mg  daily   Intolerances: Lipitor 40 mg daily, Crestor 5mg  daily, simvastatin with elevated CK 388, Vytorin(all caused myalgias with improvement upon stopping), pravastatin (minimal LDL lowering)  Risk Factors: CAD with stents (12/2015), HTN, age  LDL goal: <70 mg/dL  Diet: Breakfast meats a few days a week   Exercise: Goes to Sport time 2-3x per week  Family History: HF (father), HLD (mother)  Social History: Former smoker of cigarettes. Quit on 07/03/1965. Denies using smokeless tobacco, endorses drinking 2 beers a month, denies illicit drug use.   Labs: 10/28/16: TC 179, TG 65, HDL 69, LDL 97 (Livalo 2mg  daily) 03/31/16: TC 163, TG 70, HDL 73, LDL 76 (Crestor 5mg  daily)  Past Medical History:  Diagnosis Date  . Cervical spine  disease    History of cervical spine disease  . Coronary artery disease    s/p extensive stenting of the mid to distal LAD with 3 Cypher stents and past stenting of the mid RCA. He does have a diagonal branch that was jailed. He has stable angina. Negative myoview in October of 2012; Managed medically.   . Hypercholesterolemia   . Hypertension   . Knee pain   . Seizure disorder ([name])   . Sleep apnea    use to wear cpap  . Vagal reaction     Current Outpatient Prescriptions on File Prior to Visit  Medication Sig Dispense Refill  . amLODipine (NORVASC) 5 MG tablet take 1 tablet by mouth twice a day 180 tablet 1  . aspirin 81 MG tablet Take 81 mg by mouth daily.      Marland Kitchen BYSTOLIC 10 MG tablet take 1 tablet by mouth once daily 90 tablet 3  . carbamazepine (TEGRETOL) 200 MG tablet take 1 tablet by mouth four times a day 360 tablet 3  . clopidogrel (PLAVIX) 75 MG tablet take 1 tablet by mouth once daily 90 tablet 1  . olmesartan (BENICAR) 40 MG tablet take 1 tablet by mouth once daily 90 tablet 1  . Pitavastatin Calcium (LIVALO) 2 MG TABS Take 1 tablet (2 mg total)  by mouth daily. 30 tablet 11  . valACYclovir (VALTREX) 500 MG tablet Take 250 mg by mouth every other day.      No current facility-administered medications on file prior to visit.     No Known Allergies  Assessment/Plan:  1. Hyperlipidemia - LDL currently 97mg /dL on Livalo 2mg  daily, above goal < 70mg /dL given hx of ASCVD. Pt is tolerating Livalo well but is hesitant to make any medication changes today. Discussed either increasing Livalo to 4mg  daily or adding Zetia 10mg  daily, either of which would likely bring LDL to goal. Pt prefers to continue current dose of Livalo for the next 2 months to ensure that he continues to tolerate it well. Will f/u in lipid clinic in 2 months and assess pt's readiness to make medication changes at that time.   [name] E. [name], PharmD, CPP, [facility] [address], [place], [place] [address] Phone: [phone]; Fax: [phone] 11/04/2016 9:41 AM

## 2016-11-04 ENCOUNTER — Ambulatory Visit (INDEPENDENT_AMBULATORY_CARE_PROVIDER_SITE_OTHER): Payer: PPO | Admitting: Pharmacist

## 2016-11-04 DIAGNOSIS — I10 Essential (primary) hypertension: Secondary | ICD-10-CM | POA: Diagnosis not present

## 2016-11-04 DIAGNOSIS — E7800 Pure hypercholesterolemia, unspecified: Secondary | ICD-10-CM

## 2016-11-04 NOTE — Patient Instructions (Signed)
It was nice to see you today!  Continue taking Livalo 2mg  each day - it has improved your LDL cholesterol from a baseline of 141 to 97.  Think about either increasing your Livalo to 4mg  daily or starting Zetia (ezetimibe) 10mg  daily to help bring your LDL < 70  Follow up on October 30th in lipid clinic

## 2016-11-12 DIAGNOSIS — Z6827 Body mass index (BMI) 27.0-27.9, adult: Secondary | ICD-10-CM | POA: Diagnosis not present

## 2016-11-12 DIAGNOSIS — I10 Essential (primary) hypertension: Secondary | ICD-10-CM | POA: Diagnosis not present

## 2016-11-18 ENCOUNTER — Other Ambulatory Visit: Payer: Self-pay | Admitting: Neurology

## 2016-11-27 DIAGNOSIS — Z85828 Personal history of other malignant neoplasm of skin: Secondary | ICD-10-CM | POA: Diagnosis not present

## 2016-11-27 DIAGNOSIS — C44529 Squamous cell carcinoma of skin of other part of trunk: Secondary | ICD-10-CM | POA: Diagnosis not present

## 2016-11-27 DIAGNOSIS — D485 Neoplasm of uncertain behavior of skin: Secondary | ICD-10-CM | POA: Diagnosis not present

## 2016-11-27 DIAGNOSIS — L918 Other hypertrophic disorders of the skin: Secondary | ICD-10-CM | POA: Diagnosis not present

## 2016-11-27 DIAGNOSIS — L820 Inflamed seborrheic keratosis: Secondary | ICD-10-CM | POA: Diagnosis not present

## 2016-11-27 DIAGNOSIS — L821 Other seborrheic keratosis: Secondary | ICD-10-CM | POA: Diagnosis not present

## 2016-12-02 DIAGNOSIS — C44529 Squamous cell carcinoma of skin of other part of trunk: Secondary | ICD-10-CM | POA: Diagnosis not present

## 2016-12-02 DIAGNOSIS — Z85828 Personal history of other malignant neoplasm of skin: Secondary | ICD-10-CM | POA: Diagnosis not present

## 2016-12-15 ENCOUNTER — Other Ambulatory Visit: Payer: Self-pay | Admitting: Cardiology

## 2016-12-15 NOTE — Telephone Encounter (Signed)
REFILL 

## 2016-12-24 DIAGNOSIS — Z6827 Body mass index (BMI) 27.0-27.9, adult: Secondary | ICD-10-CM | POA: Diagnosis not present

## 2016-12-24 DIAGNOSIS — Z23 Encounter for immunization: Secondary | ICD-10-CM | POA: Diagnosis not present

## 2016-12-24 DIAGNOSIS — I10 Essential (primary) hypertension: Secondary | ICD-10-CM | POA: Diagnosis not present

## 2016-12-25 DIAGNOSIS — M9903 Segmental and somatic dysfunction of lumbar region: Secondary | ICD-10-CM | POA: Diagnosis not present

## 2016-12-25 DIAGNOSIS — G603 Idiopathic progressive neuropathy: Secondary | ICD-10-CM | POA: Diagnosis not present

## 2016-12-25 DIAGNOSIS — M9905 Segmental and somatic dysfunction of pelvic region: Secondary | ICD-10-CM | POA: Diagnosis not present

## 2016-12-25 DIAGNOSIS — Q72812 Congenital shortening of left lower limb: Secondary | ICD-10-CM | POA: Diagnosis not present

## 2016-12-25 DIAGNOSIS — M5137 Other intervertebral disc degeneration, lumbosacral region: Secondary | ICD-10-CM | POA: Diagnosis not present

## 2016-12-25 DIAGNOSIS — M9904 Segmental and somatic dysfunction of sacral region: Secondary | ICD-10-CM | POA: Diagnosis not present

## 2016-12-29 NOTE — Telephone Encounter (Signed)
Close Encounter 

## 2017-01-13 ENCOUNTER — Ambulatory Visit (INDEPENDENT_AMBULATORY_CARE_PROVIDER_SITE_OTHER): Payer: PPO | Admitting: Pharmacist

## 2017-01-13 DIAGNOSIS — E7800 Pure hypercholesterolemia, unspecified: Secondary | ICD-10-CM | POA: Diagnosis not present

## 2017-01-13 MED ORDER — PITAVASTATIN CALCIUM 4 MG PO TABS: 4.0000 mg | ORAL_TABLET | Freq: Every day | ORAL | 11 refills | Status: DC

## 2017-01-13 NOTE — Patient Instructions (Addendum)
It was nice to see you today  Increase your Livalo to 4mg  daily  Call Samarrah Tranchina in the lipid clinic if you have problems tolerating it [phone]  Recheck cholesterol in 3 months on Thursday, January 3rd. Come in any time after 7:30am for fasting lab work

## 2017-01-13 NOTE — Progress Notes (Signed)
Patient ID: [name]                 DOB: [birth year]                    MRN: [id number]     HPI: [name] a very pleasant 71 y.o.malepatient of Dr. Martinique presenting to lipid clinic for follow up. PMH is significant forCAD with stents placed in2017, HTN, HLD, and seizure disorder. Pt has a history of intolerance to 4 statin medications over the past 11-12 years. Pt was previously started on Crestor 5mg  daily in November 2017. He tolerated this for some time and his LDL dropped from 141 to 76. However, after 5-6 months on Crestor, he developed joint aches in his hips and knees in addition to bilateral muscle aches in his thighs. At his last lipid visit, pt was tolerating Livalo 2mg  daily but was hesitant to increase his dose or start Zetia. He presents today for further discussion.  Pt presents today in good spirits. He has been tolerating his Livalo 2mg  well. He is still hesitant to make any medication changes - he is worried about not tolerating a higher dose of Livalo, and he prefers not to add another medication to his list of medications he is already taking. He is currently in the donut hole.  Current Medications: Livalo 2mg  daily   Intolerances: Lipitor 40 mg daily, Crestor 5mg  daily, simvastatin with elevated CK 388, Vytorin(all caused myalgias with improvement upon stopping), pravastatin (minimal LDL lowering)  Risk Factors: CAD with stents (12/2015), HTN, age  LDL goal: <70 mg/dL  Diet: Breakfast meats a few days a week   Exercise: Goes to Sport time 2-3x per week  Family History: HF (father), HLD (mother)  Social History: Former smoker of cigarettes. Quit on 07/03/1965. Denies using smokeless tobacco, endorses drinking 2 beers a month, denies illicit drug use.   Labs: 10/28/16: TC 179, TG 65, HDL 69, LDL 97 (Livalo 2mg  daily) 03/31/16: TC 163, TG 70, HDL 73, LDL 76 (Crestor 5mg  daily)  Past Medical History:  Diagnosis Date  . Cervical spine disease    History of cervical spine disease  . Coronary artery disease    s/p extensive stenting of the mid to distal LAD with 3 Cypher stents and past stenting of the mid RCA. He does have a diagonal branch that was jailed. He has stable angina. Negative myoview in October of 2012; Managed medically.   . Hypercholesterolemia   . Hypertension   . Knee pain   . Seizure disorder ([place])   . Sleep apnea    use to wear cpap  . Vagal reaction     Current Outpatient Prescriptions on File Prior to Visit  Medication Sig Dispense Refill  . amLODipine (NORVASC) 5 MG tablet take 1 tablet by mouth twice a day 180 tablet 1  . aspirin 81 MG tablet Take 81 mg by mouth daily.      Marland Kitchen BYSTOLIC 10 MG tablet take 1 tablet by mouth once daily 90 tablet 3  . carbamazepine (TEGRETOL) 200 MG tablet take 1 tablet by mouth four times a day 360 tablet 3  . clopidogrel (PLAVIX) 75 MG tablet take 1 tablet by mouth once daily 90 tablet 1  . olmesartan (BENICAR) 40 MG tablet take 1 tablet by mouth once daily 90 tablet 3  . Pitavastatin Calcium (LIVALO) 2 MG TABS Take 1 tablet (2 mg total) by mouth daily. 30 tablet 11  .  valACYclovir (VALTREX) 500 MG tablet Take 250 mg by mouth every other day.      No current facility-administered medications on file prior to visit.     No Known Allergies  Assessment/Plan:  1. Hyperlipidemia - LDL currently 97mg /dL on Livalo 2mg  daily above goal 70mg /dL given hx of ASCVD. Pt is agreeable to increasing dose of Livalo to 4mg  daily rather than starting Zetia. Advised pt to call clinic if he experiences any adverse effects. Will recheck lipids in 2-3 months prior to OV with Dr Martinique.   [name], PharmD, CPP, [facility] [address] [place], [place] [address] Phone: [phone]; Fax: [phone] 01/13/2017 9:32 AM

## 2017-02-12 DIAGNOSIS — Z6828 Body mass index (BMI) 28.0-28.9, adult: Secondary | ICD-10-CM | POA: Diagnosis not present

## 2017-02-12 DIAGNOSIS — I10 Essential (primary) hypertension: Secondary | ICD-10-CM | POA: Diagnosis not present

## 2017-03-19 ENCOUNTER — Other Ambulatory Visit: Payer: PPO | Admitting: *Deleted

## 2017-03-19 ENCOUNTER — Other Ambulatory Visit: Payer: Self-pay | Admitting: Cardiology

## 2017-03-19 DIAGNOSIS — E7800 Pure hypercholesterolemia, unspecified: Secondary | ICD-10-CM | POA: Diagnosis not present

## 2017-03-19 LAB — LIPID PANEL
CHOLESTEROL TOTAL: 197 mg/dL (ref 100–199)
Chol/HDL Ratio: 2.6 ratio (ref 0.0–5.0)
HDL: 77 mg/dL (ref >39)
LDL Calculated: 107 mg/dL — ABNORMAL HIGH (ref 0–99)
Triglycerides: 67 mg/dL (ref 0–149)
VLDL Cholesterol Cal: 13 mg/dL (ref 5–40)

## 2017-03-19 LAB — HEPATIC FUNCTION PANEL
ALBUMIN: 4.3 g/dL (ref 3.5–4.8)
ALT: 21 IU/L (ref 0–44)
AST: 19 IU/L (ref 0–40)
Alkaline Phosphatase: 80 IU/L (ref 39–117)
BILIRUBIN, DIRECT: 0.09 mg/dL (ref 0.00–0.40)
Bilirubin Total: 0.3 mg/dL (ref 0.0–1.2)
TOTAL PROTEIN: 6.7 g/dL (ref 6.0–8.5)

## 2017-03-20 ENCOUNTER — Other Ambulatory Visit: Payer: Self-pay

## 2017-03-20 DIAGNOSIS — I2510 Atherosclerotic heart disease of native coronary artery without angina pectoris: Secondary | ICD-10-CM

## 2017-03-20 MED ORDER — AMLODIPINE BESYLATE 5 MG PO TABS: 5.0000 mg | ORAL_TABLET | Freq: Two times a day (BID) | ORAL | 3 refills | Status: DC

## 2017-03-20 MED ORDER — NEBIVOLOL HCL 10 MG PO TABS: 10.0000 mg | ORAL_TABLET | Freq: Every day | ORAL | 3 refills | Status: DC

## 2017-03-20 MED ORDER — CLOPIDOGREL BISULFATE 75 MG PO TABS: 75.0000 mg | ORAL_TABLET | Freq: Every day | ORAL | 3 refills | Status: DC

## 2017-03-20 MED ORDER — PITAVASTATIN CALCIUM 4 MG PO TABS: 4.0000 mg | ORAL_TABLET | Freq: Every day | ORAL | 3 refills | Status: DC

## 2017-03-20 MED ORDER — OLMESARTAN MEDOXOMIL 40 MG PO TABS: 40.0000 mg | ORAL_TABLET | Freq: Every day | ORAL | 3 refills | Status: DC

## 2017-03-21 NOTE — Progress Notes (Signed)
[name] Date of Birth: [birth year] Medical Record #[id number]  History of Present Illness: [name] is seen back today for follow up CAD.  He is status post extensive stenting of the LAD with Cypher stents in the past. He had stenting of the right coronary as well. He has a diagonal branch that was jailed by the stents. Followup stress testing has been normal last in 2012 and stress Myoview in Dec. 2015 was normal. LE arterial dopplers were normal in October 2017.  He has been followed in our lipid clinic. He has a history of intolerance to numerous statins. Was tried on Crestor 5 mg daily and did well on this for 5-6 months but then developed myalgias. LDL decreased to 76. He was  switched to Livalo 2 mg daily and he has tolerated  it well. Later increased to 4 mg daily and LDL actually increased from 97 to 107. Diet wasn't as good over the holidays- eating a lot of sweets. Admits he has not been active over the past 2 months. BP has been controlled. HCTZ stopped due to hyponatremia. He does note some slight swelling in his feet at times.    Current Outpatient Medications on File Prior to Visit  Medication Sig Dispense Refill  . amLODipine (NORVASC) 5 MG tablet Take 1 tablet (5 mg total) by mouth 2 (two) times daily. 180 tablet 3  . aspirin 81 MG tablet Take 81 mg by mouth daily.      . carbamazepine (TEGRETOL) 200 MG tablet take 1 tablet by mouth four times a day 360 tablet 3  . clopidogrel (PLAVIX) 75 MG tablet Take 1 tablet (75 mg total) by mouth daily. 90 tablet 3  . hydrALAZINE (APRESOLINE) 50 MG tablet Take 1 tablet by mouth 2 (two) times daily.  0  . nebivolol (BYSTOLIC) 10 MG tablet Take 1 tablet (10 mg total) by mouth daily. 90 tablet 3  . olmesartan (BENICAR) 40 MG tablet Take 1 tablet (40 mg total) by mouth daily. 90 tablet 3  . valACYclovir (VALTREX) 500 MG tablet Take 250 mg by mouth every other day.      No current facility-administered medications on file prior to visit.      No Known Allergies  Past Medical History:  Diagnosis Date  . Cervical spine disease    History of cervical spine disease  . Coronary artery disease    s/p extensive stenting of the mid to distal LAD with 3 Cypher stents and past stenting of the mid RCA. He does have a diagonal branch that was jailed. He has stable angina. Negative myoview in October of 2012; Managed medically.   . Hypercholesterolemia   . Hypertension   . Knee pain   . Seizure disorder ([place])   . Sleep apnea    use to wear cpap  . Vagal reaction     Past Surgical History:  Procedure Laterality Date  . COLONOSCOPY    . coronary artery stent placement    . HERNIA REPAIR     right groin  . SHOULDER ARTHROSCOPY     right  . VASECTOMY      Social History   Tobacco Use  Smoking Status Former Smoker  . Last attempt to quit: 07/03/1965  . Years since quitting: 51.7  Smokeless Tobacco Never Used    Social History   Substance and Sexual Activity  Alcohol Use Yes  . Alcohol/week: 0.0 oz   Comment: 2 beers/month    Family History  Problem Relation Age of Onset  . Heart failure Father 51  . Hyperlipidemia Mother   . Colon cancer Neg Hx     Review of Systems: The review of systems is per the HPI.  . All other systems were reviewed and are negative.  Physical Exam: BP (!) 144/56   Pulse 69   Ht 6\' 1"  (1.854 m)   Wt 207 lb 12.8 oz (94.3 kg)   BMI 27.42 kg/m  GENERAL:  Well appearing HEENT:  PERRL, EOMI, sclera are clear. Oropharynx is clear. NECK:  No jugular venous distention, carotid upstroke brisk and symmetric, no bruits, no thyromegaly or adenopathy LUNGS:  Clear to auscultation bilaterally CHEST:  Unremarkable HEART:  RRR,  PMI not displaced or sustained,S1 and S2 within normal limits, no S3, no S4: no clicks, no rubs, no murmurs ABD:  Soft, nontender. BS +, no masses or bruits. No hepatomegaly, no splenomegaly EXT:  2 + pulses throughout, no edema, no cyanosis no clubbing SKIN:  Warm  and dry.  No rashes NEURO:  Alert and oriented x 3. Cranial nerves II through XII intact. PSYCH:  Cognitively intact     LABORATORY DATA: Lab Results  Component Value Date   WBC 6.1 10/09/2015   HGB 13.3 10/09/2015   HCT 38.3 10/09/2015   PLT 210 10/09/2015   GLUCOSE 105 (H) 10/09/2015   CHOL 197 03/19/2017   TRIG 67 03/19/2017   HDL 77 03/19/2017   LDLCALC 107 (H) 03/19/2017   ALT 21 03/19/2017   AST 19 03/19/2017   NA 133 (L) 10/09/2015   K 5.3 (H) 10/09/2015   CL 94 (L) 10/09/2015   CREATININE 1.24 10/09/2015   BUN 27 10/09/2015   CO2 23 10/09/2015   INR 1.2 08/16/2007   Ecg today shows NSR rate 69. LAFB, LVH. No change from prior. I have personally reviewed and interpreted this study.  Assessment / Plan: 1. Coronary disease with prior extensive stenting of the LAD and right coronaries with first generation Cypher stents. Anginal symptoms are stable class 1 on medical therapy.  Myoview in Dec. 2015  showed no evidence of ischemia. He is not inclined to repeat stress testing since he states he felt like he was going to die with Lexiscan. Continue DAPT with ASA and Plavix long term.   2. Hypertension. Blood pressure is OK.  If additional therapy needed would consider Cardura but would start low and watch for orthostasis.   3. Hypercholesterolemia.  followed in lipid clinic. On Livalo now 4 mg daily. Discussed importance of lifestyle modification with good diet and regular exercise. He is going to do better and we will repeat labs in 3 months. If no better will add Zetia.   4. Chronotropic incompetence. Noted on prior stress test and beta blocker was reduced.  HR improved.  I will follow up in 6 months.

## 2017-03-23 ENCOUNTER — Telehealth: Payer: Self-pay | Admitting: Pharmacist

## 2017-03-23 ENCOUNTER — Encounter: Payer: Self-pay | Admitting: Cardiology

## 2017-03-23 ENCOUNTER — Ambulatory Visit: Payer: PPO | Admitting: Cardiology

## 2017-03-23 VITALS — BP 144/56 | HR 69 | Ht 73.0 in | Wt 207.8 lb

## 2017-03-23 DIAGNOSIS — I10 Essential (primary) hypertension: Secondary | ICD-10-CM | POA: Diagnosis not present

## 2017-03-23 DIAGNOSIS — E7800 Pure hypercholesterolemia, unspecified: Secondary | ICD-10-CM

## 2017-03-23 DIAGNOSIS — I2510 Atherosclerotic heart disease of native coronary artery without angina pectoris: Secondary | ICD-10-CM | POA: Diagnosis not present

## 2017-03-23 MED ORDER — PITAVASTATIN CALCIUM 4 MG PO TABS: 4.0000 mg | ORAL_TABLET | Freq: Every day | ORAL | 3 refills | Status: DC

## 2017-03-23 NOTE — Patient Instructions (Signed)
You need to get back to a healthy diet- cutting out the sweets and salt.  We will recheck your lipids in 3 months  Continue your other therapy  I will see you in 6 months.

## 2017-03-23 NOTE — Addendum Note (Signed)
Addended by: Kathyrn Lass on: 03/23/2017 10:05 AM   Modules accepted: Orders

## 2017-03-23 NOTE — Telephone Encounter (Signed)
[name] called to update [name] on cholesterol management after visit with Dr. Martinique today. He reports that he forgot to mention to [facility] that has not been doing well with exercise. He also reports that his diet has been poor recently. After discussion will repeat lipid panel in 3 months with strict diet and exercise to determine if need addition of Zetia as previously discussed with [name]. Will route to her as FYI. He will call with any other questions or concerns.

## 2017-06-08 DIAGNOSIS — I10 Essential (primary) hypertension: Secondary | ICD-10-CM | POA: Diagnosis not present

## 2017-06-08 DIAGNOSIS — E7849 Other hyperlipidemia: Secondary | ICD-10-CM | POA: Diagnosis not present

## 2017-06-08 DIAGNOSIS — R569 Unspecified convulsions: Secondary | ICD-10-CM | POA: Diagnosis not present

## 2017-06-08 DIAGNOSIS — R82998 Other abnormal findings in urine: Secondary | ICD-10-CM | POA: Diagnosis not present

## 2017-06-08 DIAGNOSIS — Z125 Encounter for screening for malignant neoplasm of prostate: Secondary | ICD-10-CM | POA: Diagnosis not present

## 2017-06-12 DIAGNOSIS — Z1212 Encounter for screening for malignant neoplasm of rectum: Secondary | ICD-10-CM | POA: Diagnosis not present

## 2017-06-15 DIAGNOSIS — R600 Localized edema: Secondary | ICD-10-CM | POA: Diagnosis not present

## 2017-06-15 DIAGNOSIS — E7849 Other hyperlipidemia: Secondary | ICD-10-CM | POA: Diagnosis not present

## 2017-06-15 DIAGNOSIS — I10 Essential (primary) hypertension: Secondary | ICD-10-CM | POA: Diagnosis not present

## 2017-06-15 DIAGNOSIS — G629 Polyneuropathy, unspecified: Secondary | ICD-10-CM | POA: Diagnosis not present

## 2017-06-15 DIAGNOSIS — Z6828 Body mass index (BMI) 28.0-28.9, adult: Secondary | ICD-10-CM | POA: Diagnosis not present

## 2017-06-15 DIAGNOSIS — Z1389 Encounter for screening for other disorder: Secondary | ICD-10-CM | POA: Diagnosis not present

## 2017-06-15 DIAGNOSIS — Z0000 Encounter for general adult medical examination without abnormal findings: Secondary | ICD-10-CM | POA: Diagnosis not present

## 2017-06-15 DIAGNOSIS — I2510 Atherosclerotic heart disease of native coronary artery without angina pectoris: Secondary | ICD-10-CM | POA: Diagnosis not present

## 2017-06-26 DIAGNOSIS — J111 Influenza due to unidentified influenza virus with other respiratory manifestations: Secondary | ICD-10-CM | POA: Diagnosis not present

## 2017-06-26 DIAGNOSIS — Z6827 Body mass index (BMI) 27.0-27.9, adult: Secondary | ICD-10-CM | POA: Diagnosis not present

## 2017-06-26 DIAGNOSIS — J302 Other seasonal allergic rhinitis: Secondary | ICD-10-CM | POA: Diagnosis not present

## 2017-06-26 DIAGNOSIS — R05 Cough: Secondary | ICD-10-CM | POA: Diagnosis not present

## 2017-06-26 DIAGNOSIS — J019 Acute sinusitis, unspecified: Secondary | ICD-10-CM | POA: Diagnosis not present

## 2017-06-30 DIAGNOSIS — L812 Freckles: Secondary | ICD-10-CM | POA: Diagnosis not present

## 2017-06-30 DIAGNOSIS — D692 Other nonthrombocytopenic purpura: Secondary | ICD-10-CM | POA: Diagnosis not present

## 2017-06-30 DIAGNOSIS — L821 Other seborrheic keratosis: Secondary | ICD-10-CM | POA: Diagnosis not present

## 2017-06-30 DIAGNOSIS — Z85828 Personal history of other malignant neoplasm of skin: Secondary | ICD-10-CM | POA: Diagnosis not present

## 2017-06-30 DIAGNOSIS — D1801 Hemangioma of skin and subcutaneous tissue: Secondary | ICD-10-CM | POA: Diagnosis not present

## 2017-06-30 DIAGNOSIS — L570 Actinic keratosis: Secondary | ICD-10-CM | POA: Diagnosis not present

## 2017-09-02 DIAGNOSIS — G6289 Other specified polyneuropathies: Secondary | ICD-10-CM | POA: Diagnosis not present

## 2017-09-02 DIAGNOSIS — L84 Corns and callosities: Secondary | ICD-10-CM | POA: Diagnosis not present

## 2017-09-02 DIAGNOSIS — Z6827 Body mass index (BMI) 27.0-27.9, adult: Secondary | ICD-10-CM | POA: Diagnosis not present

## 2017-09-02 DIAGNOSIS — I10 Essential (primary) hypertension: Secondary | ICD-10-CM | POA: Diagnosis not present

## 2017-09-23 NOTE — Progress Notes (Signed)
[name] Date of Birth: 1945/06/21 Medical Record #[id number]  History of Present Illness: [name] is seen back today for follow up CAD.  He is status post extensive stenting of the LAD with Cypher stents in the past. He had stenting of the right coronary as well. He has a diagonal branch that was jailed by the stents. Followup stress testing has been normal last in 2012 and stress Myoview in Dec. 2015 was normal. LE arterial dopplers were normal in October 2017.  He has been followed in our lipid clinic. He has a history of intolerance to numerous statins. Was tried on Crestor 5 mg daily and did well on this for 5-6 months but then developed myalgias. LDL decreased to 76. He was  switched to Livalo 2 mg daily and he has tolerated  it well. Later increased to 4 mg daily. He has been doing better with his diet and has lost 6 lbs. Denies any chest pain or SOB. Wears CPAP intermittently- states it is uncomfortable in the summer and dries out his throat. He complains of neuropathy in his feet. He goes to the gym 2-3 x /wk and does a lot of yard work.    Current Outpatient Medications on File Prior to Visit  Medication Sig Dispense Refill  . amLODipine (NORVASC) 5 MG tablet Take 1 tablet (5 mg total) by mouth 2 (two) times daily. 180 tablet 3  . aspirin 81 MG tablet Take 81 mg by mouth daily.      . carbamazepine (TEGRETOL) 200 MG tablet take 1 tablet by mouth four times a day 360 tablet 3  . clopidogrel (PLAVIX) 75 MG tablet Take 1 tablet (75 mg total) by mouth daily. 90 tablet 3  . hydrALAZINE (APRESOLINE) 50 MG tablet Take 1 tablet by mouth 2 (two) times daily.  0  . nebivolol (BYSTOLIC) 10 MG tablet Take 1 tablet (10 mg total) by mouth daily. 90 tablet 3  . olmesartan (BENICAR) 40 MG tablet Take 1 tablet (40 mg total) by mouth daily. 90 tablet 3  . Pitavastatin Calcium (LIVALO) 4 MG TABS Take 1 tablet (4 mg total) by mouth daily. 90 tablet 3  . valACYclovir (VALTREX) 500 MG tablet Take 250 mg  by mouth every other day.      No current facility-administered medications on file prior to visit.     No Known Allergies  Past Medical History:  Diagnosis Date  . Cervical spine disease    History of cervical spine disease  . Coronary artery disease    s/p extensive stenting of the mid to distal LAD with 3 Cypher stents and past stenting of the mid RCA. He does have a diagonal branch that was jailed. He has stable angina. Negative myoview in October of 2012; Managed medically.   . Hypercholesterolemia   . Hypertension   . Knee pain   . Seizure disorder ([place])   . Sleep apnea    use to wear cpap  . Vagal reaction     Past Surgical History:  Procedure Laterality Date  . COLONOSCOPY    . coronary artery stent placement    . HERNIA REPAIR     right groin  . SHOULDER ARTHROSCOPY     right  . VASECTOMY      Social History   Tobacco Use  Smoking Status Former Smoker  . Last attempt to quit: 07/03/1965  . Years since quitting: 52.2  Smokeless Tobacco Never Used    Social History  Substance and Sexual Activity  Alcohol Use Yes  . Alcohol/week: 0.0 oz   Comment: 2 beers/month    Family History  Problem Relation Age of Onset  . Heart failure Father 56  . Hyperlipidemia Mother   . Colon cancer Neg Hx     Review of Systems: The review of systems is per the HPI.  . All other systems were reviewed and are negative.  Physical Exam: BP 125/67   Pulse (!) 54   Ht 6\' 1"  (1.854 m)   Wt 201 lb 9.6 oz (91.4 kg)   BMI 26.60 kg/m  GENERAL:  Well appearing WM in NAD HEENT:  PERRL, EOMI, sclera are clear. Oropharynx is clear. NECK:  No jugular venous distention, carotid upstroke brisk and symmetric, no bruits, no thyromegaly or adenopathy LUNGS:  Clear to auscultation bilaterally CHEST:  Unremarkable HEART:  RRR,  PMI not displaced or sustained,S1 and S2 within normal limits, no S3, no S4: no clicks, no rubs, no murmurs ABD:  Soft, nontender. BS +, no masses or bruits.  No hepatomegaly, no splenomegaly EXT:  2 + pulses throughout, no edema, no cyanosis no clubbing SKIN:  Warm and dry.  No rashes NEURO:  Alert and oriented x 3. Cranial nerves II through XII intact. PSYCH:  Cognitively intact       LABORATORY DATA: Lab Results  Component Value Date   WBC 6.1 10/09/2015   HGB 13.3 10/09/2015   HCT 38.3 10/09/2015   PLT 210 10/09/2015   GLUCOSE 105 (H) 10/09/2015   CHOL 197 03/19/2017   TRIG 67 03/19/2017   HDL 77 03/19/2017   LDLCALC 107 (H) 03/19/2017   ALT 21 03/19/2017   AST 19 03/19/2017   NA 133 (L) 10/09/2015   K 5.3 (H) 10/09/2015   CL 94 (L) 10/09/2015   CREATININE 1.24 10/09/2015   BUN 27 10/09/2015   CO2 23 10/09/2015   INR 1.2 08/16/2007    Dated 06/08/17: cholesterol 181, triglycerides 102, HDL 62, LDL 99. Creatinine 1.3. Otherwise chemistries, CBC, TSH normal  Assessment / Plan: 1. Coronary disease with prior extensive stenting of the LAD and right coronaries with first generation Cypher stents. Anginal symptoms are stable class 1 on medical therapy.  Myoview in Dec. 2015  showed no evidence of ischemia. He is not inclined to repeat stress testing since he states he felt like he was going to die with Lexiscan. Continue DAPT with ASA and Plavix long term.   2. Hypertension. Blood pressure is well controlled.   3. Hypercholesterolemia.  On Livalo now 4 mg daily. Discussed importance of lifestyle modification with good diet and regular exercise. LDL 99. Current guidelines would suggest adding Zetia but he is not inclined to add additional therapy at this time.  4. Chronotropic incompetence. Noted on prior stress test and beta blocker was reduced.  HR improved.  I will follow up in 6 months.

## 2017-09-24 ENCOUNTER — Ambulatory Visit: Payer: PPO | Admitting: Cardiology

## 2017-09-24 ENCOUNTER — Encounter: Payer: Self-pay | Admitting: Cardiology

## 2017-09-24 VITALS — BP 125/67 | HR 54 | Ht 73.0 in | Wt 201.6 lb

## 2017-09-24 DIAGNOSIS — I10 Essential (primary) hypertension: Secondary | ICD-10-CM | POA: Diagnosis not present

## 2017-09-24 DIAGNOSIS — E7800 Pure hypercholesterolemia, unspecified: Secondary | ICD-10-CM | POA: Diagnosis not present

## 2017-09-24 DIAGNOSIS — I2510 Atherosclerotic heart disease of native coronary artery without angina pectoris: Secondary | ICD-10-CM | POA: Diagnosis not present

## 2017-09-24 NOTE — Patient Instructions (Signed)
Continue your current therapy  I will see you  In 6 months

## 2017-09-30 ENCOUNTER — Telehealth: Payer: Self-pay | Admitting: Neurology

## 2017-09-30 NOTE — Telephone Encounter (Signed)
Called pt. He has some balance problems in the last few months but he is unsure if it is related to Tegretol levels. He states he is not sure if its just because of getting older or not. I relayed Dr. Gladstone Lighter recommendations. I offered for Dr. Leta Baptist to place orders for him to come in prior to his appt on 10/13/17. He stated "forget it. We will just wait until my appt with Dr. Jannifer Franklin. I just thought we would be able to talk a little more intelligently if he knew the level prior to my appt". I offered a couple more times to put order in to have his level checked prior to his appt but he declined saying he will wait till his follow up. I advised him to call back if he changes his mind. He verbalized understanding.

## 2017-09-30 NOTE — Telephone Encounter (Signed)
If patient has been having more symptoms (dizzy, balance off, foggy) then he may come in anytime for carbamazepine trough level in the morning. If he has been doing well, then I would recommend he come in for the visit with dr Jannifer Franklin first. -VRP

## 2017-09-30 NOTE — Telephone Encounter (Addendum)
Pt has called to inform that about 4 months ago he had his TEGRETOL LEVELS checked and was told that he was just on the outside of the high levels.  Pt is asking if he should come in for labs before his 07-30 appointment so Dr Jannifer Franklin could have results to look over on or before pt's appointment.  Pt is asking for a call back to discuss when he can come in for blood work for the checking of his tegretol levels

## 2017-10-13 ENCOUNTER — Encounter: Payer: Self-pay | Admitting: Neurology

## 2017-10-13 ENCOUNTER — Other Ambulatory Visit: Payer: Self-pay

## 2017-10-13 ENCOUNTER — Ambulatory Visit: Payer: PPO | Admitting: Neurology

## 2017-10-13 VITALS — BP 139/57 | HR 51 | Ht 73.0 in | Wt 207.0 lb

## 2017-10-13 DIAGNOSIS — G40909 Epilepsy, unspecified, not intractable, without status epilepticus: Secondary | ICD-10-CM | POA: Diagnosis not present

## 2017-10-13 MED ORDER — CARBAMAZEPINE 200 MG PO TABS: ORAL_TABLET | ORAL | 3 refills | Status: DC

## 2017-10-13 NOTE — Patient Instructions (Signed)
Reduce the cabamazepine dosing to 3.5 tablets daily

## 2017-10-13 NOTE — Progress Notes (Signed)
Reason for visit: Seizures  [name] is an 72 y.o. male  History of present illness:  [name] is a 72 year old right-handed white male with a history of seizures that have been well controlled on carbamazepine.  The patient has not had a seizure since last seen, he has noted some mild gait instability over the last several months.  A carbamazepine level in April 2019 was 13.0.  The patient remains on 200 mg of carbamazepine 4 times daily.  He does have some numbness in the feet up to the ankles, he was told in 2003 that he had a peripheral neuropathy that has gradually worsened over time, he does not have a lot of pain associated with this.  He has never had nerve conduction studies to document the neuropathy.  Past Medical History:  Diagnosis Date  . Cervical spine disease    History of cervical spine disease  . Coronary artery disease    s/p extensive stenting of the mid to distal LAD with 3 Cypher stents and past stenting of the mid RCA. He does have a diagonal branch that was jailed. He has stable angina. Negative myoview in October of 2012; Managed medically.   . Hypercholesterolemia   . Hypertension   . Knee pain   . Seizure disorder ([place])   . Sleep apnea    use to wear cpap  . Vagal reaction     Past Surgical History:  Procedure Laterality Date  . COLONOSCOPY    . coronary artery stent placement    . HERNIA REPAIR     right groin  . SHOULDER ARTHROSCOPY     right  . VASECTOMY      Family History  Problem Relation Age of Onset  . Heart failure Father 14  . Hyperlipidemia Mother   . Colon cancer Neg Hx     Social history:  reports that he quit smoking about 52 years ago. He has never used smokeless tobacco. He reports that he drinks alcohol. He reports that he does not use drugs.   No Known Allergies  Medications:  Prior to Admission medications   Medication Sig Start Date End Date Taking? Authorizing Provider  amLODipine (NORVASC) 5 MG tablet Take 1  tablet (5 mg total) by mouth 2 (two) times daily. 03/20/17  Yes Martinique, Peter M, MD  aspirin 81 MG tablet Take 81 mg by mouth daily.     Yes [provider]  carbamazepine (TEGRETOL) 200 MG tablet One tablet three times a day, 1/2 tablet in evening 10/13/17  Yes Kathrynn Ducking, MD  clopidogrel (PLAVIX) 75 MG tablet Take 1 tablet (75 mg total) by mouth daily. 03/20/17  Yes Martinique, Peter M, MD  hydrALAZINE (APRESOLINE) 50 MG tablet Take 1 tablet by mouth 2 (two) times daily. 03/19/17  Yes [provider]  nebivolol (BYSTOLIC) 10 MG tablet Take 1 tablet (10 mg total) by mouth daily. 03/20/17  Yes Martinique, Peter M, MD  olmesartan (BENICAR) 40 MG tablet Take 1 tablet (40 mg total) by mouth daily. 03/20/17  Yes Martinique, Peter M, MD  Pitavastatin Calcium (LIVALO) 4 MG TABS Take 1 tablet (4 mg total) by mouth daily. 03/23/17  Yes Martinique, Peter M, MD  valACYclovir (VALTREX) 500 MG tablet Take 250 mg by mouth every other day.    Yes [provider]    ROS:  Out of a complete 14 system review of symptoms, the patient complains only of the following symptoms, and all  other reviewed systems are negative.  Seizures  Blood pressure (!) 139/57, pulse (!) 51, height 6\' 1"  (1.854 m), weight 207 lb (93.9 kg).  Physical Exam  General: The patient is alert and cooperative at the time of the examination.  Skin: No significant peripheral edema is noted.   Neurologic Exam  Mental status: The patient is alert and oriented x 3 at the time of the examination. The patient has apparent normal recent and remote memory, with an apparently normal attention span and concentration ability.   Cranial nerves: Facial symmetry is present. Speech is normal, no aphasia or dysarthria is noted. Extraocular movements are full. Visual fields are full.  Motor: The patient has good strength in all 4 extremities.  Sensory examination: Soft touch sensation is symmetric on the face, arms, and legs.  Coordination:  The patient has good finger-nose-finger and heel-to-shin bilaterally.  Gait and station: The patient has a normal gait. Tandem gait is normal. Romberg is negative. No drift is seen.  Reflexes: Deep tendon reflexes are symmetric.   Assessment/Plan:  1.  History of seizures, well controlled  2.  Clinical history of peripheral neuropathy  The patient will reduce the carbamazepine dose taking 3.5 tablets daily, we will recheck the blood level of carbamazepine in about 4 weeks.  The patient will follow-up otherwise in 1 year.  Jill Alexanders MD 10/13/2017 9:52 AM  [facility] [address] [place], [place] [address]  Phone [phone] Fax [phone]

## 2017-11-10 ENCOUNTER — Telehealth: Payer: Self-pay | Admitting: Neurology

## 2017-11-10 DIAGNOSIS — Z5181 Encounter for therapeutic drug level monitoring: Secondary | ICD-10-CM

## 2017-11-10 NOTE — Telephone Encounter (Signed)
I called the patient.  Patient is to come in and get his carbamazepine level rechecked.

## 2017-11-11 ENCOUNTER — Other Ambulatory Visit (INDEPENDENT_AMBULATORY_CARE_PROVIDER_SITE_OTHER): Payer: Self-pay

## 2017-11-11 DIAGNOSIS — Z5181 Encounter for therapeutic drug level monitoring: Secondary | ICD-10-CM | POA: Diagnosis not present

## 2017-11-11 DIAGNOSIS — Z0289 Encounter for other administrative examinations: Secondary | ICD-10-CM

## 2017-11-12 LAB — CARBAMAZEPINE LEVEL, TOTAL: Carbamazepine (Tegretol), S: 10.6 ug/mL (ref 4.0–12.0)

## 2017-11-19 ENCOUNTER — Other Ambulatory Visit: Payer: Self-pay | Admitting: Neurology

## 2017-12-07 DIAGNOSIS — S93402A Sprain of unspecified ligament of left ankle, initial encounter: Secondary | ICD-10-CM | POA: Diagnosis not present

## 2017-12-07 DIAGNOSIS — I10 Essential (primary) hypertension: Secondary | ICD-10-CM | POA: Diagnosis not present

## 2017-12-12 DIAGNOSIS — Z23 Encounter for immunization: Secondary | ICD-10-CM | POA: Diagnosis not present

## 2017-12-14 DIAGNOSIS — I739 Peripheral vascular disease, unspecified: Secondary | ICD-10-CM | POA: Diagnosis not present

## 2017-12-14 DIAGNOSIS — G4733 Obstructive sleep apnea (adult) (pediatric): Secondary | ICD-10-CM | POA: Diagnosis not present

## 2017-12-14 DIAGNOSIS — I2510 Atherosclerotic heart disease of native coronary artery without angina pectoris: Secondary | ICD-10-CM | POA: Diagnosis not present

## 2017-12-14 DIAGNOSIS — R600 Localized edema: Secondary | ICD-10-CM | POA: Diagnosis not present

## 2017-12-14 DIAGNOSIS — M545 Low back pain: Secondary | ICD-10-CM | POA: Diagnosis not present

## 2017-12-14 DIAGNOSIS — G629 Polyneuropathy, unspecified: Secondary | ICD-10-CM | POA: Diagnosis not present

## 2017-12-14 DIAGNOSIS — G500 Trigeminal neuralgia: Secondary | ICD-10-CM | POA: Diagnosis not present

## 2017-12-14 DIAGNOSIS — E7849 Other hyperlipidemia: Secondary | ICD-10-CM | POA: Diagnosis not present

## 2017-12-14 DIAGNOSIS — S93402A Sprain of unspecified ligament of left ankle, initial encounter: Secondary | ICD-10-CM | POA: Diagnosis not present

## 2017-12-14 DIAGNOSIS — R569 Unspecified convulsions: Secondary | ICD-10-CM | POA: Diagnosis not present

## 2017-12-14 DIAGNOSIS — I10 Essential (primary) hypertension: Secondary | ICD-10-CM | POA: Diagnosis not present

## 2017-12-28 ENCOUNTER — Telehealth: Payer: Self-pay | Admitting: Cardiology

## 2017-12-28 NOTE — Telephone Encounter (Signed)
Returned call to patient he stated he has been having lower back pain for the past 3 weeks.Stated he did a lot of yard work.He wanted to know if ok to take aleve for the next few days to see if it would help.Dr.Jordan advised ok to take if needed for the next couple of days.Advised to see PCP if not better.

## 2017-12-28 NOTE — Telephone Encounter (Signed)
New Message:   Please call, he wants to know what he can and what he can not take for pain please.

## 2017-12-31 DIAGNOSIS — L821 Other seborrheic keratosis: Secondary | ICD-10-CM | POA: Diagnosis not present

## 2017-12-31 DIAGNOSIS — L570 Actinic keratosis: Secondary | ICD-10-CM | POA: Diagnosis not present

## 2017-12-31 DIAGNOSIS — Z85828 Personal history of other malignant neoplasm of skin: Secondary | ICD-10-CM | POA: Diagnosis not present

## 2017-12-31 DIAGNOSIS — D692 Other nonthrombocytopenic purpura: Secondary | ICD-10-CM | POA: Diagnosis not present

## 2017-12-31 DIAGNOSIS — L723 Sebaceous cyst: Secondary | ICD-10-CM | POA: Diagnosis not present

## 2017-12-31 DIAGNOSIS — L820 Inflamed seborrheic keratosis: Secondary | ICD-10-CM | POA: Diagnosis not present

## 2017-12-31 DIAGNOSIS — D1801 Hemangioma of skin and subcutaneous tissue: Secondary | ICD-10-CM | POA: Diagnosis not present

## 2018-01-11 ENCOUNTER — Telehealth: Payer: Self-pay | Admitting: Cardiology

## 2018-01-11 NOTE — Telephone Encounter (Signed)
Returned call to patient he stated for the past 2 weeks he has been having pain in both thighs.Stated he stopped taking Livalo 2 days ago and pain in thighs seem alittle better.Stated he saw [name] Supple in lipid clinic.Stated he would like her advice and would like to schedule appointment to see her soon.Message sent to [facility].

## 2018-01-11 NOTE — Telephone Encounter (Signed)
New Message  Pt c/o medication issue:  1. Name of Medication: Pitavastatin Calcium (LIVALO) 4 MG TABS  2. How are you currently taking this medication (dosage and times per day)? Take 1 tablet (4 mg total) by mouth daily  3. Are you having a reaction (difficulty breathing--STAT)? no  4. What is your medication issue? Pt is requesting a call back about medication statin for cholestrerol

## 2018-01-12 NOTE — Telephone Encounter (Signed)
Recommend decreasing Livalo back to 2mg  daily as pt previously tolerated this dose, and starting Zetia 10mg  daily as previously recommended to pt multiple times over the past year.  Returned call to pt. He reports symptoms have improved a bit in the past 3 days off of Livalo. He will restart lower dose of Livalo 2mg  (will cut 4mg  tablet in half until he runs out) in a few days and call back with tolerability in about 1 month. Will plan to send in Zetia rx at that time

## 2018-01-19 ENCOUNTER — Telehealth: Payer: Self-pay | Admitting: Pharmacist

## 2018-01-19 DIAGNOSIS — E7800 Pure hypercholesterolemia, unspecified: Secondary | ICD-10-CM

## 2018-01-19 MED ORDER — EZETIMIBE 10 MG PO TABS: 10.0000 mg | ORAL_TABLET | Freq: Every day | ORAL | 11 refills | Status: DC

## 2018-01-19 NOTE — Telephone Encounter (Signed)
Pt called clinic to state he is going to start Livalo 2mg  daily. He will start Zetia 10mg  daily 1 month from now to ensure he tolerates lower dose of Livalo well. Scheduled f/u lipid panel in February to assess efficacy.

## 2018-03-18 ENCOUNTER — Other Ambulatory Visit: Payer: Self-pay | Admitting: Pharmacist

## 2018-03-18 MED ORDER — PITAVASTATIN CALCIUM 2 MG PO TABS: 2.0000 mg | ORAL_TABLET | Freq: Every day | ORAL | 3 refills | Status: DC

## 2018-03-23 ENCOUNTER — Other Ambulatory Visit: Payer: Self-pay | Admitting: Cardiology

## 2018-03-23 ENCOUNTER — Other Ambulatory Visit: Payer: Self-pay | Admitting: *Deleted

## 2018-03-23 MED ORDER — AMLODIPINE BESYLATE 5 MG PO TABS: 5.0000 mg | ORAL_TABLET | Freq: Two times a day (BID) | ORAL | 1 refills | Status: DC

## 2018-03-23 MED ORDER — CLOPIDOGREL BISULFATE 75 MG PO TABS: 75.0000 mg | ORAL_TABLET | Freq: Every day | ORAL | 1 refills | Status: DC

## 2018-03-23 NOTE — Telephone Encounter (Signed)
New Message    *STAT* If patient is at the pharmacy, call can be transferred to refill team.   1. Which medications need to be refilled? (please list name of each medication and dose if known) amLODipine (NORVASC) 5 MG tablet, clopidogrel (PLAVIX) 75 MG tablet     2. Which pharmacy/location (including street and city if local pharmacy) is medication to be sent to? [facility] [phone] - [place], [place]  3. Do they need a 30 day or 90 day supply? 90 day   Patient has one day remaining on rx

## 2018-03-23 NOTE — Telephone Encounter (Addendum)
Patient walked in to office requesting refills of these medications. Refills sent and patient notified by front desk staff.

## 2018-04-01 ENCOUNTER — Telehealth: Payer: Self-pay | Admitting: Cardiology

## 2018-04-01 DIAGNOSIS — I2510 Atherosclerotic heart disease of native coronary artery without angina pectoris: Secondary | ICD-10-CM

## 2018-04-01 MED ORDER — NEBIVOLOL HCL 10 MG PO TABS: 10.0000 mg | ORAL_TABLET | Freq: Every day | ORAL | 3 refills | Status: DC

## 2018-04-01 NOTE — Telephone Encounter (Signed)
Rx request for Nebivolol 10 mg sent to [facility] per fax request.

## 2018-04-16 DIAGNOSIS — L720 Epidermal cyst: Secondary | ICD-10-CM | POA: Diagnosis not present

## 2018-04-16 DIAGNOSIS — L820 Inflamed seborrheic keratosis: Secondary | ICD-10-CM | POA: Diagnosis not present

## 2018-04-16 DIAGNOSIS — L821 Other seborrheic keratosis: Secondary | ICD-10-CM | POA: Diagnosis not present

## 2018-04-16 DIAGNOSIS — Z85828 Personal history of other malignant neoplasm of skin: Secondary | ICD-10-CM | POA: Diagnosis not present

## 2018-04-16 DIAGNOSIS — D485 Neoplasm of uncertain behavior of skin: Secondary | ICD-10-CM | POA: Diagnosis not present

## 2018-04-16 DIAGNOSIS — L308 Other specified dermatitis: Secondary | ICD-10-CM | POA: Diagnosis not present

## 2018-04-16 DIAGNOSIS — C44712 Basal cell carcinoma of skin of right lower limb, including hip: Secondary | ICD-10-CM | POA: Diagnosis not present

## 2018-04-16 NOTE — Progress Notes (Signed)
[name] Date of Birth: 08/10/1945 Medical Record #[id number]  History of Present Illness: [name] is seen back today for follow up CAD.  He is status post extensive stenting of the LAD with Cypher stents in the past. He had stenting of the right coronary as well. He has a diagonal branch that was jailed by the stents. Followup stress testing has been normal last in 2012 and stress Myoview in Dec. 2015 was normal. LE arterial dopplers were normal in October 2017.  He has been followed in our lipid clinic. He has a history of intolerance to numerous statins. Was tried on Crestor 5 mg daily and did well on this for 5-6 months but then developed myalgias. LDL decreased to 76. He was  switched to Livalo 2 mg daily and he has tolerated  it well. Later increased to 4 mg daily and myalgias returned. Dose was reduced back to 2 mg daily and Zetia started.  On follow up today he is really feeling pretty good. Notes that he takes naps more during the day. Has a harder time losing weight. No chest pain or dyspnea. No edema or palpitations.    Current Outpatient Medications on File Prior to Visit  Medication Sig Dispense Refill  . amLODipine (NORVASC) 5 MG tablet Take 1 tablet (5 mg total) by mouth 2 (two) times daily. 180 tablet 1  . aspirin 81 MG tablet Take 81 mg by mouth daily.      . carbamazepine (TEGRETOL) 200 MG tablet TAKE 1 TABLET BY MOUTH FOUR TIMES DAILY 360 tablet 1  . clopidogrel (PLAVIX) 75 MG tablet Take 1 tablet (75 mg total) by mouth daily. 90 tablet 1  . ezetimibe (ZETIA) 10 MG tablet Take 1 tablet (10 mg total) by mouth daily. 30 tablet 11  . hydrALAZINE (APRESOLINE) 50 MG tablet Take 1 tablet by mouth 2 (two) times daily.  0  . nebivolol (BYSTOLIC) 10 MG tablet Take 1 tablet (10 mg total) by mouth daily. 90 tablet 3  . olmesartan (BENICAR) 40 MG tablet Take 1 tablet (40 mg total) by mouth daily. 90 tablet 3  . Pitavastatin Calcium (LIVALO) 2 MG TABS Take 1 tablet (2 mg total) by  mouth daily. 90 tablet 3  . valACYclovir (VALTREX) 500 MG tablet Take 250 mg by mouth every other day.      No current facility-administered medications on file prior to visit.     No Known Allergies  Past Medical History:  Diagnosis Date  . Cervical spine disease    History of cervical spine disease  . Coronary artery disease    s/p extensive stenting of the mid to distal LAD with 3 Cypher stents and past stenting of the mid RCA. He does have a diagonal branch that was jailed. He has stable angina. Negative myoview in October of 2012; Managed medically.   . Hypercholesterolemia   . Hypertension   . Knee pain   . Seizure disorder ([place])   . Sleep apnea    use to wear cpap  . Vagal reaction     Past Surgical History:  Procedure Laterality Date  . COLONOSCOPY    . coronary artery stent placement    . HERNIA REPAIR     right groin  . SHOULDER ARTHROSCOPY     right  . VASECTOMY      Social History   Tobacco Use  Smoking Status Former Smoker  . Last attempt to quit: 07/03/1965  . Years since quitting: 21.8  Smokeless Tobacco Never Used    Social History   Substance and Sexual Activity  Alcohol Use Yes  . Alcohol/week: 0.0 standard drinks   Comment: 2 beers/month    Family History  Problem Relation Age of Onset  . Heart failure Father 26  . Hyperlipidemia Mother   . Colon cancer Neg Hx     Review of Systems: The review of systems is per the HPI.  . All other systems were reviewed and are negative.  Physical Exam: BP (!) 128/56   Pulse 67   Ht 6\' 1"  (1.854 m)   Wt 209 lb 12.8 oz (95.2 kg)   BMI 27.68 kg/m  GENERAL:  Well appearing WM in NAD HEENT:  PERRL, EOMI, sclera are clear. Oropharynx is clear. NECK:  No jugular venous distention, carotid upstroke brisk and symmetric, no bruits, no thyromegaly or adenopathy LUNGS:  Clear to auscultation bilaterally CHEST:  Unremarkable HEART:  RRR,  PMI not displaced or sustained,S1 and S2 within normal limits, no  S3, no S4: no clicks, no rubs, no murmurs ABD:  Soft, nontender. BS +, no masses or bruits. No hepatomegaly, no splenomegaly EXT:  2 + pulses throughout, no edema, no cyanosis no clubbing SKIN:  Warm and dry.  No rashes NEURO:  Alert and oriented x 3. Cranial nerves II through XII intact. PSYCH:  Cognitively intact  LABORATORY DATA: Lab Results  Component Value Date   WBC 6.1 10/09/2015   HGB 13.3 10/09/2015   HCT 38.3 10/09/2015   PLT 210 10/09/2015   GLUCOSE 105 (H) 10/09/2015   CHOL 197 03/19/2017   TRIG 67 03/19/2017   HDL 77 03/19/2017   LDLCALC 107 (H) 03/19/2017   ALT 21 03/19/2017   AST 19 03/19/2017   NA 133 (L) 10/09/2015   K 5.3 (H) 10/09/2015   CL 94 (L) 10/09/2015   CREATININE 1.24 10/09/2015   BUN 27 10/09/2015   CO2 23 10/09/2015   INR 1.2 08/16/2007    Dated 06/08/17: cholesterol 181, triglycerides 102, HDL 62, LDL 99. Creatinine 1.3. Otherwise chemistries, CBC, TSH normal  Ecg today shows NSR with PACs and PVCs. LAD. Incomplete RBBB. Nonspecific ST abnormality. I have personally reviewed and interpreted this study.   Assessment / Plan: 1. Coronary disease with prior extensive stenting of the LAD and right coronaries with first generation Cypher stents. Anginal symptoms are stable class 1 on medical therapy.  Myoview in Dec. 2015  showed no evidence of ischemia.  Continue DAPT with ASA and Plavix long term.   2. Hypertension. Blood pressure is well controlled. On 4 BP medications.   3. Hypercholesterolemia.  On Livalo now 2 mg daily and Zetia. Scheduled for follow up labs per Pharm D  4. Chronotropic incompetence. Noted on prior stress test and beta blocker was reduced.  HR improved.  I will follow up in 6 months.

## 2018-04-19 ENCOUNTER — Ambulatory Visit: Payer: PPO | Admitting: Cardiology

## 2018-04-19 ENCOUNTER — Encounter: Payer: Self-pay | Admitting: Cardiology

## 2018-04-19 VITALS — BP 128/56 | HR 67 | Ht 73.0 in | Wt 209.8 lb

## 2018-04-19 DIAGNOSIS — I2510 Atherosclerotic heart disease of native coronary artery without angina pectoris: Secondary | ICD-10-CM | POA: Diagnosis not present

## 2018-04-19 DIAGNOSIS — E7800 Pure hypercholesterolemia, unspecified: Secondary | ICD-10-CM | POA: Diagnosis not present

## 2018-04-19 DIAGNOSIS — I10 Essential (primary) hypertension: Secondary | ICD-10-CM

## 2018-04-27 ENCOUNTER — Other Ambulatory Visit: Payer: PPO

## 2018-04-27 DIAGNOSIS — E7800 Pure hypercholesterolemia, unspecified: Secondary | ICD-10-CM

## 2018-04-27 LAB — HEPATIC FUNCTION PANEL
ALT: 20 IU/L (ref 0–44)
AST: 21 IU/L (ref 0–40)
Albumin: 4.4 g/dL (ref 3.7–4.7)
Alkaline Phosphatase: 75 IU/L (ref 39–117)
Bilirubin Total: 0.3 mg/dL (ref 0.0–1.2)
Bilirubin, Direct: 0.09 mg/dL (ref 0.00–0.40)
Total Protein: 6.5 g/dL (ref 6.0–8.5)

## 2018-04-27 LAB — LIPID PANEL
Chol/HDL Ratio: 2.4 ratio (ref 0.0–5.0)
Cholesterol, Total: 167 mg/dL (ref 100–199)
HDL: 71 mg/dL (ref >39)
LDL Calculated: 84 mg/dL (ref 0–99)
Triglycerides: 61 mg/dL (ref 0–149)
VLDL Cholesterol Cal: 12 mg/dL (ref 5–40)

## 2018-06-02 ENCOUNTER — Other Ambulatory Visit: Payer: Self-pay | Admitting: Neurology

## 2018-07-26 ENCOUNTER — Telehealth: Payer: Self-pay | Admitting: Cardiology

## 2018-07-26 NOTE — Telephone Encounter (Signed)
New Message    Pt is calling wanting to schedule his follow up appt with Dr Martinique, There Is no appts available and offered something with a PA but he only wants to see Dr Martinique    Please call

## 2018-07-26 NOTE — Telephone Encounter (Signed)
Returned call to patient no answer.No voice mail. 

## 2018-07-30 NOTE — Telephone Encounter (Signed)
Spoke to patient he sated he is doing good.Stated he is due to see Dr.Jordan.Virtual appointment scheduled with Dr.Jordan 09/24/18 at 9:20 am.Advised to call sooner if needed.

## 2018-08-24 DIAGNOSIS — C44629 Squamous cell carcinoma of skin of left upper limb, including shoulder: Secondary | ICD-10-CM | POA: Diagnosis not present

## 2018-08-24 DIAGNOSIS — L738 Other specified follicular disorders: Secondary | ICD-10-CM | POA: Diagnosis not present

## 2018-08-24 DIAGNOSIS — L570 Actinic keratosis: Secondary | ICD-10-CM | POA: Diagnosis not present

## 2018-08-24 DIAGNOSIS — D485 Neoplasm of uncertain behavior of skin: Secondary | ICD-10-CM | POA: Diagnosis not present

## 2018-08-24 DIAGNOSIS — L578 Other skin changes due to chronic exposure to nonionizing radiation: Secondary | ICD-10-CM | POA: Diagnosis not present

## 2018-08-24 DIAGNOSIS — L820 Inflamed seborrheic keratosis: Secondary | ICD-10-CM | POA: Diagnosis not present

## 2018-08-24 DIAGNOSIS — Z85828 Personal history of other malignant neoplasm of skin: Secondary | ICD-10-CM | POA: Diagnosis not present

## 2018-08-24 DIAGNOSIS — L821 Other seborrheic keratosis: Secondary | ICD-10-CM | POA: Diagnosis not present

## 2018-09-24 ENCOUNTER — Telehealth: Payer: PPO | Admitting: Cardiology

## 2018-10-05 DIAGNOSIS — L570 Actinic keratosis: Secondary | ICD-10-CM | POA: Diagnosis not present

## 2018-10-05 DIAGNOSIS — Z85828 Personal history of other malignant neoplasm of skin: Secondary | ICD-10-CM | POA: Diagnosis not present

## 2018-10-05 DIAGNOSIS — L821 Other seborrheic keratosis: Secondary | ICD-10-CM | POA: Diagnosis not present

## 2018-10-05 DIAGNOSIS — L820 Inflamed seborrheic keratosis: Secondary | ICD-10-CM | POA: Diagnosis not present

## 2018-10-18 ENCOUNTER — Ambulatory Visit (INDEPENDENT_AMBULATORY_CARE_PROVIDER_SITE_OTHER): Payer: PPO | Admitting: Neurology

## 2018-10-18 ENCOUNTER — Other Ambulatory Visit: Payer: Self-pay

## 2018-10-18 ENCOUNTER — Encounter: Payer: Self-pay | Admitting: Neurology

## 2018-10-18 VITALS — BP 163/65 | HR 48 | Temp 96.8°F | Ht 73.0 in | Wt 205.3 lb

## 2018-10-18 DIAGNOSIS — G40909 Epilepsy, unspecified, not intractable, without status epilepticus: Secondary | ICD-10-CM | POA: Diagnosis not present

## 2018-10-18 DIAGNOSIS — Z5181 Encounter for therapeutic drug level monitoring: Secondary | ICD-10-CM | POA: Diagnosis not present

## 2018-10-18 MED ORDER — CARBAMAZEPINE 200 MG PO TABS: ORAL_TABLET | ORAL | 1 refills | Status: DC

## 2018-10-18 MED ORDER — CARBAMAZEPINE 200 MG PO TABS: 200.0000 mg | ORAL_TABLET | Freq: Four times a day (QID) | ORAL | 1 refills | Status: DC

## 2018-10-18 NOTE — Progress Notes (Signed)
Reason for visit: Seizures  [name] is an 73 y.o. male  History of present illness:  [name] is a 73 year old right-handed white male with a history of seizures that have been well controlled on carbamazepine.  He takes 3.5 of the 200 mg tablets of carbamazepine daily, he tolerates this well.  He is on generic medication.  He has a lot of arthritic issues, he wishes to take CBD oil.  The patient otherwise reports no other significant medical issues that have come up since last seen.  Past Medical History:  Diagnosis Date  . Cervical spine disease    History of cervical spine disease  . Coronary artery disease    s/p extensive stenting of the mid to distal LAD with 3 Cypher stents and past stenting of the mid RCA. He does have a diagonal branch that was jailed. He has stable angina. Negative myoview in October of 2012; Managed medically.   . Hypercholesterolemia   . Hypertension   . Knee pain   . Seizure disorder ([place])   . Sleep apnea    use to wear cpap  . Vagal reaction     Past Surgical History:  Procedure Laterality Date  . COLONOSCOPY    . coronary artery stent placement    . HERNIA REPAIR     right groin  . SHOULDER ARTHROSCOPY     right  . VASECTOMY      Family History  Problem Relation Age of Onset  . Heart failure Father 63  . Hyperlipidemia Mother   . Colon cancer Neg Hx     Social history:  reports that he quit smoking about 53 years ago. He has never used smokeless tobacco. He reports current alcohol use. He reports that he does not use drugs.   No Known Allergies  Medications:  Prior to Admission medications   Medication Sig Start Date End Date Taking? Authorizing Provider  amLODipine (NORVASC) 5 MG tablet Take 1 tablet (5 mg total) by mouth 2 (two) times daily. 03/23/18  Yes Martinique, Peter M, MD  aspirin 81 MG tablet Take 81 mg by mouth daily.     Yes [provider]  carbamazepine (TEGRETOL) 200 MG tablet TAKE 1 TABLET BY MOUTH FOUR  TIMES DAILY 06/02/18  Yes Kathrynn Ducking, MD  clopidogrel (PLAVIX) 75 MG tablet Take 1 tablet (75 mg total) by mouth daily. 03/23/18  Yes Martinique, Peter M, MD  hydrALAZINE (APRESOLINE) 50 MG tablet Take 1 tablet by mouth 2 (two) times daily. 03/19/17  Yes [provider]  nebivolol (BYSTOLIC) 10 MG tablet Take 1 tablet (10 mg total) by mouth daily. 04/01/18  Yes Martinique, Peter M, MD  olmesartan (BENICAR) 40 MG tablet Take 1 tablet (40 mg total) by mouth daily. 03/20/17  Yes Martinique, Peter M, MD  Pitavastatin Calcium (LIVALO) 2 MG TABS Take 1 tablet (2 mg total) by mouth daily. 03/18/18  Yes Martinique, Peter M, MD  valACYclovir (VALTREX) 500 MG tablet Take 250 mg by mouth every other day.    Yes [provider]  ezetimibe (ZETIA) 10 MG tablet Take 1 tablet (10 mg total) by mouth daily. 01/19/18 04/19/18  Martinique, Peter M, MD    ROS:  Out of a complete 14 system review of symptoms, the patient complains only of the following symptoms, and all other reviewed systems are negative.  Arthritis pain  Blood pressure (!) 163/65, pulse (!) 48, temperature (!) 96.8 F (36 C), temperature source Temporal,  height 6\' 1"  (1.854 m), weight 205 lb 5 oz (93.1 kg).  Physical Exam  General: The patient is alert and cooperative at the time of the examination.  Skin: No significant peripheral edema is noted.   Neurologic Exam  Mental status: The patient is alert and oriented x 3 at the time of the examination. The patient has apparent normal recent and remote memory, with an apparently normal attention span and concentration ability.   Cranial nerves: Facial symmetry is present. Speech is normal, no aphasia or dysarthria is noted. Extraocular movements are full. Visual fields are full.  Motor: The patient has good strength in all 4 extremities.  Sensory examination: Soft touch sensation is symmetric on the face, arms, and legs.  Coordination: The patient has good finger-nose-finger and heel-to-shin  bilaterally.  Gait and station: The patient has a normal gait. Tandem gait is normal. Romberg is negative. No drift is seen.  Reflexes: Deep tendon reflexes are symmetric.   Assessment/Plan:  1.  History of seizures  The patient should be okay for use of CBD oil for arthritis.  The patient will continue his carbamazepine, he we will check blood work today.  He will follow-up in 1 year.  Dustin Alexanders MD 10/18/2018 10:12 AM  [facility] [address] [place], [place] [address]  Phone [phone] Fax [phone]

## 2018-10-19 LAB — CBC WITH DIFFERENTIAL/PLATELET
Basophils Absolute: 0 10*3/uL (ref 0.0–0.2)
Basos: 0 %
EOS (ABSOLUTE): 0.1 10*3/uL (ref 0.0–0.4)
Eos: 2 %
Hematocrit: 38.9 % (ref 37.5–51.0)
Hemoglobin: 13 g/dL (ref 13.0–17.7)
Immature Grans (Abs): 0 10*3/uL (ref 0.0–0.1)
Immature Granulocytes: 0 %
Lymphocytes Absolute: 1.4 10*3/uL (ref 0.7–3.1)
Lymphs: 23 %
MCH: 29.3 pg (ref 26.6–33.0)
MCHC: 33.4 g/dL (ref 31.5–35.7)
MCV: 88 fL (ref 79–97)
Monocytes Absolute: 0.7 10*3/uL (ref 0.1–0.9)
Monocytes: 12 %
Neutrophils Absolute: 3.8 10*3/uL (ref 1.4–7.0)
Neutrophils: 63 %
Platelets: 214 10*3/uL (ref 150–450)
RBC: 4.44 x10E6/uL (ref 4.14–5.80)
RDW: 13.1 % (ref 11.6–15.4)
WBC: 6 10*3/uL (ref 3.4–10.8)

## 2018-10-19 LAB — CARBAMAZEPINE LEVEL, TOTAL: Carbamazepine (Tegretol), S: 12.4 ug/mL (ref 4.0–12.0)

## 2018-10-19 LAB — COMPREHENSIVE METABOLIC PANEL
ALT: 21 IU/L (ref 0–44)
AST: 22 IU/L (ref 0–40)
Albumin/Globulin Ratio: 2 (ref 1.2–2.2)
Albumin: 4.6 g/dL (ref 3.7–4.7)
Alkaline Phosphatase: 74 IU/L (ref 39–117)
BUN/Creatinine Ratio: 17 (ref 10–24)
BUN: 25 mg/dL (ref 8–27)
Bilirubin Total: 0.3 mg/dL (ref 0.0–1.2)
CO2: 22 mmol/L (ref 20–29)
Calcium: 9.1 mg/dL (ref 8.6–10.2)
Chloride: 100 mmol/L (ref 96–106)
Creatinine, Ser: 1.45 mg/dL — ABNORMAL HIGH (ref 0.76–1.27)
GFR calc Af Amer: 54 mL/min/{1.73_m2} — ABNORMAL LOW (ref >59)
GFR calc non Af Amer: 47 mL/min/{1.73_m2} — ABNORMAL LOW (ref >59)
Globulin, Total: 2.3 g/dL (ref 1.5–4.5)
Glucose: 102 mg/dL — ABNORMAL HIGH (ref 65–99)
Potassium: 5.3 mmol/L — ABNORMAL HIGH (ref 3.5–5.2)
Sodium: 136 mmol/L (ref 134–144)
Total Protein: 6.9 g/dL (ref 6.0–8.5)

## 2018-11-01 ENCOUNTER — Other Ambulatory Visit: Payer: Self-pay

## 2018-11-01 MED ORDER — OLMESARTAN MEDOXOMIL 40 MG PO TABS: 40.0000 mg | ORAL_TABLET | Freq: Every day | ORAL | 3 refills | Status: DC

## 2018-11-08 DIAGNOSIS — E7849 Other hyperlipidemia: Secondary | ICD-10-CM | POA: Diagnosis not present

## 2018-11-08 DIAGNOSIS — Z7689 Persons encountering health services in other specified circumstances: Secondary | ICD-10-CM | POA: Diagnosis not present

## 2018-11-08 DIAGNOSIS — Z23 Encounter for immunization: Secondary | ICD-10-CM | POA: Diagnosis not present

## 2018-11-08 DIAGNOSIS — Z125 Encounter for screening for malignant neoplasm of prostate: Secondary | ICD-10-CM | POA: Diagnosis not present

## 2018-11-09 DIAGNOSIS — I10 Essential (primary) hypertension: Secondary | ICD-10-CM | POA: Diagnosis not present

## 2018-11-09 DIAGNOSIS — R82998 Other abnormal findings in urine: Secondary | ICD-10-CM | POA: Diagnosis not present

## 2018-11-15 DIAGNOSIS — I2510 Atherosclerotic heart disease of native coronary artery without angina pectoris: Secondary | ICD-10-CM | POA: Diagnosis not present

## 2018-11-15 DIAGNOSIS — K219 Gastro-esophageal reflux disease without esophagitis: Secondary | ICD-10-CM | POA: Diagnosis not present

## 2018-11-15 DIAGNOSIS — G4733 Obstructive sleep apnea (adult) (pediatric): Secondary | ICD-10-CM | POA: Diagnosis not present

## 2018-11-15 DIAGNOSIS — I10 Essential (primary) hypertension: Secondary | ICD-10-CM | POA: Diagnosis not present

## 2018-11-15 DIAGNOSIS — G500 Trigeminal neuralgia: Secondary | ICD-10-CM | POA: Diagnosis not present

## 2018-11-15 DIAGNOSIS — G629 Polyneuropathy, unspecified: Secondary | ICD-10-CM | POA: Diagnosis not present

## 2018-11-15 DIAGNOSIS — E785 Hyperlipidemia, unspecified: Secondary | ICD-10-CM | POA: Diagnosis not present

## 2018-11-15 DIAGNOSIS — Z0000 Encounter for general adult medical examination without abnormal findings: Secondary | ICD-10-CM | POA: Diagnosis not present

## 2018-11-15 DIAGNOSIS — I739 Peripheral vascular disease, unspecified: Secondary | ICD-10-CM | POA: Diagnosis not present

## 2018-11-15 DIAGNOSIS — R569 Unspecified convulsions: Secondary | ICD-10-CM | POA: Diagnosis not present

## 2018-11-15 DIAGNOSIS — R600 Localized edema: Secondary | ICD-10-CM | POA: Diagnosis not present

## 2018-11-15 DIAGNOSIS — M545 Low back pain: Secondary | ICD-10-CM | POA: Diagnosis not present

## 2018-11-16 DIAGNOSIS — R10819 Abdominal tenderness, unspecified site: Secondary | ICD-10-CM | POA: Diagnosis not present

## 2018-11-25 ENCOUNTER — Encounter

## 2018-11-25 ENCOUNTER — Telehealth: Payer: Self-pay

## 2018-11-25 ENCOUNTER — Encounter: Payer: Self-pay | Admitting: Physician Assistant

## 2018-11-25 ENCOUNTER — Other Ambulatory Visit: Payer: Self-pay

## 2018-11-25 ENCOUNTER — Ambulatory Visit (INDEPENDENT_AMBULATORY_CARE_PROVIDER_SITE_OTHER): Payer: PPO | Admitting: Physician Assistant

## 2018-11-25 VITALS — BP 174/62 | HR 76 | Temp 98.0°F | Ht 73.0 in | Wt 206.0 lb

## 2018-11-25 DIAGNOSIS — Z1211 Encounter for screening for malignant neoplasm of colon: Secondary | ICD-10-CM

## 2018-11-25 DIAGNOSIS — R1084 Generalized abdominal pain: Secondary | ICD-10-CM | POA: Diagnosis not present

## 2018-11-25 DIAGNOSIS — Z86010 Personal history of colonic polyps: Secondary | ICD-10-CM | POA: Diagnosis not present

## 2018-11-25 DIAGNOSIS — K5790 Diverticulosis of intestine, part unspecified, without perforation or abscess without bleeding: Secondary | ICD-10-CM | POA: Diagnosis not present

## 2018-11-25 DIAGNOSIS — R195 Other fecal abnormalities: Secondary | ICD-10-CM | POA: Diagnosis not present

## 2018-11-25 MED ORDER — NA SULFATE-K SULFATE-MG SULF 17.5-3.13-1.6 GM/177ML PO SOLN: ORAL | 0 refills | Status: DC

## 2018-11-25 NOTE — Progress Notes (Signed)
Subjective:    Patient ID: [name], male    DOB: Dec 16, 1945, 73 y.o.   MRN: [id number]  HPI [name] is a pleasant 73 year old white male, established with Dr. Henrene Ford who is referred today by Dr. Joya Ford with complaints of left lower/left mid quadrant abdominal pain and Hemoccult positive stool. Patient has history of hypertension, coronary artery disease, claudication for which he is on Plavix, trigeminal neuralgia, sleep apnea, and seizure disorder. He had colonoscopy in November 2016 with finding of 2 polyps both between 3 and 5 mm and by path were tubular adenomas.  Noted to have internal hemorrhoids and otherwise negative exam.  He is indicated for 5-year interval follow-up. A previous colonoscopy in 2011 had shown mild sigmoid diverticulosis. Patient says he had onset of his current pain 8 or 9 weeks ago.  He describes this as a fairly constant nagging dull pain that rates a 4 out of 5 out of 10.  Seems to be more notable with sitting or bending and less noticeable when he is up and walking.  He has not noticed any exacerbation with p.o. intake or any changes with bowel movements.  No changes in bowel habits so he has had some mild constipation issues off and on.  No melena or hematochezia. Patient initially thought he may have had a musculoskeletal pain but became concerned when symptoms were persisting for multiple weeks.  He is not aware of any specific incidence of injury etc.  He does play golf and says sometimes that seems to aggravate this pain as well.  Not having any back pain. He was seen by Dr. Joya Ford about 3 weeks ago stool was tested by hematuria and positive.  He then underwent CT of the abdomen and pelvis at [facility].  He was noted to have some very small hepatic cysts, one tiny cyst in the left upper pole left kidney and otherwise had a negative exam. Hemosure was repeated and negative.  Patient is concerned because his pain is persisting, and because of the finding of heme  positive stool but uncertain how far to proceed with work-up.  He admits to being worried about colon cancer.  Review of Systems Pertinent positive and negative review of systems were noted in the above HPI section.  All other review of systems was otherwise negative.  Outpatient Encounter Medications as of 11/25/2018  Medication Sig  . amLODipine (NORVASC) 5 MG tablet Take 1 tablet (5 mg total) by mouth 2 (two) times daily.  Marland Kitchen aspirin 81 MG tablet Take 81 mg by mouth daily.    . carbamazepine (TEGRETOL) 200 MG tablet Take 1 tablet at breakfast, lunch and bedtime, 1/2 with dinner  . clopidogrel (PLAVIX) 75 MG tablet Take 1 tablet (75 mg total) by mouth daily.  . hydrALAZINE (APRESOLINE) 50 MG tablet Take 1 tablet by mouth 2 (two) times daily.  . nebivolol (BYSTOLIC) 10 MG tablet Take 1 tablet (10 mg total) by mouth daily.  Marland Kitchen olmesartan (BENICAR) 40 MG tablet Take 1 tablet (40 mg total) by mouth daily.  . Pitavastatin Calcium (LIVALO) 2 MG TABS Take 1 tablet (2 mg total) by mouth daily.  . valACYclovir (VALTREX) 500 MG tablet Take 250 mg by mouth every other day.   . ezetimibe (ZETIA) 10 MG tablet Take 1 tablet (10 mg total) by mouth daily.  . Na Sulfate-K Sulfate-Mg Sulf 17.5-3.13-1.6 GM/177ML SOLN Suprep-Use as directed   No facility-administered encounter medications on file as of 11/25/2018.    No Known  Allergies Patient Active Problem List   Diagnosis Date Noted  . Encounter for therapeutic drug monitoring 08/22/2011  . Localization-related (focal) (partial) epilepsy and epileptic syndromes with complex partial seizures, without mention of intractable epilepsy 08/22/2011  . Hypertension   . Coronary artery disease   . Hypercholesterolemia   . Seizure disorder ([place])   . PERSONAL HISTORY OF COLONIC POLYPS 11/28/2009   Social History   Socioeconomic History  . Marital status: Married    Spouse name: Not on file  . Number of children: 2  . Years of education: 95  . Highest  education level: Not on file  Occupational History  . 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 Financial resource strain: Not on file  . Food insecurity    Worry: Not on file    Inability: Not on file  . Transportation needs    Medical: Not on file    Non-medical: Not on file  Tobacco Use  . Smoking status: Former Smoker    Quit date: 07/03/1965    Years since quitting: 53.4  . Smokeless tobacco: Never Used  Substance and Sexual Activity  . Alcohol use: Yes    Alcohol/week: 0.0 standard drinks    Comment: 2 beers/month  . Drug use: No  . Sexual activity: Not on file  Lifestyle  . Physical activity    Days per week: Not on file    Minutes per session: Not on file  . Stress: Not on file  Relationships  . Social Herbalist on phone: Not on file    Gets together: Not on file    Attends religious service: Not on file    Active member of club or organization: Not on file    Attends meetings of clubs or organizations: Not on file    Relationship status: Not on file  . Intimate partner violence    Fear of current or ex partner: Not on file    Emotionally abused: Not on file    Physically abused: Not on file    Forced sexual activity: Not on file  Other Topics Concern  . Not on file  Social History Narrative   Lives at home w/ his wife   Patient drinks about 2-3 cups of caffeine daily.   Patient is right handed.    Mr. [name] family history includes Heart failure (age of onset: 45) in his father; Hyperlipidemia in his mother.      Objective:    Vitals:   11/25/18 0857  BP: (!) 174/62  Pulse: 76  Temp: 98 F (36.7 C)    Physical Exam Well-developed well-nourished older white male male in no acute distress.  , Weight, 206 BMI 27.1  HEENT; nontraumatic normocephalic, EOMI, PER R LA, sclera anicteric. Oropharynx; not examined/wearing mask  neck; supple, no JVD Cardiovascular; regular rate and rhythm with S1-S2, no murmur rub or  gallop Pulmonary; Clear bilaterally Abdomen; soft, nontender,, unable to elicit any abdominal tenderness with palpation of the left mid quadrant in the area of discomfort,, no palpable abdominal wall hernia, nondistended, no palpable mass or hepatosplenomegaly, bowel sounds are active Rectal; not done, recent Hemosure positive, then negative Skin; benign exam, no jaundice rash or appreciable lesions Extremities; no clubbing cyanosis or edema skin warm and dry Neuro/Psych; alert and oriented x4, grossly nonfocal mood and affect appropriate       Assessment & Plan:   #25 73 year old white male with 8 and 9-week  history of persistent left mid quadrant abdominal pain and recent Hemoccult positive stool. CT of the abdomen and pelvis was negative, then repeat stool testing negative.  Etiology of pain is not clear, this may be of musculoskeletal etiology, but does not explain heme positive stool Rule out symptomatic diverticular disease, rule out occult colon lesion  2 history of adenomatous colon polyps- 3 history of claudication-on Plavix 4.  Trigeminal neuralgia 5.  Sleep apnea 6.  Seizure disorder 7.  Coronary artery disease 8.  Hypertension  Plan;Discussion today with the patient regarding options.  He would like to proceed with colonoscopy with Dr. Henrene Ford.  Procedure was discussed in detail with the patient including indications risks and benefits and he is agreeable to proceed. Patient will need to hold Plavix for 5 days prior to colonoscopy, we will communicate with Dr. Reynaldo Minium to assure this is reasonable for this patient.  Maryl Blalock S Ameira Alessandrini PA-C 11/25/2018   Cc: Burnard Bunting, MD

## 2018-11-25 NOTE — Telephone Encounter (Signed)
[facility] [address] [place], [place]  [address] Phone:  [phone]   Fax:  [phone]   11/25/2018   RE:      [name] DOB:   1945-10-02 MRN:   [id number]   Dear Dr. Reynaldo Minium,    We have scheduled the above patient for an endoscopic procedure. Our records show that he is on anticoagulation therapy.   Please advise as to whether the patient may come off his therapy of Plavix five days prior to the colonoscopy procedure, which is scheduled for 01/05/19.  Please fax back to [place], [place] at [phone].   Sincerely,    Thurmon Fair, RMA

## 2018-11-25 NOTE — Patient Instructions (Signed)
If you are age 74 or older, your body mass index should be between 23-30. Your Body mass index is 27.18 kg/m. If this is out of the aforementioned range listed, please consider follow up with your Primary Care Provider.  If you are age 62 or younger, your body mass index should be between 19-25. Your Body mass index is 27.18 kg/m. If this is out of the aformentioned range listed, please consider follow up with your Primary Care Provider.   You have been scheduled for a colonoscopy. Please follow written instructions given to you at your visit today.  Please pick up your prep supplies at the pharmacy within the next 1-3 days. If you use inhalers (even only as needed), please bring them with you on the day of your procedure. Your physician has requested that you go to www.startemmi.com and enter the access code given to you at your visit today. This web site gives a general overview about your procedure. However, you should still follow specific instructions given to you by our office regarding your preparation for the procedure.  We have sent the following medications to your pharmacy for you to pick up at your convenience: [place] will be contacted by our office prior to your procedure for directions on holding your Plavix.  If you do not hear from our office 1 week prior to your scheduled procedure, please call [phone] to discuss.   Thank you for choosing me and [facility].   Amy Esterwood, PA-C

## 2018-11-26 NOTE — Progress Notes (Signed)
Assessment and plan reviewed 

## 2018-11-30 ENCOUNTER — Other Ambulatory Visit: Payer: Self-pay

## 2018-12-02 ENCOUNTER — Telehealth: Payer: Self-pay | Admitting: Cardiology

## 2018-12-02 ENCOUNTER — Other Ambulatory Visit: Payer: Self-pay

## 2018-12-02 MED ORDER — CLOPIDOGREL BISULFATE 75 MG PO TABS: 75.0000 mg | ORAL_TABLET | Freq: Every day | ORAL | 0 refills | Status: DC

## 2018-12-02 NOTE — Telephone Encounter (Signed)
Called in refill for medications-  Appointment 09/28

## 2018-12-02 NOTE — Telephone Encounter (Signed)
Patient needs to speak to nurse about one of his routine maintenance drugs, states he is having trouble getting it refilled and needs to speak to her.

## 2018-12-02 NOTE — Telephone Encounter (Signed)
Called patient, he needed refills on Plavix- submitted refills.  Advised more would be called in after he had his 6 month visit, patient verbalized understanding.

## 2018-12-03 NOTE — Telephone Encounter (Signed)
He may come off Plavix for the planned procedure.  Peter Martinique MD, [facility]

## 2018-12-06 ENCOUNTER — Other Ambulatory Visit: Payer: Self-pay | Admitting: Cardiology

## 2018-12-06 NOTE — Telephone Encounter (Signed)
Spoke with patient this regarding holding his Plavix for five days prior to his procedure.  Per Dr. Martinique okay to hold five days.  Patient verbalized understanding.

## 2018-12-08 ENCOUNTER — Other Ambulatory Visit: Payer: Self-pay

## 2018-12-08 MED ORDER — AMLODIPINE BESYLATE 5 MG PO TABS: 5.0000 mg | ORAL_TABLET | Freq: Two times a day (BID) | ORAL | 1 refills | Status: DC

## 2018-12-08 NOTE — Progress Notes (Signed)
[name] Date of Birth: 13-Mar-1946 Medical Record [id number]  History of Present Illness: [name] is seen back today for follow up CAD.  He is status post extensive stenting of the LAD with Cypher stents in the past. He had stenting of the right coronary as well. He has a diagonal branch that was jailed by the stents. Followup stress testing has been normal last in 2012 and stress Myoview in Dec. 2015 was normal. LE arterial dopplers were normal in October 2017.  He has been followed in our lipid clinic. He has a history of intolerance to numerous statins. Was tried on Crestor 5 mg daily and did well on this for 5-6 months but then developed myalgias. LDL decreased to 76. He was  switched to Livalo 2 mg daily and he has tolerated  it well. Later increased to 4 mg daily and myalgias returned. Dose was reduced back to 2 mg daily and Zetia started.  On follow up today he is really feeling pretty good. He misses going to the gym but is walking every day.  No chest pain or dyspnea. No edema or palpitations. Does notes some thigh pain and wonders if the statin is getting to him again.   Current Outpatient Medications on File Prior to Visit  Medication Sig Dispense Refill  . amLODipine (NORVASC) 5 MG tablet Take 1 tablet (5 mg total) by mouth 2 (two) times daily. 180 tablet 1  . aspirin 81 MG tablet Take 81 mg by mouth daily.      . carbamazepine (TEGRETOL) 200 MG tablet Take 1 tablet at breakfast, lunch and bedtime, 1/2 with dinner 315 tablet 1  . clopidogrel (PLAVIX) 75 MG tablet Take 1 tablet (75 mg total) by mouth daily. 90 tablet 0  . ezetimibe (ZETIA) 10 MG tablet TAKE 1 TABLET(10 MG) BY MOUTH DAILY 30 tablet 6  . hydrALAZINE (APRESOLINE) 50 MG tablet Take 1 tablet by mouth 2 (two) times daily.  0  . Na Sulfate-K Sulfate-Mg Sulf 17.5-3.13-1.6 GM/177ML SOLN Suprep-Use as directed 354 mL 0  . nebivolol (BYSTOLIC) 10 MG tablet Take 1 tablet (10 mg total) by mouth daily. 90 tablet 3  .  olmesartan (BENICAR) 40 MG tablet Take 1 tablet (40 mg total) by mouth daily. 90 tablet 3  . Pitavastatin Calcium (LIVALO) 2 MG TABS Take 1 tablet (2 mg total) by mouth daily. 90 tablet 3  . valACYclovir (VALTREX) 500 MG tablet Take 250 mg by mouth every other day.      No current facility-administered medications on file prior to visit.     No Known Allergies  Past Medical History:  Diagnosis Date  . Cervical spine disease    History of cervical spine disease  . Coronary artery disease    s/p extensive stenting of the mid to distal LAD with 3 Cypher stents and past stenting of the mid RCA. He does have a diagonal branch that was jailed. He has stable angina. Negative myoview in October of 2012; Managed medically.   . Hypercholesterolemia   . Hypertension   . Knee pain   . Seizure disorder ([place])   . Sleep apnea    use to wear cpap  . Vagal reaction     Past Surgical History:  Procedure Laterality Date  . COLONOSCOPY    . coronary artery stent placement    . HERNIA REPAIR     right groin  . SHOULDER ARTHROSCOPY     right  . VASECTOMY  Social History   Tobacco Use  Smoking Status Former Smoker  . Quit date: 07/03/1965  . Years since quitting: 53.4  Smokeless Tobacco Never Used    Social History   Substance and Sexual Activity  Alcohol Use Yes  . Alcohol/week: 0.0 standard drinks   Comment: 2 beers/month    Family History  Problem Relation Age of Onset  . Heart failure Father 72  . Hyperlipidemia Mother   . Colon cancer Neg Hx     Review of Systems: The review of systems is per the HPI.  . All other systems were reviewed and are negative.  Physical Exam: BP (!) 153/70   Pulse (!) 54   Temp (!) 96.9 F (36.1 C)   Ht 6\' 1"  (1.854 m)   Wt 205 lb 9.6 oz (93.3 kg)   SpO2 98%   BMI 27.13 kg/m  GENERAL:  Well appearing WM in NAD HEENT:  PERRL, EOMI, sclera are clear. Oropharynx is clear. NECK:  No jugular venous distention, carotid upstroke brisk and  symmetric, no bruits, no thyromegaly or adenopathy LUNGS:  Clear to auscultation bilaterally CHEST:  Unremarkable HEART:  RRR,  PMI not displaced or sustained,S1 and S2 within normal limits, no S3, no S4: no clicks, no rubs, no murmurs ABD:  Soft, nontender. BS +, no masses or bruits. No hepatomegaly, no splenomegaly EXT:  2 + pulses throughout, no edema, no cyanosis no clubbing SKIN:  Warm and dry.  No rashes NEURO:  Alert and oriented x 3. Cranial nerves II through XII intact. PSYCH:  Cognitively intact  LABORATORY DATA: Lab Results  Component Value Date   WBC 6.0 10/18/2018   HGB 13.0 10/18/2018   HCT 38.9 10/18/2018   PLT 214 10/18/2018   GLUCOSE 102 (H) 10/18/2018   CHOL 167 04/27/2018   TRIG 61 04/27/2018   HDL 71 04/27/2018   LDLCALC 84 04/27/2018   ALT 21 10/18/2018   AST 22 10/18/2018   NA 136 10/18/2018   K 5.3 (H) 10/18/2018   CL 100 10/18/2018   CREATININE 1.45 (H) 10/18/2018   BUN 25 10/18/2018   CO2 22 10/18/2018   INR 1.2 08/16/2007    Dated 06/08/17: cholesterol 181, triglycerides 102, HDL 62, LDL 99. Creatinine 1.3. Otherwise chemistries, CBC, TSH normal Dated 11/08/18: cholesterol 149, triglycerides 59, HDL 53, LDL 84. Creatinine 1.3. otherwise chemistries and TSH normal   Assessment / Plan: 1. Coronary disease with prior extensive stenting of the LAD and right coronaries with first generation Cypher stents. Anginal symptoms are stable class 1 on medical therapy.  Myoview in Dec. 2015  showed no evidence of ischemia.  Continue DAPT with ASA and Plavix long term.   2. Hypertension. Blood pressure is under fair control. On 4 BP medications.   3. Hypercholesterolemia.  On Livalo now 2 mg daily and Zetia. LDL 84. He understands goal is < 70. We discussed options of bemedoic acid or PCSK 9 inhibitor. He is concerned about cost. He has seen our Pharm D in the past. I offered for him to see them again to review options/cost/patient assistance. He will let me  know.  4. Chronotropic incompetence. Noted on prior stress test and beta blocker was reduced.  HR improved.  I will follow up in 6 months.

## 2018-12-13 ENCOUNTER — Other Ambulatory Visit: Payer: Self-pay

## 2018-12-13 ENCOUNTER — Encounter: Payer: Self-pay | Admitting: Cardiology

## 2018-12-13 ENCOUNTER — Ambulatory Visit (INDEPENDENT_AMBULATORY_CARE_PROVIDER_SITE_OTHER): Payer: PPO | Admitting: Cardiology

## 2018-12-13 VITALS — BP 153/70 | HR 54 | Temp 96.9°F | Ht 73.0 in | Wt 205.6 lb

## 2018-12-13 DIAGNOSIS — E7800 Pure hypercholesterolemia, unspecified: Secondary | ICD-10-CM

## 2018-12-13 DIAGNOSIS — I2510 Atherosclerotic heart disease of native coronary artery without angina pectoris: Secondary | ICD-10-CM

## 2018-12-13 DIAGNOSIS — I10 Essential (primary) hypertension: Secondary | ICD-10-CM

## 2018-12-13 MED ORDER — CLOPIDOGREL BISULFATE 75 MG PO TABS: 75.0000 mg | ORAL_TABLET | Freq: Every day | ORAL | 3 refills | Status: DC

## 2018-12-14 DIAGNOSIS — H25011 Cortical age-related cataract, right eye: Secondary | ICD-10-CM | POA: Diagnosis not present

## 2018-12-14 DIAGNOSIS — H25811 Combined forms of age-related cataract, right eye: Secondary | ICD-10-CM | POA: Diagnosis not present

## 2018-12-14 DIAGNOSIS — H2511 Age-related nuclear cataract, right eye: Secondary | ICD-10-CM | POA: Diagnosis not present

## 2018-12-29 ENCOUNTER — Encounter: Payer: Self-pay | Admitting: Internal Medicine

## 2018-12-29 ENCOUNTER — Telehealth: Payer: Self-pay | Admitting: Physician Assistant

## 2018-12-29 NOTE — Telephone Encounter (Signed)
Pt is scheduled for 01/05/19 colon with Dr. Henrene Pastor and has clearance to hold Plavix, but pt would like to know whether he can take Aspirin.

## 2018-12-29 NOTE — Telephone Encounter (Signed)
Pt advised okay to take Aspirin.  Pt verbalized understanding.

## 2019-01-04 ENCOUNTER — Telehealth: Payer: Self-pay

## 2019-01-04 ENCOUNTER — Telehealth: Payer: Self-pay | Admitting: Internal Medicine

## 2019-01-04 NOTE — Telephone Encounter (Signed)
Pt is scheduled for procedure tomorrow with Dr. Henrene Pastor. He had cataract surgery few weeks ago and was put on some medications after his surgery. He still using an steroid durezol, he states that it is an eye drop that he still needs to use for one more week. He wants to know if it is ok to have procedure. Pls call him.

## 2019-01-04 NOTE — Telephone Encounter (Signed)
Covid-19 screening questions   Do you now or have you had a fever in the last 14 days?  Do you have any respiratory symptoms of shortness of breath or cough now or in the last 14 days?  Do you have any family members or close contacts with diagnosed or suspected Covid-19 in the past 14 days?  Have you been tested for Covid-19 and found to be positive?       

## 2019-01-04 NOTE — Telephone Encounter (Signed)
Instructed pt. To continue gtts as prescribed,verbalized understanding.

## 2019-01-04 NOTE — Telephone Encounter (Signed)
Pt returned call and answered “No” to all questions.  ° °

## 2019-01-05 ENCOUNTER — Ambulatory Visit (AMBULATORY_SURGERY_CENTER): Payer: PPO | Admitting: Internal Medicine

## 2019-01-05 ENCOUNTER — Other Ambulatory Visit: Payer: Self-pay | Admitting: Internal Medicine

## 2019-01-05 ENCOUNTER — Encounter: Payer: Self-pay | Admitting: Internal Medicine

## 2019-01-05 ENCOUNTER — Other Ambulatory Visit: Payer: Self-pay

## 2019-01-05 VITALS — BP 140/59 | HR 47 | Temp 98.8°F | Resp 13 | Ht 73.0 in | Wt 206.0 lb

## 2019-01-05 DIAGNOSIS — D122 Benign neoplasm of ascending colon: Secondary | ICD-10-CM

## 2019-01-05 DIAGNOSIS — I2510 Atherosclerotic heart disease of native coronary artery without angina pectoris: Secondary | ICD-10-CM | POA: Diagnosis not present

## 2019-01-05 DIAGNOSIS — G4733 Obstructive sleep apnea (adult) (pediatric): Secondary | ICD-10-CM | POA: Diagnosis not present

## 2019-01-05 DIAGNOSIS — D125 Benign neoplasm of sigmoid colon: Secondary | ICD-10-CM

## 2019-01-05 DIAGNOSIS — I10 Essential (primary) hypertension: Secondary | ICD-10-CM | POA: Diagnosis not present

## 2019-01-05 DIAGNOSIS — R195 Other fecal abnormalities: Secondary | ICD-10-CM

## 2019-01-05 DIAGNOSIS — R1084 Generalized abdominal pain: Secondary | ICD-10-CM

## 2019-01-05 DIAGNOSIS — Z86010 Personal history of colonic polyps: Secondary | ICD-10-CM | POA: Diagnosis not present

## 2019-01-05 DIAGNOSIS — D123 Benign neoplasm of transverse colon: Secondary | ICD-10-CM

## 2019-01-05 MED ORDER — SODIUM CHLORIDE 0.9 % IV SOLN: 500.0000 mL | Freq: Once | INTRAVENOUS | Status: DC

## 2019-01-05 NOTE — Progress Notes (Signed)
Pt's states no medical or surgical changes since previsit or office visit. 

## 2019-01-05 NOTE — Progress Notes (Signed)
Called to room to assist during endoscopic procedure.  Patient ID and intended procedure confirmed with present staff. Received instructions for my participation in the procedure from the performing physician.  

## 2019-01-05 NOTE — Progress Notes (Signed)
Report to PACU, RN, vss, BBS= Clear.  

## 2019-01-05 NOTE — Op Note (Signed)
[facility] Patient Name: [name] Procedure Date: 01/05/2019 7:25 AM MRN: [id number] Endoscopist: Docia Chuck. Henrene Pastor , MD Age: 73 Referring MD:  Date of Birth: Jan 17, 1946 Gender: Male Account #: [id number] Procedure:                Colonoscopy with cold snare polypectomy x 5 Indications:              Abdominal pain in the left lower quadrant, Heme                            positive stool. Also history of multiple                            adenomatous colon polyps. Previous examinations                            2003, 2006, 2011, 2016 Medicines:                Monitored Anesthesia Care Procedure:                Pre-Anesthesia Assessment:                           - Prior to the procedure, a History and Physical                            was performed, and patient medications and                            allergies were reviewed. The patient's tolerance of                            previous anesthesia was also reviewed. The risks                            and benefits of the procedure and the sedation                            options and risks were discussed with the patient.                            All questions were answered, and informed consent                            was obtained. Prior Anticoagulants: The patient has                            taken Plavix (clopidogrel), last dose was 7 days                            prior to procedure. ASA Grade Assessment: III - A                            patient with severe systemic disease. After  reviewing the risks and benefits, the patient was                            deemed in satisfactory condition to undergo the                            procedure.                           After obtaining informed consent, the colonoscope                            was passed under direct vision. Throughout the                            procedure, the patient's blood pressure, pulse, and                      oxygen saturations were monitored continuously. The                            Colonoscope was introduced through the anus and                            advanced to the the cecum, identified by                            appendiceal orifice and ileocecal valve. The                            ileocecal valve, appendiceal orifice, and rectum                            were photographed. The quality of the bowel                            preparation was excellent. The colonoscopy was                            performed without difficulty. The patient tolerated                            the procedure well. The bowel preparation used was                            SUPREP via split dose instruction. Scope In: 8:19:21 AM Scope Out: 8:37:57 AM Scope Withdrawal Time: 0 hours 15 minutes 31 seconds  Total Procedure Duration: 0 hours 18 minutes 36 seconds  Findings:                 Five polyps were found in the sigmoid colon,                            transverse colon and ascending colon. The polyps  were 2 to 5 mm in size. These polyps were removed                            with a cold snare. Resection and retrieval were                            complete.                           The exam was otherwise without abnormality on                            direct and retroflexion views. Complications:            No immediate complications. Estimated blood loss:                            None. Estimated Blood Loss:     Estimated blood loss: none. Impression:               - Five 2 to 5 mm polyps in the sigmoid colon, in                            the transverse colon and in the ascending colon,                            removed with a cold snare. Resected and retrieved.                           - The examination was otherwise normal on direct                            and retroflexion views. Recommendation:           - Repeat colonoscopy in 5  years for surveillance.                           - Resume Plavix (clopidogrel) today at prior dose.                           - Patient has a contact number available for                            emergencies. The signs and symptoms of potential                            delayed complications were discussed with the                            patient. Return to normal activities tomorrow.                            Written discharge instructions were provided to the  patient.                           - Resume previous diet.                           - Continue present medications.                           - Await pathology results. Docia Chuck. Henrene Pastor, MD 01/05/2019 8:49:48 AM This report has been signed electronically.

## 2019-01-05 NOTE — Patient Instructions (Signed)
YOU HAD AN ENDOSCOPIC PROCEDURE TODAY AT [facility]:   Refer to the procedure report that was given to you for any specific questions about what was found during the examination.  If the procedure report does not answer your questions, please call your gastroenterologist to clarify.  If you requested that your care partner not be given the details of your procedure findings, then the procedure report has been included in a sealed envelope for you to review at your convenience later.  YOU SHOULD EXPECT: Some feelings of bloating in the abdomen. Passage of more gas than usual.  Walking can help get rid of the air that was put into your GI tract during the procedure and reduce the bloating. If you had a lower endoscopy (such as a colonoscopy or flexible sigmoidoscopy) you may notice spotting of blood in your stool or on the toilet paper. If you underwent a bowel prep for your procedure, you may not have a normal bowel movement for a few days.  Please Note:  You might notice some irritation and congestion in your nose or some drainage.  This is from the oxygen used during your procedure.  There is no need for concern and it should clear up in a day or so.  SYMPTOMS TO REPORT IMMEDIATELY:   Following lower endoscopy (colonoscopy or flexible sigmoidoscopy):  Excessive amounts of blood in the stool  Significant tenderness or worsening of abdominal pains  Swelling of the abdomen that is new, acute  Fever of 100F or higher  For urgent or emergent issues, a gastroenterologist can be reached at any hour by calling [phone].  DIET:  We do recommend a small meal at first, but then you may proceed to your regular diet.  Drink plenty of fluids but you should avoid alcoholic beverages for 24 hours.  ACTIVITY:  You should plan to take it easy for the rest of today and you should NOT DRIVE or use heavy machinery until tomorrow (because of the sedation medicines used during the test).     FOLLOW UP: Our staff will call the number listed on your records 48-72 hours following your procedure to check on you and address any questions or concerns that you may have regarding the information given to you following your procedure. If we do not reach you, we will leave a message.  We will attempt to reach you two times.  During this call, we will ask if you have developed any symptoms of COVID 19. If you develop any symptoms (ie: fever, flu-like symptoms, shortness of breath, cough etc.) before then, please call [phone].  If you test positive for Covid 19 in the 2 weeks post procedure, please call and report this information to [place].    If any biopsies were taken you will be contacted by phone or by letter within the next 1-3 weeks.  Please call us at [phone] if you have not heard about the biopsies in 3 weeks.   SIGNATURES/CONFIDENTIALITY: You and/or your care partner have signed paperwork which will be entered into your electronic medical record.  These signatures attest to the fact that that the information above on your After Visit Summary has been reviewed and is understood.  Full responsibility of the confidentiality of this discharge information lies with you and/or your care-partner.  Await pathology- next colonoscopy in 5 years  Please read over handout about polyps  Continue your medicine; resume Plavix today

## 2019-01-07 ENCOUNTER — Telehealth: Payer: Self-pay

## 2019-01-07 NOTE — Telephone Encounter (Signed)
  Follow up Call-  Call back number 01/05/2019  Post procedure Call Back phone  # [phone]  Permission to leave phone message Yes  Some recent data might be hidden     Patient questions:  Do you have a fever, pain , or abdominal swelling? No. Pain Score  0 *  Have you tolerated food without any problems? Yes.    Have you been able to return to your normal activities? Yes.    Do you have any questions about your discharge instructions: Diet   No. Medications  No. Follow up visit  No.  Do you have questions or concerns about your Care? No.  Actions: * If pain score is 4 or above: No action needed, pain <4.  1. Have you developed a fever since your procedure? no  2.   Have you had an respiratory symptoms (SOB or cough) since your procedure? no  3.   Have you tested positive for COVID 19 since your procedure no  4.   Have you had any family members/close contacts diagnosed with the COVID 19 since your procedure?  no   If yes to any of these questions please route to Joylene John, RN and Alphonsa Gin, Therapist, sports.

## 2019-01-10 ENCOUNTER — Encounter: Payer: Self-pay | Admitting: Internal Medicine

## 2019-01-11 ENCOUNTER — Other Ambulatory Visit: Payer: Self-pay

## 2019-01-11 DIAGNOSIS — Z20822 Contact with and (suspected) exposure to covid-19: Secondary | ICD-10-CM

## 2019-01-13 LAB — NOVEL CORONAVIRUS, NAA: SARS-CoV-2, NAA: NOT DETECTED

## 2019-02-28 ENCOUNTER — Other Ambulatory Visit: Payer: Self-pay | Admitting: Cardiology

## 2019-03-01 NOTE — Telephone Encounter (Signed)
Rx has been sent to the pharmacy electronically. ° °

## 2019-03-08 ENCOUNTER — Other Ambulatory Visit: Payer: Self-pay

## 2019-03-08 MED ORDER — CLOPIDOGREL BISULFATE 75 MG PO TABS: 75.0000 mg | ORAL_TABLET | Freq: Every day | ORAL | 3 refills | Status: DC

## 2019-03-23 ENCOUNTER — Other Ambulatory Visit: Payer: Self-pay

## 2019-03-23 DIAGNOSIS — I2510 Atherosclerotic heart disease of native coronary artery without angina pectoris: Secondary | ICD-10-CM

## 2019-03-23 MED ORDER — NEBIVOLOL HCL 10 MG PO TABS: 10.0000 mg | ORAL_TABLET | Freq: Every day | ORAL | 3 refills | Status: DC

## 2019-04-06 ENCOUNTER — Ambulatory Visit: Payer: PPO | Attending: Internal Medicine

## 2019-04-06 ENCOUNTER — Other Ambulatory Visit: Payer: Self-pay

## 2019-04-06 DIAGNOSIS — Z23 Encounter for immunization: Secondary | ICD-10-CM | POA: Insufficient documentation

## 2019-04-06 NOTE — Progress Notes (Signed)
   Covid-19 Vaccination Clinic  Name:  XAIVER SEEKINS    MRN: [id number] DOB: 05-26-1945  04/06/2019  Mr. Kliethermes was observed post Covid-19 immunization for 15 minutes without incidence. He was provided with Vaccine Information Sheet and instruction to access the V-Safe system.   Mr. Hanko was instructed to call 911 with any severe reactions post vaccine: Marland Kitchen Difficulty breathing  . Swelling of your face and throat  . A fast heartbeat  . A bad rash all over your body  . Dizziness and weakness    Immunizations Administered    Name Date Dose VIS Date Route   Pfizer COVID-19 Vaccine 04/06/2019  1:50 PM 0.3 mL 02/25/2019 Intramuscular   Manufacturer: [place]   Lot: [id number]   [place]: [id number]

## 2019-04-07 DIAGNOSIS — L590 Erythema ab igne [dermatitis ab igne]: Secondary | ICD-10-CM | POA: Diagnosis not present

## 2019-04-07 DIAGNOSIS — Z85828 Personal history of other malignant neoplasm of skin: Secondary | ICD-10-CM | POA: Diagnosis not present

## 2019-04-07 DIAGNOSIS — L570 Actinic keratosis: Secondary | ICD-10-CM | POA: Diagnosis not present

## 2019-04-07 DIAGNOSIS — L820 Inflamed seborrheic keratosis: Secondary | ICD-10-CM | POA: Diagnosis not present

## 2019-04-07 DIAGNOSIS — D692 Other nonthrombocytopenic purpura: Secondary | ICD-10-CM | POA: Diagnosis not present

## 2019-04-07 DIAGNOSIS — L821 Other seborrheic keratosis: Secondary | ICD-10-CM | POA: Diagnosis not present

## 2019-04-16 ENCOUNTER — Ambulatory Visit: Payer: PPO

## 2019-04-18 ENCOUNTER — Other Ambulatory Visit: Payer: Self-pay

## 2019-04-18 DIAGNOSIS — G40909 Epilepsy, unspecified, not intractable, without status epilepticus: Secondary | ICD-10-CM

## 2019-04-18 DIAGNOSIS — Z5181 Encounter for therapeutic drug level monitoring: Secondary | ICD-10-CM

## 2019-04-18 MED ORDER — CARBAMAZEPINE 200 MG PO TABS: ORAL_TABLET | ORAL | 1 refills | Status: DC

## 2019-04-27 ENCOUNTER — Ambulatory Visit: Payer: PPO

## 2019-04-27 ENCOUNTER — Ambulatory Visit: Payer: PPO | Attending: Internal Medicine

## 2019-04-27 DIAGNOSIS — Z23 Encounter for immunization: Secondary | ICD-10-CM | POA: Insufficient documentation

## 2019-04-27 NOTE — Progress Notes (Signed)
   Covid-19 Vaccination Clinic  Name:  TIVIS DESANCTIS    MRN: [id number] DOB: [birth year]  04/27/2019  Mr. Schellinger was observed post Covid-19 immunization for 15 minutes without incidence. He was provided with Vaccine Information Sheet and instruction to access the V-Safe system.   Mr. Bonczek was instructed to call 911 with any severe reactions post vaccine: Marland Kitchen Difficulty breathing  . Swelling of your face and throat  . A fast heartbeat  . A bad rash all over your body  . Dizziness and weakness    Immunizations Administered    Name Date Dose VIS Date Route   Pfizer COVID-19 Vaccine 04/27/2019 12:46 PM 0.3 mL 02/25/2019 Intramuscular   Manufacturer: [place]   Lot: [id number]   [place]: [id number]

## 2019-05-18 DIAGNOSIS — R2231 Localized swelling, mass and lump, right upper limb: Secondary | ICD-10-CM | POA: Diagnosis not present

## 2019-05-18 DIAGNOSIS — I10 Essential (primary) hypertension: Secondary | ICD-10-CM | POA: Diagnosis not present

## 2019-05-18 DIAGNOSIS — M6740 Ganglion, unspecified site: Secondary | ICD-10-CM | POA: Diagnosis not present

## 2019-05-18 DIAGNOSIS — R569 Unspecified convulsions: Secondary | ICD-10-CM | POA: Diagnosis not present

## 2019-05-18 DIAGNOSIS — Z1331 Encounter for screening for depression: Secondary | ICD-10-CM | POA: Diagnosis not present

## 2019-05-18 DIAGNOSIS — I2510 Atherosclerotic heart disease of native coronary artery without angina pectoris: Secondary | ICD-10-CM | POA: Diagnosis not present

## 2019-06-07 ENCOUNTER — Other Ambulatory Visit: Payer: Self-pay

## 2019-06-07 MED ORDER — AMLODIPINE BESYLATE 5 MG PO TABS: 5.0000 mg | ORAL_TABLET | Freq: Two times a day (BID) | ORAL | 1 refills | Status: DC

## 2019-06-13 ENCOUNTER — Other Ambulatory Visit: Payer: Self-pay

## 2019-06-13 MED ORDER — EZETIMIBE 10 MG PO TABS: ORAL_TABLET | ORAL | 3 refills | Status: DC

## 2019-06-20 NOTE — Progress Notes (Signed)
[name] Date of Birth: [birth year] Medical Record [id number]  History of Present Illness: [name] is seen back today for follow up CAD.  He is status post extensive stenting of the LAD with Cypher stents in the past. He had stenting of the right coronary as well. He has a diagonal branch that was jailed by the stents. Followup stress testing has been normal last in 2012 and stress Myoview in Dec. 2015 was normal. LE arterial dopplers were normal in October 2017.  He has been followed in our lipid clinic. He has a history of intolerance to numerous statins. Was tried on Crestor 5 mg daily and did well on this for 5-6 months but then developed myalgias. LDL decreased to 76. He was  switched to Livalo 2 mg daily and he has tolerated  it well. Later increased to 4 mg daily and myalgias returned. Dose was reduced back to 2 mg daily and Zetia started.  On follow up today he is really feeling pretty good. He really misses going to the gym and notes his conditioning is not as good. Can't work in the yard as long as he used to. Does take some naps during the day.   No chest pain or dyspnea. No edema or palpitations. Reports when he saw Dr Reynaldo Minium 6 weeks ago BP was normal.    Current Outpatient Medications on File Prior to Visit  Medication Sig Dispense Refill  . amLODipine (NORVASC) 5 MG tablet Take 1 tablet (5 mg total) by mouth 2 (two) times daily. 180 tablet 1  . aspirin 81 MG tablet Take 81 mg by mouth daily.      . carbamazepine (TEGRETOL) 200 MG tablet Take 1 tablet at breakfast, lunch and bedtime, 1/2 with dinner 315 tablet 1  . Cholecalciferol (VITAMIN D3) 25 MCG (1000 UT) CAPS Take by mouth.    . clopidogrel (PLAVIX) 75 MG tablet Take 1 tablet (75 mg total) by mouth daily. 90 tablet 3  . ezetimibe (ZETIA) 10 MG tablet TAKE 1 TABLET(10 MG) BY MOUTH DAILY 90 tablet 3  . hydrALAZINE (APRESOLINE) 50 MG tablet Take 1 tablet by mouth 2 (two) times daily.  0  . LIVALO 2 MG TABS TAKE 1 TABLET(2 MG)  BY MOUTH DAILY 90 tablet 2  . moxifloxacin (VIGAMOX) 0.5 % ophthalmic solution Place 1 drop into both eyes daily.    . nebivolol (BYSTOLIC) 10 MG tablet Take 1 tablet (10 mg total) by mouth daily. 90 tablet 3  . olmesartan (BENICAR) 40 MG tablet Take 1 tablet (40 mg total) by mouth daily. 90 tablet 3  . valACYclovir (VALTREX) 500 MG tablet Take 250 mg by mouth every other day.      No current facility-administered medications on file prior to visit.    No Known Allergies  Past Medical History:  Diagnosis Date  . Cervical spine disease    History of cervical spine disease  . Coronary artery disease    s/p extensive stenting of the mid to distal LAD with 3 Cypher stents and past stenting of the mid RCA. He does have a diagonal branch that was jailed. He has stable angina. Negative myoview in October of 2012; Managed medically.   . Hypercholesterolemia   . Hypertension   . Knee pain   . Seizure disorder ([place])   . Sleep apnea    use to wear cpap  . Vagal reaction     Past Surgical History:  Procedure Laterality Date  . COLONOSCOPY    .  coronary artery stent placement    . HERNIA REPAIR     right groin  . SHOULDER ARTHROSCOPY     right  . VASECTOMY      Social History   Tobacco Use  Smoking Status Former Smoker  . Quit date: 07/03/1965  . Years since quitting: 54.0  Smokeless Tobacco Never Used    Social History   Substance and Sexual Activity  Alcohol Use Yes  . Alcohol/week: 0.0 standard drinks   Comment: 2 beers/month    Family History  Problem Relation Age of Onset  . Heart failure Father 29  . Hyperlipidemia Mother   . Colon cancer Neg Hx     Review of Systems: The review of systems is per the HPI.  . All other systems were reviewed and are negative.  Physical Exam: BP (!) 178/84   Pulse (!) 53   Temp 98.1 F (36.7 C)   Ht 6\' 1"  (1.854 m)   Wt 214 lb 6.4 oz (97.3 kg)   SpO2 98%   BMI 28.29 kg/m  GENERAL:  Well appearing WM in NAD HEENT:   PERRL, EOMI, sclera are clear. Oropharynx is clear. NECK:  No jugular venous distention, carotid upstroke brisk and symmetric, no bruits, no thyromegaly or adenopathy LUNGS:  Clear to auscultation bilaterally CHEST:  Unremarkable HEART:  RRR,  PMI not displaced or sustained,S1 and S2 within normal limits, no S3, no S4: no clicks, no rubs, no murmurs ABD:  Soft, nontender. BS +, no masses or bruits. No hepatomegaly, no splenomegaly EXT:  2 + pulses throughout, no edema, no cyanosis no clubbing SKIN:  Warm and dry.  No rashes NEURO:  Alert and oriented x 3. Cranial nerves II through XII intact. PSYCH:  Cognitively intact  LABORATORY DATA: Lab Results  Component Value Date   WBC 6.0 10/18/2018   HGB 13.0 10/18/2018   HCT 38.9 10/18/2018   PLT 214 10/18/2018   GLUCOSE 102 (H) 10/18/2018   CHOL 167 04/27/2018   TRIG 61 04/27/2018   HDL 71 04/27/2018   LDLCALC 84 04/27/2018   ALT 21 10/18/2018   AST 22 10/18/2018   NA 136 10/18/2018   K 5.3 (H) 10/18/2018   CL 100 10/18/2018   CREATININE 1.45 (H) 10/18/2018   BUN 25 10/18/2018   CO2 22 10/18/2018   INR 1.2 08/16/2007    Dated 06/08/17: cholesterol 181, triglycerides 102, HDL 62, LDL 99. Creatinine 1.3. Otherwise chemistries, CBC, TSH normal Dated 11/08/18: cholesterol 149, triglycerides 59, HDL 53, LDL 84. Creatinine 1.3. otherwise chemistries and TSH normal  Ecg today shows NSR rate 53. LAFB, LVH, old septal infarct.   Assessment / Plan: 1. Coronary disease with prior extensive stenting of the LAD and right coronaries with first generation Cypher stents. Anginal symptoms are stable class 1 on medical therapy.  Myoview in Dec. 2015  showed no evidence of ischemia.  Continue DAPT with ASA and Plavix long term.  Needs to focus on lifestyle modification with increased exercise and weight control.   2. Hypertension. Blood pressure is high today. He reports normal readings at other times. Will continue Rx.   3. Hypercholesterolemia.   On Livalo now 2 mg daily and Zetia. LDL improved to 84. He understands goal is < 70. We discussed options of bemedoic acid or PCSK 9 inhibitor. He is concerned about cost.   4. Chronotropic incompetence. Noted on prior stress test and beta blocker was reduced.  HR improved.  I will follow up in  6 months.

## 2019-06-23 ENCOUNTER — Ambulatory Visit: Payer: PPO | Admitting: Cardiology

## 2019-06-23 ENCOUNTER — Encounter: Payer: Self-pay | Admitting: Cardiology

## 2019-06-23 ENCOUNTER — Other Ambulatory Visit: Payer: Self-pay

## 2019-06-23 VITALS — BP 178/84 | HR 53 | Temp 98.1°F | Ht 73.0 in | Wt 214.4 lb

## 2019-06-23 DIAGNOSIS — I2510 Atherosclerotic heart disease of native coronary artery without angina pectoris: Secondary | ICD-10-CM

## 2019-06-23 DIAGNOSIS — E7800 Pure hypercholesterolemia, unspecified: Secondary | ICD-10-CM | POA: Diagnosis not present

## 2019-06-23 DIAGNOSIS — I10 Essential (primary) hypertension: Secondary | ICD-10-CM

## 2019-07-07 ENCOUNTER — Other Ambulatory Visit: Payer: Self-pay | Admitting: Cardiology

## 2019-07-07 DIAGNOSIS — L720 Epidermal cyst: Secondary | ICD-10-CM | POA: Diagnosis not present

## 2019-07-07 DIAGNOSIS — Z85828 Personal history of other malignant neoplasm of skin: Secondary | ICD-10-CM | POA: Diagnosis not present

## 2019-07-25 ENCOUNTER — Other Ambulatory Visit: Payer: Self-pay

## 2019-07-25 DIAGNOSIS — G40909 Epilepsy, unspecified, not intractable, without status epilepticus: Secondary | ICD-10-CM

## 2019-07-25 DIAGNOSIS — Z5181 Encounter for therapeutic drug level monitoring: Secondary | ICD-10-CM

## 2019-07-25 MED ORDER — CARBAMAZEPINE 200 MG PO TABS: ORAL_TABLET | ORAL | 1 refills | Status: DC

## 2019-08-11 DIAGNOSIS — H43811 Vitreous degeneration, right eye: Secondary | ICD-10-CM | POA: Diagnosis not present

## 2019-08-29 DIAGNOSIS — R1084 Generalized abdominal pain: Secondary | ICD-10-CM | POA: Diagnosis not present

## 2019-08-29 DIAGNOSIS — R3915 Urgency of urination: Secondary | ICD-10-CM | POA: Diagnosis not present

## 2019-08-29 DIAGNOSIS — R351 Nocturia: Secondary | ICD-10-CM | POA: Diagnosis not present

## 2019-09-12 DIAGNOSIS — H43811 Vitreous degeneration, right eye: Secondary | ICD-10-CM | POA: Diagnosis not present

## 2019-09-21 ENCOUNTER — Other Ambulatory Visit: Payer: Self-pay

## 2019-09-21 MED ORDER — OLMESARTAN MEDOXOMIL 40 MG PO TABS: 40.0000 mg | ORAL_TABLET | Freq: Every day | ORAL | 2 refills | Status: DC

## 2019-09-21 NOTE — Telephone Encounter (Signed)
Rx(s) sent to pharmacy electronically.  

## 2019-10-05 DIAGNOSIS — L570 Actinic keratosis: Secondary | ICD-10-CM | POA: Diagnosis not present

## 2019-10-05 DIAGNOSIS — Z85828 Personal history of other malignant neoplasm of skin: Secondary | ICD-10-CM | POA: Diagnosis not present

## 2019-10-05 DIAGNOSIS — L820 Inflamed seborrheic keratosis: Secondary | ICD-10-CM | POA: Diagnosis not present

## 2019-10-05 DIAGNOSIS — D0472 Carcinoma in situ of skin of left lower limb, including hip: Secondary | ICD-10-CM | POA: Diagnosis not present

## 2019-10-05 DIAGNOSIS — D692 Other nonthrombocytopenic purpura: Secondary | ICD-10-CM | POA: Diagnosis not present

## 2019-10-05 DIAGNOSIS — D485 Neoplasm of uncertain behavior of skin: Secondary | ICD-10-CM | POA: Diagnosis not present

## 2019-10-05 DIAGNOSIS — L821 Other seborrheic keratosis: Secondary | ICD-10-CM | POA: Diagnosis not present

## 2019-10-19 ENCOUNTER — Ambulatory Visit: Payer: PPO | Admitting: Neurology

## 2019-11-14 DIAGNOSIS — Z125 Encounter for screening for malignant neoplasm of prostate: Secondary | ICD-10-CM | POA: Diagnosis not present

## 2019-11-14 DIAGNOSIS — E785 Hyperlipidemia, unspecified: Secondary | ICD-10-CM | POA: Diagnosis not present

## 2019-11-15 DIAGNOSIS — I10 Essential (primary) hypertension: Secondary | ICD-10-CM | POA: Diagnosis not present

## 2019-11-15 DIAGNOSIS — R82998 Other abnormal findings in urine: Secondary | ICD-10-CM | POA: Diagnosis not present

## 2019-11-15 DIAGNOSIS — Z1212 Encounter for screening for malignant neoplasm of rectum: Secondary | ICD-10-CM | POA: Diagnosis not present

## 2019-11-15 LAB — IFOBT (OCCULT BLOOD): IFOBT: NEGATIVE

## 2019-11-17 DIAGNOSIS — K219 Gastro-esophageal reflux disease without esophagitis: Secondary | ICD-10-CM | POA: Diagnosis not present

## 2019-11-17 DIAGNOSIS — E785 Hyperlipidemia, unspecified: Secondary | ICD-10-CM | POA: Diagnosis not present

## 2019-11-17 DIAGNOSIS — I10 Essential (primary) hypertension: Secondary | ICD-10-CM | POA: Diagnosis not present

## 2019-11-17 DIAGNOSIS — M6740 Ganglion, unspecified site: Secondary | ICD-10-CM | POA: Diagnosis not present

## 2019-11-17 DIAGNOSIS — G4733 Obstructive sleep apnea (adult) (pediatric): Secondary | ICD-10-CM | POA: Diagnosis not present

## 2019-11-17 DIAGNOSIS — I739 Peripheral vascular disease, unspecified: Secondary | ICD-10-CM | POA: Diagnosis not present

## 2019-11-17 DIAGNOSIS — Z0000 Encounter for general adult medical examination without abnormal findings: Secondary | ICD-10-CM | POA: Diagnosis not present

## 2019-11-19 ENCOUNTER — Other Ambulatory Visit: Payer: Self-pay | Admitting: Cardiology

## 2019-12-02 ENCOUNTER — Other Ambulatory Visit: Payer: Self-pay | Admitting: Cardiology

## 2019-12-05 ENCOUNTER — Encounter: Payer: Self-pay | Admitting: Adult Health

## 2019-12-05 ENCOUNTER — Ambulatory Visit: Payer: PPO | Admitting: Adult Health

## 2019-12-05 VITALS — BP 170/75 | HR 51 | Ht 73.0 in | Wt 212.2 lb

## 2019-12-05 DIAGNOSIS — G40909 Epilepsy, unspecified, not intractable, without status epilepticus: Secondary | ICD-10-CM

## 2019-12-05 NOTE — Patient Instructions (Signed)
Continue carbamazepine Blood work today If you have any seizure events please let us know.

## 2019-12-05 NOTE — Progress Notes (Signed)
PATIENT: [name] DOB: [birth year]  REASON FOR VISIT: follow up HISTORY FROM: patient  HISTORY OF PRESENT ILLNESS: Today 12/05/19:  [name] is a 74 year old male with a history of seizures.  He remains on carbamazepine.  He takes 3-1/2 tablets daily.  He denies any seizure events.  He operates a Teacher, music.  He is able to complete all ADLs independently.  He returns today for an evaluation.  HISTORY [name] is a 74 year old right-handed white male with a history of seizures that have been well controlled on carbamazepine.  He takes 3.5 of the 200 mg tablets of carbamazepine daily, he tolerates this well.  He is on generic medication.  He has a lot of arthritic issues, he wishes to take CBD oil.  The patient otherwise reports no other significant medical issues that have come up since last seen  REVIEW OF SYSTEMS: Out of a complete 14 system review of symptoms, the patient complains only of the following symptoms, and all other reviewed systems are negative.  See HPI  ALLERGIES: No Known Allergies  HOME MEDICATIONS: Outpatient Medications Prior to Visit  Medication Sig Dispense Refill  . amLODipine (NORVASC) 5 MG tablet TAKE 1 TABLET(5 MG) BY MOUTH TWICE DAILY 180 tablet 1  . aspirin 81 MG tablet Take 81 mg by mouth daily.      . carbamazepine (TEGRETOL) 200 MG tablet Take 1 tablet at breakfast, lunch and bedtime, 1/2 with dinner 315 tablet 1  . Cholecalciferol (VITAMIN D3) 25 MCG (1000 UT) CAPS Take by mouth.    . clopidogrel (PLAVIX) 75 MG tablet Take 1 tablet (75 mg total) by mouth daily. 90 tablet 3  . ezetimibe (ZETIA) 10 MG tablet TAKE 1 TABLET(10 MG) BY MOUTH DAILY 90 tablet 3  . hydrALAZINE (APRESOLINE) 50 MG tablet Take 1 tablet by mouth 2 (two) times daily.  0  . LIVALO 2 MG TABS TAKE 1 TABLET(2 MG) BY MOUTH DAILY 90 tablet 2  . nebivolol (BYSTOLIC) 10 MG tablet Take 1 tablet (10 mg total) by mouth daily. 90 tablet 3  . olmesartan (BENICAR) 40 MG tablet Take 1  tablet (40 mg total) by mouth daily. 90 tablet 2  . valACYclovir (VALTREX) 500 MG tablet Take 250 mg by mouth every other day.     . moxifloxacin (VIGAMOX) 0.5 % ophthalmic solution Place 1 drop into both eyes daily.     No facility-administered medications prior to visit.    PAST MEDICAL HISTORY: Past Medical History:  Diagnosis Date  . Cervical spine disease    History of cervical spine disease  . Coronary artery disease    s/p extensive stenting of the mid to distal LAD with 3 Cypher stents and past stenting of the mid RCA. He does have a diagonal branch that was jailed. He has stable angina. Negative myoview in October of 2012; Managed medically.   . Hypercholesterolemia   . Hypertension   . Knee pain   . Seizure disorder ([place])   . Sleep apnea    use to wear cpap  . Vagal reaction     PAST SURGICAL HISTORY: Past Surgical History:  Procedure Laterality Date  . COLONOSCOPY    . coronary artery stent placement    . HERNIA REPAIR     right groin  . SHOULDER ARTHROSCOPY     right  . VASECTOMY      FAMILY HISTORY: Family History  Problem Relation Age of Onset  . Heart failure Father 71  .  Hyperlipidemia Mother   . Colon cancer Neg Hx     SOCIAL HISTORY: Social History   Socioeconomic History  . Marital status: Married    Spouse name: Not on file  . Number of children: 2  . Years of education: 23  . Highest education level: Not on file  Occupational History  . Occupation: Tree surgeon    Comment: retired  Tobacco Use  . Smoking status: Former Smoker    Quit date: 07/03/1965    Years since quitting: 54.4  . Smokeless tobacco: Never Used  Vaping Use  . Vaping Use: Never used  Substance and Sexual Activity  . Alcohol use: Yes    Alcohol/week: 0.0 standard drinks    Comment: 2 beers/month  . Drug use: No  . Sexual activity: Not on file  Other Topics Concern  . Not on file  Social History Narrative   Lives at home w/ his wife   Patient drinks about 2-3  cups of caffeine daily.   Patient is right handed.   Social Determinants of Health   Financial Resource Strain:   . Difficulty of Paying Living Expenses: Not on file  Food Insecurity:   . Worried About Charity fundraiser in the Last Year: Not on file  . Ran Out of Food in the Last Year: Not on file  Transportation Needs:   . Lack of Transportation (Medical): Not on file  . Lack of Transportation (Non-Medical): Not on file  Physical Activity:   . Days of Exercise per Week: Not on file  . Minutes of Exercise per Session: Not on file  Stress:   . Feeling of Stress : Not on file  Social Connections:   . Frequency of Communication with Friends and Family: Not on file  . Frequency of Social Gatherings with Friends and Family: Not on file  . Attends Religious Services: Not on file  . Active Member of Clubs or Organizations: Not on file  . Attends Archivist Meetings: Not on file  . Marital Status: Not on file  Intimate Partner Violence:   . Fear of Current or Ex-Partner: Not on file  . Emotionally Abused: Not on file  . Physically Abused: Not on file  . Sexually Abused: Not on file      PHYSICAL EXAM  Vitals:   12/05/19 1108  BP: (!) 170/75  Pulse: (!) 51  Weight: 212 lb 3.2 oz (96.3 kg)  Height: 6\' 1"  (1.854 m)   Body mass index is 28 kg/m.  Generalized: Well developed, in no acute distress   Neurological examination  Mentation: Alert oriented to time, place, history taking. Follows all commands speech and language fluent Cranial nerve II-XII: Pupils were equal round reactive to light. Extraocular movements were full, visual field were full on confrontational test. Head turning and shoulder shrug  were normal and symmetric. Motor: The motor testing reveals 5 over 5 strength of all 4 extremities. Good symmetric motor tone is noted throughout.  Sensory: Sensory testing is intact to soft touch on all 4 extremities. No evidence of extinction is noted.    Coordination: Cerebellar testing reveals good finger-nose-finger and heel-to-shin bilaterally.  Gait and station: Gait is normal.  Reflexes: Deep tendon reflexes are symmetric and normal bilaterally.   DIAGNOSTIC DATA (LABS, IMAGING, TESTING) - I reviewed patient records, labs, notes, testing and imaging myself where available.  Lab Results  Component Value Date   WBC 6.0 10/18/2018   HGB 13.0 10/18/2018   HCT  38.9 10/18/2018   MCV 88 10/18/2018   PLT 214 10/18/2018      Component Value Date/Time   NA 136 10/18/2018 1036   K 5.3 (H) 10/18/2018 1036   CL 100 10/18/2018 1036   CO2 22 10/18/2018 1036   GLUCOSE 102 (H) 10/18/2018 1036   GLUCOSE 109 (H) 03/19/2015 0555   BUN 25 10/18/2018 1036   CREATININE 1.45 (H) 10/18/2018 1036   CALCIUM 9.1 10/18/2018 1036   PROT 6.9 10/18/2018 1036   ALBUMIN 4.6 10/18/2018 1036   AST 22 10/18/2018 1036   ALT 21 10/18/2018 1036   ALKPHOS 74 10/18/2018 1036   BILITOT 0.3 10/18/2018 1036   GFRNONAA 47 (L) 10/18/2018 1036   GFRAA 54 (L) 10/18/2018 1036   Lab Results  Component Value Date   CHOL 167 04/27/2018   HDL 71 04/27/2018   LDLCALC 84 04/27/2018   TRIG 61 04/27/2018   CHOLHDL 2.4 04/27/2018   No results found for: HGBA1C Lab Results  Component Value Date   VITAMINB12 472 09/28/2013   No results found for: TSH    ASSESSMENT AND PLAN 74 y.o. year old male  has a past medical history of Cervical spine disease, Coronary artery disease, Hypercholesterolemia, Hypertension, Knee pain, Seizure disorder ([place]), Sleep apnea, and Vagal reaction. here with :  Seizures   Continue carbamazepine 1 tablet in the morning, noon and bedtime and additional half a tablet with dinner  Blood work today  Follow-up in 1 year or sooner if needed   I spent 20 minutes of face-to-face and non-face-to-face time with patient.  This included previsit chart review, lab review, study review, order entry, electronic health record documentation,  patient education.  Ward Givens, MSN, NP-C 12/05/2019, 11:07 AM [facility] [address], [place], [place] [address] [phone]

## 2019-12-06 ENCOUNTER — Encounter: Payer: Self-pay | Admitting: *Deleted

## 2019-12-06 LAB — CBC WITH DIFFERENTIAL/PLATELET
Basophils Absolute: 0 10*3/uL (ref 0.0–0.2)
Basos: 1 %
EOS (ABSOLUTE): 0.1 10*3/uL (ref 0.0–0.4)
Eos: 1 %
Hematocrit: 39.7 % (ref 37.5–51.0)
Hemoglobin: 13.5 g/dL (ref 13.0–17.7)
Immature Grans (Abs): 0 10*3/uL (ref 0.0–0.1)
Immature Granulocytes: 0 %
Lymphocytes Absolute: 1.5 10*3/uL (ref 0.7–3.1)
Lymphs: 26 %
MCH: 29.5 pg (ref 26.6–33.0)
MCHC: 34 g/dL (ref 31.5–35.7)
MCV: 87 fL (ref 79–97)
Monocytes Absolute: 0.6 10*3/uL (ref 0.1–0.9)
Monocytes: 10 %
Neutrophils Absolute: 3.6 10*3/uL (ref 1.4–7.0)
Neutrophils: 62 %
Platelets: 200 10*3/uL (ref 150–450)
RBC: 4.58 x10E6/uL (ref 4.14–5.80)
RDW: 13.2 % (ref 11.6–15.4)
WBC: 5.8 10*3/uL (ref 3.4–10.8)

## 2019-12-06 LAB — COMPREHENSIVE METABOLIC PANEL
ALT: 20 IU/L (ref 0–44)
AST: 16 IU/L (ref 0–40)
Albumin/Globulin Ratio: 2 (ref 1.2–2.2)
Albumin: 4.6 g/dL (ref 3.7–4.7)
Alkaline Phosphatase: 83 IU/L (ref 44–121)
BUN/Creatinine Ratio: 18 (ref 10–24)
BUN: 23 mg/dL (ref 8–27)
Bilirubin Total: 0.3 mg/dL (ref 0.0–1.2)
CO2: 23 mmol/L (ref 20–29)
Calcium: 9.2 mg/dL (ref 8.6–10.2)
Chloride: 105 mmol/L (ref 96–106)
Creatinine, Ser: 1.29 mg/dL — ABNORMAL HIGH (ref 0.76–1.27)
GFR calc Af Amer: 62 mL/min/{1.73_m2} (ref >59)
GFR calc non Af Amer: 54 mL/min/{1.73_m2} — ABNORMAL LOW (ref >59)
Globulin, Total: 2.3 g/dL (ref 1.5–4.5)
Glucose: 103 mg/dL — ABNORMAL HIGH (ref 65–99)
Potassium: 5.1 mmol/L (ref 3.5–5.2)
Sodium: 139 mmol/L (ref 134–144)
Total Protein: 6.9 g/dL (ref 6.0–8.5)

## 2019-12-06 LAB — CARBAMAZEPINE LEVEL, TOTAL: Carbamazepine (Tegretol), S: 12.1 ug/mL — ABNORMAL HIGH (ref 4.0–12.0)

## 2019-12-06 NOTE — Progress Notes (Signed)
I have read the note, and I agree with the clinical assessment and plan.  Opaline Reyburn K Loanne Emery   

## 2019-12-22 NOTE — Progress Notes (Signed)
[name] Date of Birth: 1946-01-13 Medical Record #[id number]  History of Present Illness: [name] is seen back today for follow up CAD.  He is status post extensive stenting of the LAD with Cypher stents in the past. He had stenting of the right coronary as well. He has a diagonal branch that was jailed by the stents. Followup stress testing has been normal last in 2012 and stress Myoview in Dec. 2015 was normal. LE arterial dopplers were normal in October 2017.  He has been followed in our lipid clinic. He has a history of intolerance to numerous statins. Was tried on Crestor 5 mg daily and did well on this for 5-6 months but then developed myalgias. LDL decreased to 76. He was  switched to Livalo 2 mg daily and he has tolerated  it well. Later increased to 4 mg daily and myalgias returned. Dose was reduced back to 2 mg daily and Zetia started.  On follow up today he is really feeling pretty good. He has just started back working out in the gym. Notes he can't work in the yard as long as he used to.   No chest pain or dyspnea. No edema or palpitations. Reports BP has been around 170 systolic.    Current Outpatient Medications on File Prior to Visit  Medication Sig Dispense Refill  . amLODipine (NORVASC) 5 MG tablet TAKE 1 TABLET(5 MG) BY MOUTH TWICE DAILY 180 tablet 1  . aspirin 81 MG tablet Take 81 mg by mouth daily.      . carbamazepine (TEGRETOL) 200 MG tablet Take 1 tablet at breakfast, lunch and bedtime, 1/2 with dinner 315 tablet 1  . Cholecalciferol (VITAMIN D3) 25 MCG (1000 UT) CAPS Take by mouth.    . clopidogrel (PLAVIX) 75 MG tablet Take 1 tablet (75 mg total) by mouth daily. 90 tablet 3  . ezetimibe (ZETIA) 10 MG tablet TAKE 1 TABLET(10 MG) BY MOUTH DAILY 90 tablet 3  . hydrALAZINE (APRESOLINE) 50 MG tablet Take 1 tablet by mouth 2 (two) times daily.  0  . LIVALO 2 MG TABS TAKE 1 TABLET(2 MG) BY MOUTH DAILY 90 tablet 2  . nebivolol (BYSTOLIC) 10 MG tablet Take 1 tablet (10 mg  total) by mouth daily. 90 tablet 3  . olmesartan (BENICAR) 40 MG tablet Take 1 tablet (40 mg total) by mouth daily. 90 tablet 2  . valACYclovir (VALTREX) 500 MG tablet Take 250 mg by mouth every other day.      No current facility-administered medications on file prior to visit.    No Known Allergies  Past Medical History:  Diagnosis Date  . Cervical spine disease    History of cervical spine disease  . Coronary artery disease    s/p extensive stenting of the mid to distal LAD with 3 Cypher stents and past stenting of the mid RCA. He does have a diagonal branch that was jailed. He has stable angina. Negative myoview in October of 2012; Managed medically.   . Hypercholesterolemia   . Hypertension   . Knee pain   . Seizure disorder ([place])   . Sleep apnea    use to wear cpap  . Vagal reaction     Past Surgical History:  Procedure Laterality Date  . COLONOSCOPY    . coronary artery stent placement    . HERNIA REPAIR     right groin  . SHOULDER ARTHROSCOPY     right  . VASECTOMY  Social History   Tobacco Use  Smoking Status Former Smoker  . Quit date: 07/03/1965  . Years since quitting: 54.5  Smokeless Tobacco Never Used    Social History   Substance and Sexual Activity  Alcohol Use Yes  . Alcohol/week: 0.0 standard drinks   Comment: 2 beers/month    Family History  Problem Relation Age of Onset  . Heart failure Father 24  . Hyperlipidemia Mother   . Colon cancer Neg Hx     Review of Systems: The review of systems is per the HPI.  . All other systems were reviewed and are negative.  Physical Exam: BP (!) 152/68   Pulse (!) 52   Ht 6\' 1"  (1.854 m)   Wt 212 lb (96.2 kg)   SpO2 96%   BMI 27.97 kg/m  GENERAL:  Well appearing WM in NAD HEENT:  PERRL, EOMI, sclera are clear. Oropharynx is clear. NECK:  No jugular venous distention, carotid upstroke brisk and symmetric, no bruits, no thyromegaly or adenopathy LUNGS:  Clear to auscultation  bilaterally CHEST:  Unremarkable HEART:  RRR,  PMI not displaced or sustained,S1 and S2 within normal limits, no S3, no S4: no clicks, no rubs, soft 1/6 SEM LSB ABD:  Soft, nontender. BS +, no masses or bruits. No hepatomegaly, no splenomegaly EXT:  2 + pulses throughout, no edema, no cyanosis no clubbing SKIN:  Warm and dry.  No rashes NEURO:  Alert and oriented x 3. Cranial nerves II through XII intact. PSYCH:  Cognitively intact  LABORATORY DATA: Lab Results  Component Value Date   WBC 5.8 12/05/2019   HGB 13.5 12/05/2019   HCT 39.7 12/05/2019   PLT 200 12/05/2019   GLUCOSE 103 (H) 12/05/2019   CHOL 167 04/27/2018   TRIG 61 04/27/2018   HDL 71 04/27/2018   LDLCALC 84 04/27/2018   ALT 20 12/05/2019   AST 16 12/05/2019   NA 139 12/05/2019   K 5.1 12/05/2019   CL 105 12/05/2019   CREATININE 1.29 (H) 12/05/2019   BUN 23 12/05/2019   CO2 23 12/05/2019   INR 1.2 08/16/2007    Dated 06/08/17: cholesterol 181, triglycerides 102, HDL 62, LDL 99. Creatinine 1.3. Otherwise chemistries, CBC, TSH normal Dated 11/08/18: cholesterol 149, triglycerides 59, HDL 53, LDL 84. Creatinine 1.3. otherwise chemistries and TSH normal  Assessment / Plan: 1. Coronary disease with prior extensive stenting of the LAD and right coronaries with first generation Cypher stents. Anginal symptoms are stable class 1 on medical therapy.  Myoview in Dec. 2015  showed no evidence of ischemia.  Continue DAPT with ASA and Plavix long term.  Continue focus on lifestyle modification with increased exercise and weight control.   2. Hypertension. Blood pressure is high today. He reports readings of 275 systolic at home. On multiple medication.  Will continue Rx.   3. Hypercholesterolemia.  On Livalo now 2 mg daily and Zetia. Reports lab work done in August. Will request a copy.  4. Chronotropic incompetence. Noted on prior stress test and beta blocker was reduced.  HR improved.  Follow up in 6 months.

## 2019-12-26 ENCOUNTER — Other Ambulatory Visit: Payer: Self-pay

## 2019-12-26 ENCOUNTER — Ambulatory Visit: Payer: PPO | Admitting: Cardiology

## 2019-12-26 ENCOUNTER — Encounter: Payer: Self-pay | Admitting: Cardiology

## 2019-12-26 VITALS — BP 152/68 | HR 52 | Ht 73.0 in | Wt 212.0 lb

## 2019-12-26 DIAGNOSIS — I2510 Atherosclerotic heart disease of native coronary artery without angina pectoris: Secondary | ICD-10-CM

## 2019-12-26 DIAGNOSIS — I10 Essential (primary) hypertension: Secondary | ICD-10-CM | POA: Diagnosis not present

## 2019-12-26 DIAGNOSIS — E7800 Pure hypercholesterolemia, unspecified: Secondary | ICD-10-CM

## 2019-12-28 DIAGNOSIS — Z23 Encounter for immunization: Secondary | ICD-10-CM | POA: Diagnosis not present

## 2020-01-16 DIAGNOSIS — M720 Palmar fascial fibromatosis [Dupuytren]: Secondary | ICD-10-CM | POA: Diagnosis not present

## 2020-01-16 DIAGNOSIS — E663 Overweight: Secondary | ICD-10-CM | POA: Diagnosis not present

## 2020-01-16 DIAGNOSIS — I10 Essential (primary) hypertension: Secondary | ICD-10-CM | POA: Diagnosis not present

## 2020-01-23 ENCOUNTER — Other Ambulatory Visit: Payer: Self-pay | Admitting: Neurology

## 2020-01-23 ENCOUNTER — Other Ambulatory Visit: Payer: Self-pay | Admitting: Emergency Medicine

## 2020-01-23 DIAGNOSIS — Z5181 Encounter for therapeutic drug level monitoring: Secondary | ICD-10-CM

## 2020-01-23 DIAGNOSIS — G40909 Epilepsy, unspecified, not intractable, without status epilepticus: Secondary | ICD-10-CM

## 2020-02-01 DIAGNOSIS — H5203 Hypermetropia, bilateral: Secondary | ICD-10-CM | POA: Diagnosis not present

## 2020-02-01 DIAGNOSIS — H25012 Cortical age-related cataract, left eye: Secondary | ICD-10-CM | POA: Diagnosis not present

## 2020-02-01 DIAGNOSIS — H43811 Vitreous degeneration, right eye: Secondary | ICD-10-CM | POA: Diagnosis not present

## 2020-02-01 DIAGNOSIS — H2512 Age-related nuclear cataract, left eye: Secondary | ICD-10-CM | POA: Diagnosis not present

## 2020-02-20 ENCOUNTER — Telehealth: Payer: Self-pay | Admitting: Adult Health

## 2020-02-20 DIAGNOSIS — Z5181 Encounter for therapeutic drug level monitoring: Secondary | ICD-10-CM

## 2020-02-20 NOTE — Addendum Note (Signed)
Addended by: Kathrynn Ducking on: 02/20/2020 03:32 PM   Modules accepted: Orders

## 2020-02-20 NOTE — Telephone Encounter (Signed)
I called the patient.  He is running a carbamazepine level 12.1, he is feeling slightly staggering, we will cut back by half a tablet, he will take 1 tablet 3 times daily, we will recheck blood work in about 3 weeks.

## 2020-02-20 NOTE — Telephone Encounter (Signed)
I spoke to the patient. He is aware of his lab results and verbalized understanding. He is currently taking carbamazepine 200mg , 3.5 tablets daily.   He is concerned about his mildly elevated carbamazepine level and would like to know if any adjustment to the medication is needed. He is asking because he has noticed having a slight unsteady gait.

## 2020-02-20 NOTE — Telephone Encounter (Signed)
Please call patient,  laboratory evaluation in August 2021 showed  Tegretol level 12.1, this is at his baseline  Normal CBC, CMP showed elevated creatinine 1.29, normal PSA, TSH, lipid panel

## 2020-02-20 NOTE — Telephone Encounter (Signed)
Pt called requesting to speak to RN or Provider about his lab work results he had done. Pt states he has several questions. Please advise.

## 2020-03-01 ENCOUNTER — Other Ambulatory Visit: Payer: Self-pay | Admitting: Cardiology

## 2020-03-08 ENCOUNTER — Other Ambulatory Visit (INDEPENDENT_AMBULATORY_CARE_PROVIDER_SITE_OTHER): Payer: Self-pay

## 2020-03-08 DIAGNOSIS — R0981 Nasal congestion: Secondary | ICD-10-CM | POA: Diagnosis not present

## 2020-03-08 DIAGNOSIS — Z5181 Encounter for therapeutic drug level monitoring: Secondary | ICD-10-CM

## 2020-03-08 DIAGNOSIS — R519 Headache, unspecified: Secondary | ICD-10-CM | POA: Diagnosis not present

## 2020-03-08 DIAGNOSIS — J019 Acute sinusitis, unspecified: Secondary | ICD-10-CM | POA: Diagnosis not present

## 2020-03-08 DIAGNOSIS — Z1152 Encounter for screening for COVID-19: Secondary | ICD-10-CM | POA: Diagnosis not present

## 2020-03-08 DIAGNOSIS — Z0289 Encounter for other administrative examinations: Secondary | ICD-10-CM

## 2020-03-09 LAB — CARBAMAZEPINE LEVEL, TOTAL: Carbamazepine (Tegretol), S: 11.2 ug/mL (ref 4.0–12.0)

## 2020-03-19 ENCOUNTER — Other Ambulatory Visit: Payer: Self-pay

## 2020-03-19 DIAGNOSIS — I2510 Atherosclerotic heart disease of native coronary artery without angina pectoris: Secondary | ICD-10-CM

## 2020-03-19 MED ORDER — NEBIVOLOL HCL 10 MG PO TABS: 10.0000 mg | ORAL_TABLET | Freq: Every day | ORAL | 3 refills | Status: DC

## 2020-04-10 ENCOUNTER — Telehealth: Payer: Self-pay | Admitting: Cardiology

## 2020-04-10 DIAGNOSIS — L720 Epidermal cyst: Secondary | ICD-10-CM | POA: Diagnosis not present

## 2020-04-10 DIAGNOSIS — L570 Actinic keratosis: Secondary | ICD-10-CM | POA: Diagnosis not present

## 2020-04-10 DIAGNOSIS — Z85828 Personal history of other malignant neoplasm of skin: Secondary | ICD-10-CM | POA: Diagnosis not present

## 2020-04-10 DIAGNOSIS — L821 Other seborrheic keratosis: Secondary | ICD-10-CM | POA: Diagnosis not present

## 2020-04-10 DIAGNOSIS — L812 Freckles: Secondary | ICD-10-CM | POA: Diagnosis not present

## 2020-04-10 DIAGNOSIS — L820 Inflamed seborrheic keratosis: Secondary | ICD-10-CM | POA: Diagnosis not present

## 2020-04-10 NOTE — Telephone Encounter (Signed)
New Message:     Please call, question about his medicine 

## 2020-04-10 NOTE — Telephone Encounter (Signed)
Spoke to patient he wanted to know if ok to take generic bystolic.Advised I will check with Dr.Jordan tomorrow and call you back.

## 2020-04-11 NOTE — Telephone Encounter (Signed)
Called patient left message on personal voice mail Dr.Jordan advised ok to take generic Bystolic

## 2020-04-20 ENCOUNTER — Other Ambulatory Visit: Payer: Self-pay | Admitting: Neurology

## 2020-04-20 DIAGNOSIS — Z5181 Encounter for therapeutic drug level monitoring: Secondary | ICD-10-CM

## 2020-04-20 DIAGNOSIS — G40909 Epilepsy, unspecified, not intractable, without status epilepticus: Secondary | ICD-10-CM

## 2020-04-25 DIAGNOSIS — H6123 Impacted cerumen, bilateral: Secondary | ICD-10-CM | POA: Diagnosis not present

## 2020-04-25 DIAGNOSIS — H903 Sensorineural hearing loss, bilateral: Secondary | ICD-10-CM | POA: Diagnosis not present

## 2020-05-31 ENCOUNTER — Other Ambulatory Visit: Payer: Self-pay | Admitting: Cardiology

## 2020-06-07 DIAGNOSIS — R059 Cough, unspecified: Secondary | ICD-10-CM | POA: Diagnosis not present

## 2020-06-07 DIAGNOSIS — Z1152 Encounter for screening for COVID-19: Secondary | ICD-10-CM | POA: Diagnosis not present

## 2020-06-07 DIAGNOSIS — R0981 Nasal congestion: Secondary | ICD-10-CM | POA: Diagnosis not present

## 2020-06-07 DIAGNOSIS — J019 Acute sinusitis, unspecified: Secondary | ICD-10-CM | POA: Diagnosis not present

## 2020-06-13 DIAGNOSIS — M7062 Trochanteric bursitis, left hip: Secondary | ICD-10-CM | POA: Diagnosis not present

## 2020-06-13 DIAGNOSIS — I10 Essential (primary) hypertension: Secondary | ICD-10-CM | POA: Diagnosis not present

## 2020-06-13 DIAGNOSIS — M7061 Trochanteric bursitis, right hip: Secondary | ICD-10-CM | POA: Diagnosis not present

## 2020-06-15 ENCOUNTER — Other Ambulatory Visit: Payer: Self-pay | Admitting: Cardiology

## 2020-06-18 ENCOUNTER — Telehealth: Payer: Self-pay | Admitting: Cardiology

## 2020-06-18 DIAGNOSIS — M7061 Trochanteric bursitis, right hip: Secondary | ICD-10-CM | POA: Diagnosis not present

## 2020-06-18 DIAGNOSIS — M7062 Trochanteric bursitis, left hip: Secondary | ICD-10-CM | POA: Diagnosis not present

## 2020-06-18 MED ORDER — OLMESARTAN MEDOXOMIL 40 MG PO TABS
40.0000 mg | ORAL_TABLET | Freq: Every day | ORAL | 1 refills | Status: DC
Start: 2020-06-18 — End: 2020-12-11

## 2020-06-18 NOTE — Telephone Encounter (Signed)
*  STAT* If patient is at the pharmacy, call can be transferred to refill team.   1. Which medications need to be refilled? (please list name of each medication and dose if known) Olmesartan   2. Which pharmacy/location (including street and city if local pharmacy) is medication to be sent to? [facility] [address]  3. Do they need a 30 day or 90 day supply? 90 patient is out patient would like to have this fill for a year supply

## 2020-06-21 ENCOUNTER — Other Ambulatory Visit: Payer: Self-pay

## 2020-06-22 NOTE — Progress Notes (Signed)
[name] Date of Birth: [birth year] Medical Record #[id number]  History of Present Illness: [name] is seen back today for follow up CAD.  He is status post extensive stenting of the LAD with Cypher stents in the past. He had stenting of the right coronary as well. He has a diagonal branch that was jailed by the stents. Followup stress testing has been normal last in 2012 and stress Myoview in Dec. 2015 was normal. LE arterial dopplers were normal in October 2017.  He has been followed in our lipid clinic. He has a history of intolerance to numerous statins. Was tried on Crestor 5 mg daily and did well on this for 5-6 months but then developed myalgias. LDL decreased to 76. He was  switched to Livalo 2 mg daily and he has tolerated  it well. Later increased to 4 mg daily and myalgias returned. Dose was reduced back to 2 mg daily and Zetia started.  On follow up today he is really feeling well.  He still isn't working out much. Just doing a little walking. Notes he can't work in the yard as long as he used to. Has to stop after a couple of hours.   No chest pain or dyspnea. No edema or palpitations. Reports BP has been around 657 systolic.    Current Outpatient Medications on File Prior to Visit  Medication Sig Dispense Refill  . amLODipine (NORVASC) 5 MG tablet TAKE 1 TABLET(5 MG) BY MOUTH TWICE DAILY 180 tablet 2  . aspirin 81 MG tablet Take 81 mg by mouth daily.    . carbamazepine (TEGRETOL) 200 MG tablet TABLET 1 TABLET BY MOUTH AT BREAKFAST, LUNCH, AND BEDTIME. TAKE 1/2 OF A TABLET WITH DINNER 315 tablet 1  . Cholecalciferol (VITAMIN D3) 25 MCG (1000 UT) CAPS Take by mouth.    . clopidogrel (PLAVIX) 75 MG tablet TAKE 1 TABLET(75 MG) BY MOUTH DAILY 90 tablet 3  . ezetimibe (ZETIA) 10 MG tablet TAKE 1 TABLET (10 MG) BY MOUTH DAILY 90 tablet 3  . hydrALAZINE (APRESOLINE) 50 MG tablet Take 1 tablet by mouth 2 (two) times daily.  0  . LIVALO 2 MG TABS TAKE 1 TABLET(2 MG) BY MOUTH DAILY 90  tablet 1  . nebivolol (BYSTOLIC) 10 MG tablet Take 1 tablet (10 mg total) by mouth daily. 90 tablet 3  . olmesartan (BENICAR) 40 MG tablet Take 1 tablet (40 mg total) by mouth daily. 90 tablet 1  . valACYclovir (VALTREX) 500 MG tablet Take 250 mg by mouth every other day.     No current facility-administered medications on file prior to visit.    No Known Allergies  Past Medical History:  Diagnosis Date  . Cervical spine disease    History of cervical spine disease  . Coronary artery disease    s/p extensive stenting of the mid to distal LAD with 3 Cypher stents and past stenting of the mid RCA. He does have a diagonal branch that was jailed. He has stable angina. Negative myoview in October of 2012; Managed medically.   . Hypercholesterolemia   . Hypertension   . Knee pain   . Seizure disorder ([place])   . Sleep apnea    use to wear cpap  . Vagal reaction     Past Surgical History:  Procedure Laterality Date  . COLONOSCOPY    . coronary artery stent placement    . HERNIA REPAIR     right groin  . SHOULDER ARTHROSCOPY  right  . VASECTOMY      Social History   Tobacco Use  Smoking Status Former Smoker  . Quit date: 07/03/1965  . Years since quitting: 55.0  Smokeless Tobacco Never Used    Social History   Substance and Sexual Activity  Alcohol Use Yes  . Alcohol/week: 0.0 standard drinks   Comment: 2 beers/month    Family History  Problem Relation Age of Onset  . Heart failure Father 35  . Hyperlipidemia Mother   . Colon cancer Neg Hx     Review of Systems: The review of systems is per the HPI.  . All other systems were reviewed and are negative.  Physical Exam: BP 140/78   Pulse (!) 56   Ht 6\' 1"  (1.854 m)   Wt 214 lb 3.2 oz (97.2 kg)   SpO2 96%   BMI 28.26 kg/m  GENERAL:  Well appearing WM in NAD HEENT:  PERRL, EOMI, sclera are clear. Oropharynx is clear. NECK:  No jugular venous distention, carotid upstroke brisk and symmetric, no bruits, no  thyromegaly or adenopathy LUNGS:  Clear to auscultation bilaterally CHEST:  Unremarkable HEART:  RRR,  PMI not displaced or sustained,S1 and S2 within normal limits, no S3, no S4: no clicks, no rubs, soft 1/6 SEM LSB ABD:  Soft, nontender. BS +, no masses or bruits. No hepatomegaly, no splenomegaly EXT:  2 + pulses throughout, no edema, no cyanosis no clubbing SKIN:  Warm and dry.  No rashes NEURO:  Alert and oriented x 3. Cranial nerves II through XII intact. PSYCH:  Cognitively intact  LABORATORY DATA: Lab Results  Component Value Date   WBC 5.8 12/05/2019   HGB 13.5 12/05/2019   HCT 39.7 12/05/2019   PLT 200 12/05/2019   GLUCOSE 103 (H) 12/05/2019   CHOL 167 04/27/2018   TRIG 61 04/27/2018   HDL 71 04/27/2018   LDLCALC 84 04/27/2018   ALT 20 12/05/2019   AST 16 12/05/2019   NA 139 12/05/2019   K 5.1 12/05/2019   CL 105 12/05/2019   CREATININE 1.29 (H) 12/05/2019   BUN 23 12/05/2019   CO2 23 12/05/2019   INR 1.2 08/16/2007    Dated 06/08/17: cholesterol 181, triglycerides 102, HDL 62, LDL 99. Creatinine 1.3. Otherwise chemistries, CBC, TSH normal Dated 11/08/18: cholesterol 149, triglycerides 59, HDL 53, LDL 84. Creatinine 1.3. otherwise chemistries and TSH normal Dated 11/14/19: cholesterol 159, triglycerides 72, HDL 62, LDL 83.   Ecg today shows NSR with first degree AV block. Rate 56. LAFB. LVH. Possible old septal infarct. No change from last year. I have personally reviewed and interpreted this study.   Assessment / Plan: 1. Coronary disease with prior extensive stenting of the LAD and right coronaries with first generation Cypher stents. Anginal symptoms are stable class 1 on medical therapy.  Myoview in Dec. 2015  showed no evidence of ischemia. He reports he did not tolerate Lexiscan well.  Continue DAPT with ASA and Plavix long term.  Continue focus on lifestyle modification with increased exercise and weight control.   2. Hypertension. Blood pressure is  satisfactory. On multiple medication.  Will continue Rx.   3. Hypercholesterolemia.  On Livalo now 2 mg daily and Zetia. Last LDL 83. Focus on lifestyle modification.  4. Chronotropic incompetence. Noted on prior stress test and beta blocker was reduced.  HR improved.  Follow up in 6 months.

## 2020-06-26 ENCOUNTER — Other Ambulatory Visit: Payer: Self-pay

## 2020-06-26 MED ORDER — EZETIMIBE 10 MG PO TABS: ORAL_TABLET | ORAL | 3 refills | Status: DC

## 2020-06-27 ENCOUNTER — Encounter: Payer: Self-pay | Admitting: Cardiology

## 2020-06-27 ENCOUNTER — Ambulatory Visit: Payer: PPO | Admitting: Cardiology

## 2020-06-27 ENCOUNTER — Other Ambulatory Visit: Payer: Self-pay

## 2020-06-27 VITALS — BP 140/78 | HR 56 | Ht 73.0 in | Wt 214.2 lb

## 2020-06-27 DIAGNOSIS — I2510 Atherosclerotic heart disease of native coronary artery without angina pectoris: Secondary | ICD-10-CM

## 2020-06-27 DIAGNOSIS — E7800 Pure hypercholesterolemia, unspecified: Secondary | ICD-10-CM | POA: Diagnosis not present

## 2020-06-27 DIAGNOSIS — I10 Essential (primary) hypertension: Secondary | ICD-10-CM

## 2020-07-02 DIAGNOSIS — M25551 Pain in right hip: Secondary | ICD-10-CM | POA: Diagnosis not present

## 2020-07-02 DIAGNOSIS — M7061 Trochanteric bursitis, right hip: Secondary | ICD-10-CM | POA: Diagnosis not present

## 2020-07-02 DIAGNOSIS — M25552 Pain in left hip: Secondary | ICD-10-CM | POA: Diagnosis not present

## 2020-07-02 DIAGNOSIS — M7062 Trochanteric bursitis, left hip: Secondary | ICD-10-CM | POA: Diagnosis not present

## 2020-07-09 DIAGNOSIS — M25552 Pain in left hip: Secondary | ICD-10-CM | POA: Diagnosis not present

## 2020-07-09 DIAGNOSIS — M25551 Pain in right hip: Secondary | ICD-10-CM | POA: Diagnosis not present

## 2020-07-09 DIAGNOSIS — M7061 Trochanteric bursitis, right hip: Secondary | ICD-10-CM | POA: Diagnosis not present

## 2020-07-09 DIAGNOSIS — M7062 Trochanteric bursitis, left hip: Secondary | ICD-10-CM | POA: Diagnosis not present

## 2020-07-23 DIAGNOSIS — M7062 Trochanteric bursitis, left hip: Secondary | ICD-10-CM | POA: Diagnosis not present

## 2020-07-23 DIAGNOSIS — M7061 Trochanteric bursitis, right hip: Secondary | ICD-10-CM | POA: Diagnosis not present

## 2020-07-23 DIAGNOSIS — M25551 Pain in right hip: Secondary | ICD-10-CM | POA: Diagnosis not present

## 2020-07-23 DIAGNOSIS — M25552 Pain in left hip: Secondary | ICD-10-CM | POA: Diagnosis not present

## 2020-09-11 ENCOUNTER — Other Ambulatory Visit: Payer: Self-pay | Admitting: Cardiology

## 2020-09-18 ENCOUNTER — Telehealth: Payer: Self-pay | Admitting: Cardiology

## 2020-09-18 NOTE — Telephone Encounter (Signed)
Called patient, LVM to call back to discuss questions.  Left call back number.

## 2020-09-18 NOTE — Telephone Encounter (Signed)
Spoke with pt, he is frustrated because he is not able to schedule his 6 month appointment. Patient would like to talk with cheryl about getting an appointment. Aware cheryl is not in the office today but will forward for her to call him back.

## 2020-09-18 NOTE — Telephone Encounter (Signed)
PT is calling requesting a callback in regards to his visit with the doctor

## 2020-09-18 NOTE — Telephone Encounter (Signed)
[name] is returning [name]'s call. Requesting callback on cell number instead of home. Please advise.

## 2020-09-19 NOTE — Telephone Encounter (Signed)
Spoke to patient follow up appointment scheduled with Dr.Jordan 9/30 at 9:00 am.

## 2020-10-08 DIAGNOSIS — M25562 Pain in left knee: Secondary | ICD-10-CM | POA: Diagnosis not present

## 2020-10-10 DIAGNOSIS — L821 Other seborrheic keratosis: Secondary | ICD-10-CM | POA: Diagnosis not present

## 2020-10-10 DIAGNOSIS — L723 Sebaceous cyst: Secondary | ICD-10-CM | POA: Diagnosis not present

## 2020-10-10 DIAGNOSIS — L814 Other melanin hyperpigmentation: Secondary | ICD-10-CM | POA: Diagnosis not present

## 2020-10-10 DIAGNOSIS — L218 Other seborrheic dermatitis: Secondary | ICD-10-CM | POA: Diagnosis not present

## 2020-10-10 DIAGNOSIS — Z85828 Personal history of other malignant neoplasm of skin: Secondary | ICD-10-CM | POA: Diagnosis not present

## 2020-10-10 DIAGNOSIS — L570 Actinic keratosis: Secondary | ICD-10-CM | POA: Diagnosis not present

## 2020-10-10 DIAGNOSIS — L820 Inflamed seborrheic keratosis: Secondary | ICD-10-CM | POA: Diagnosis not present

## 2020-10-16 DIAGNOSIS — M7062 Trochanteric bursitis, left hip: Secondary | ICD-10-CM | POA: Diagnosis not present

## 2020-10-16 DIAGNOSIS — M7061 Trochanteric bursitis, right hip: Secondary | ICD-10-CM | POA: Diagnosis not present

## 2020-10-17 ENCOUNTER — Telehealth: Payer: Self-pay | Admitting: Cardiology

## 2020-10-17 NOTE — Telephone Encounter (Signed)
Probably ok to use tumeric cream. I would be concerned with oral tumeric about bleeding with his plavix. My only concern about tart cherry juice would be the sugar content but ok to try

## 2020-10-17 NOTE — Telephone Encounter (Signed)
Called patient left PharmD's advice on personal voice mail. 

## 2020-10-17 NOTE — Telephone Encounter (Signed)
Spoke to patient he stated his orthopaedic Dr advised him to take tart cherry juice and use turmeric cream for pain in both hips and swelling in left knee.He wanted to make sure ok to take with his other medications.Advised I will send message to our pharmacist for advice.

## 2020-10-17 NOTE — Telephone Encounter (Signed)
pt is experiencing inflamation in his hips and knee and would like to know if there is a medication he can take that will not conflict with his heart meds would prefer to speak with nurse Malachy Mood. Please contact him on his cell at [phone].. please advise.

## 2020-10-22 ENCOUNTER — Telehealth: Payer: Self-pay | Admitting: Cardiology

## 2020-10-22 NOTE — Telephone Encounter (Signed)
[id number] 

## 2020-10-22 NOTE — Telephone Encounter (Signed)
Pt c/o medication issue:  1. Name of Medication:  Voltaren   2. How are you currently taking this medication (dosage and times per day)?  Patient has not started  3. Are you having a reaction (difficulty breathing--STAT)?  No   4. What is your medication issue?   Patient is following up. He states last week there was a mix up with his orthopaedic doctor and he inquired about the wrong thing. The medication in question is Voltaren. Will this interact with his cardiac medications? Please advise.

## 2020-10-22 NOTE — Telephone Encounter (Signed)
Voltaren gel (topical) is fine to use.  We do not recommend oral Voltaren tablets.

## 2020-10-22 NOTE — Telephone Encounter (Signed)
Pt returning phone call... please advise  

## 2020-10-22 NOTE — Telephone Encounter (Signed)
Called patient, advised of previous message from [employer].  Patient verbalized understanding.  Thankful for call back.

## 2020-10-29 NOTE — Telephone Encounter (Signed)
Pt notified. All questions answered. He will call back if anything else is needed.

## 2020-11-07 ENCOUNTER — Other Ambulatory Visit: Payer: Self-pay | Admitting: Neurology

## 2020-11-07 DIAGNOSIS — Z5181 Encounter for therapeutic drug level monitoring: Secondary | ICD-10-CM

## 2020-11-07 DIAGNOSIS — G40909 Epilepsy, unspecified, not intractable, without status epilepticus: Secondary | ICD-10-CM

## 2020-11-12 DIAGNOSIS — M25562 Pain in left knee: Secondary | ICD-10-CM | POA: Diagnosis not present

## 2020-11-12 DIAGNOSIS — M25462 Effusion, left knee: Secondary | ICD-10-CM | POA: Diagnosis not present

## 2020-11-22 DIAGNOSIS — E785 Hyperlipidemia, unspecified: Secondary | ICD-10-CM | POA: Diagnosis not present

## 2020-11-22 DIAGNOSIS — G500 Trigeminal neuralgia: Secondary | ICD-10-CM | POA: Diagnosis not present

## 2020-11-22 DIAGNOSIS — Z125 Encounter for screening for malignant neoplasm of prostate: Secondary | ICD-10-CM | POA: Diagnosis not present

## 2020-11-23 DIAGNOSIS — I10 Essential (primary) hypertension: Secondary | ICD-10-CM | POA: Diagnosis not present

## 2020-11-23 DIAGNOSIS — Z1212 Encounter for screening for malignant neoplasm of rectum: Secondary | ICD-10-CM | POA: Diagnosis not present

## 2020-11-23 DIAGNOSIS — R82998 Other abnormal findings in urine: Secondary | ICD-10-CM | POA: Diagnosis not present

## 2020-11-26 DIAGNOSIS — Z1331 Encounter for screening for depression: Secondary | ICD-10-CM | POA: Diagnosis not present

## 2020-11-26 DIAGNOSIS — Z0000 Encounter for general adult medical examination without abnormal findings: Secondary | ICD-10-CM | POA: Diagnosis not present

## 2020-11-26 DIAGNOSIS — Z1339 Encounter for screening examination for other mental health and behavioral disorders: Secondary | ICD-10-CM | POA: Diagnosis not present

## 2020-11-26 DIAGNOSIS — E663 Overweight: Secondary | ICD-10-CM | POA: Diagnosis not present

## 2020-11-26 DIAGNOSIS — I2510 Atherosclerotic heart disease of native coronary artery without angina pectoris: Secondary | ICD-10-CM | POA: Diagnosis not present

## 2020-11-26 DIAGNOSIS — I10 Essential (primary) hypertension: Secondary | ICD-10-CM | POA: Diagnosis not present

## 2020-11-26 DIAGNOSIS — G4733 Obstructive sleep apnea (adult) (pediatric): Secondary | ICD-10-CM | POA: Diagnosis not present

## 2020-11-26 DIAGNOSIS — G629 Polyneuropathy, unspecified: Secondary | ICD-10-CM | POA: Diagnosis not present

## 2020-11-26 DIAGNOSIS — I739 Peripheral vascular disease, unspecified: Secondary | ICD-10-CM | POA: Diagnosis not present

## 2020-11-26 DIAGNOSIS — D689 Coagulation defect, unspecified: Secondary | ICD-10-CM | POA: Diagnosis not present

## 2020-12-04 ENCOUNTER — Telehealth: Payer: Self-pay | Admitting: Adult Health

## 2020-12-04 ENCOUNTER — Encounter: Payer: Self-pay | Admitting: Adult Health

## 2020-12-04 ENCOUNTER — Ambulatory Visit: Payer: PPO | Admitting: Adult Health

## 2020-12-04 VITALS — BP 157/70 | HR 51 | Ht 73.0 in | Wt 211.0 lb

## 2020-12-04 DIAGNOSIS — G40909 Epilepsy, unspecified, not intractable, without status epilepticus: Secondary | ICD-10-CM | POA: Diagnosis not present

## 2020-12-04 DIAGNOSIS — Z5181 Encounter for therapeutic drug level monitoring: Secondary | ICD-10-CM

## 2020-12-04 NOTE — Progress Notes (Signed)
PATIENT: [name] DOB: 09/04/1945  REASON FOR VISIT: follow up HISTORY FROM: patient  HISTORY OF PRESENT ILLNESS: Today 12/04/20:  [name] is a 75 year old male with a history of seizures.  He returns today for follow-up.  Overall he has been doing well.  Denies any seizure events.  He takes carbamazepine 200 mg 3-1/2 tablets daily.  Continues to operate a motor vehicle.  Lives at home with his spouse.  Able to complete all ADLs independently.  He returns today for an evaluation.  12/05/19 [name] is a 75 year old male with a history of seizures.  He remains on carbamazepine.  He takes 3-1/2 tablets daily.  He denies any seizure events.  He operates a Teacher, music.  He is able to complete all ADLs independently.  He returns today for an evaluation.  HISTORY [name] is a 75 year old right-handed white male with a history of seizures that have been well controlled on carbamazepine.  He takes 3.5 of the 200 mg tablets of carbamazepine daily, he tolerates this well.  He is on generic medication.  He has a lot of arthritic issues, he wishes to take CBD oil.  The patient otherwise reports no other significant medical issues that have come up since last seen  REVIEW OF SYSTEMS: Out of a complete 14 system review of symptoms, the patient complains only of the following symptoms, and all other reviewed systems are negative.  See HPI  ALLERGIES: No Known Allergies  HOME MEDICATIONS: Outpatient Medications Prior to Visit  Medication Sig Dispense Refill   amLODipine (NORVASC) 5 MG tablet TAKE 1 TABLET(5 MG) BY MOUTH TWICE DAILY 180 tablet 2   aspirin 81 MG tablet Take 81 mg by mouth daily.     carbamazepine (TEGRETOL) 200 MG tablet TAKE 1 TABLET AT BREAKFAST, LUNCH AND BEDTIME AND 1/2 TABLET WITH DINNER 315 tablet 1   Cholecalciferol (VITAMIN D3) 25 MCG (1000 UT) CAPS Take by mouth.     clopidogrel (PLAVIX) 75 MG tablet TAKE 1 TABLET(75 MG) BY MOUTH DAILY 90 tablet 3   ezetimibe  (ZETIA) 10 MG tablet TAKE 1 TABLET (10 MG) BY MOUTH DAILY 90 tablet 3   hydrALAZINE (APRESOLINE) 50 MG tablet Take 1 tablet by mouth 2 (two) times daily.  0   LIVALO 2 MG TABS TAKE 1 TABLET(2 MG) BY MOUTH DAILY 90 tablet 1   nebivolol (BYSTOLIC) 10 MG tablet Take 1 tablet (10 mg total) by mouth daily. 90 tablet 3   olmesartan (BENICAR) 40 MG tablet Take 1 tablet (40 mg total) by mouth daily. 90 tablet 1   valACYclovir (VALTREX) 500 MG tablet Take 250 mg by mouth every other day.     No facility-administered medications prior to visit.    PAST MEDICAL HISTORY: Past Medical History:  Diagnosis Date   Cervical spine disease    History of cervical spine disease   Coronary artery disease    s/p extensive stenting of the mid to distal LAD with 3 Cypher stents and past stenting of the mid RCA. He does have a diagonal branch that was jailed. He has stable angina. Negative myoview in October of 2012; Managed medically.    Hypercholesterolemia    Hypertension    Knee pain    Seizure disorder ([place])    Sleep apnea    use to wear cpap   Vagal reaction     PAST SURGICAL HISTORY: Past Surgical History:  Procedure Laterality Date   COLONOSCOPY  coronary artery stent placement     HERNIA REPAIR     right groin   SHOULDER ARTHROSCOPY     right   VASECTOMY      FAMILY HISTORY: Family History  Problem Relation Age of Onset   Heart failure Father 88   Hyperlipidemia Mother    Colon cancer Neg Hx     SOCIAL HISTORY: Social History   Socioeconomic History   Marital status: Married    Spouse name: Not on file   Number of children: 2   Years of education: 16   Highest education level: Not on file  Occupational History   Occupation: Tree surgeon    Comment: retired  Tobacco Use   Smoking status: Former    Types: Cigarettes    Quit date: 07/03/1965    Years since quitting: 55.4   Smokeless tobacco: Never  Vaping Use   Vaping Use: Never used  Substance and Sexual Activity    Alcohol use: Yes    Alcohol/week: 0.0 standard drinks    Comment: 2 beers/month   Drug use: No   Sexual activity: Not on file  Other Topics Concern   Not on file  Social History Narrative   Lives at home w/ his wife   Patient drinks about 2-3 cups of caffeine daily.   Patient is right handed.   Social Determinants of Health   Financial Resource Strain: Not on file  Food Insecurity: Not on file  Transportation Needs: Not on file  Physical Activity: Not on file  Stress: Not on file  Social Connections: Not on file  Intimate Partner Violence: Not on file      PHYSICAL EXAM  Vitals:   12/04/20 0831  BP: (!) 157/70  Pulse: (!) 51  Weight: 211 lb (95.7 kg)  Height: 6\' 1"  (1.854 m)   Body mass index is 27.84 kg/m.  Generalized: Well developed, in no acute distress   Neurological examination  Mentation: Alert oriented to time, place, history taking. Follows all commands speech and language fluent Cranial nerve II-XII: Pupils were equal round reactive to light. Extraocular movements were full, visual field were full on confrontational test. Head turning and shoulder shrug  were normal and symmetric. Motor: The motor testing reveals 5 over 5 strength of all 4 extremities. Good symmetric motor tone is noted throughout.  Sensory: Sensory testing is intact to soft touch on all 4 extremities. No evidence of extinction is noted.  Coordination: Cerebellar testing reveals good finger-nose-finger and heel-to-shin bilaterally.  Gait and station: Gait is normal.  Reflexes: Deep tendon reflexes are symmetric and normal bilaterally.   DIAGNOSTIC DATA (LABS, IMAGING, TESTING) - I reviewed patient records, labs, notes, testing and imaging myself where available.  Lab Results  Component Value Date   WBC 5.8 12/05/2019   HGB 13.5 12/05/2019   HCT 39.7 12/05/2019   MCV 87 12/05/2019   PLT 200 12/05/2019      Component Value Date/Time   NA 139 12/05/2019 1129   K 5.1 12/05/2019 1129    CL 105 12/05/2019 1129   CO2 23 12/05/2019 1129   GLUCOSE 103 (H) 12/05/2019 1129   GLUCOSE 109 (H) 03/19/2015 0555   BUN 23 12/05/2019 1129   CREATININE 1.29 (H) 12/05/2019 1129   CALCIUM 9.2 12/05/2019 1129   PROT 6.9 12/05/2019 1129   ALBUMIN 4.6 12/05/2019 1129   AST 16 12/05/2019 1129   ALT 20 12/05/2019 1129   ALKPHOS 83 12/05/2019 1129   BILITOT 0.3 12/05/2019  [place] (L) 12/05/2019 1129   GFRAA 62 12/05/2019 1129   Lab Results  Component Value Date   CHOL 167 04/27/2018   HDL 71 04/27/2018   LDLCALC 84 04/27/2018   TRIG 61 04/27/2018   CHOLHDL 2.4 04/27/2018       ASSESSMENT AND PLAN 75 y.o. year old male  has a past medical history of Cervical spine disease, Coronary artery disease, Hypercholesterolemia, Hypertension, Knee pain, Seizure disorder ([place]), Sleep apnea, and Vagal reaction. here with :  Seizures  Continue carbamazepine 200 mg  1 tablet in the morning, noon and bedtime and additional half a tablet with dinner Recently had blood work through Dr. Jacquiline Doe office.  Reports that carbamazepine level was 9.8.  We will request blood work results from their office Follow-up in 1 year or sooner if needed    Ward Givens, MSN, NP-C 12/04/2020, 8:55 AM [facility] [address], [place], [place] [address] [phone]

## 2020-12-04 NOTE — Patient Instructions (Signed)
Your Plan:  Continue carbamazepine If your symptoms worsen or you develop new symptoms please let us know.   Thank you for coming to see [place] at [facility]. I hope we have been able to provide you high quality care today.  You may receive a patient satisfaction survey over the next few weeks. We would appreciate your feedback and comments so that we may continue to improve ourselves and the health of our patients.

## 2020-12-04 NOTE — Telephone Encounter (Signed)
Pt would like a billing summary of today's visit mailed to him.

## 2020-12-10 ENCOUNTER — Other Ambulatory Visit: Payer: Self-pay | Admitting: Cardiology

## 2020-12-10 NOTE — Progress Notes (Signed)
[name] Date of Birth: Mar 31, 1945 Medical Record #[id number]  History of Present Illness: [name] is seen back today for follow up CAD.  He is status post extensive stenting of the LAD with Cypher stents in the past. He had stenting of the right coronary as well. He has a diagonal branch that was jailed by the stents. Followup stress testing has been normal last in 2012 and stress Myoview in Dec. 2015 was normal. LE arterial dopplers were normal in October 2017.  He has been followed in our lipid clinic. He has a history of intolerance to numerous statins. Was tried on Crestor 5 mg daily and did well on this for 5-6 months but then developed myalgias. LDL decreased to 76. He was  switched to Livalo 2 mg daily and he has tolerated  it well. Later increased to 4 mg daily and myalgias returned. Dose was reduced back to 2 mg daily and Zetia started.  On follow up today he is really feeling well.   No chest pain or dyspnea. No edema or palpitations. Notes more joint problems recently with back, hip bursitis, and knee problems that has needed aspiration. Has chronic bruising.    Current Outpatient Medications on File Prior to Visit  Medication Sig Dispense Refill   amLODipine (NORVASC) 5 MG tablet TAKE 1 TABLET(5 MG) BY MOUTH TWICE DAILY 180 tablet 2   aspirin 81 MG tablet Take 81 mg by mouth daily.     carbamazepine (TEGRETOL) 200 MG tablet TAKE 1 TABLET AT BREAKFAST, LUNCH AND BEDTIME AND 1/2 TABLET WITH DINNER 315 tablet 1   Cholecalciferol (VITAMIN D3) 25 MCG (1000 UT) CAPS Take by mouth.     clopidogrel (PLAVIX) 75 MG tablet TAKE 1 TABLET(75 MG) BY MOUTH DAILY 90 tablet 3   ezetimibe (ZETIA) 10 MG tablet TAKE 1 TABLET (10 MG) BY MOUTH DAILY 90 tablet 3   hydrALAZINE (APRESOLINE) 50 MG tablet Take 1 tablet by mouth 2 (two) times daily.  0   LIVALO 2 MG TABS TAKE 1 TABLET(2 MG) BY MOUTH DAILY 90 tablet 1   nebivolol (BYSTOLIC) 10 MG tablet Take 1 tablet (10 mg total) by mouth daily. 90 tablet  3   olmesartan (BENICAR) 40 MG tablet TAKE ONE TABLET EACH DAY 90 tablet 0   valACYclovir (VALTREX) 500 MG tablet Take 250 mg by mouth every other day.     No current facility-administered medications on file prior to visit.    Allergies  Allergen Reactions   Hydrochlorothiazide Other (See Comments)    Visual changes with higher dosage.    Past Medical History:  Diagnosis Date   Cervical spine disease    History of cervical spine disease   Coronary artery disease    s/p extensive stenting of the mid to distal LAD with 3 Cypher stents and past stenting of the mid RCA. He does have a diagonal branch that was jailed. He has stable angina. Negative myoview in October of 2012; Managed medically.    Hypercholesterolemia    Hypertension    Knee pain    Seizure disorder ([facility])    Sleep apnea    use to wear cpap   Vagal reaction     Past Surgical History:  Procedure Laterality Date   COLONOSCOPY     coronary artery stent placement     HERNIA REPAIR     right groin   SHOULDER ARTHROSCOPY     right   VASECTOMY      Social  History   Tobacco Use  Smoking Status Former   Types: Cigarettes   Quit date: 07/03/1965   Years since quitting: 55.4  Smokeless Tobacco Never    Social History   Substance and Sexual Activity  Alcohol Use Yes   Alcohol/week: 0.0 standard drinks   Comment: 2 beers/month    Family History  Problem Relation Age of Onset   Heart failure Father 60   Hyperlipidemia Mother    Colon cancer Neg Hx     Review of Systems: The review of systems is per the HPI.  . All other systems were reviewed and are negative.  Physical Exam: BP 140/60 (BP Location: Left Arm)   Pulse (!) 54   Ht 6\' 1"  (1.854 m)   Wt 211 lb 3.2 oz (95.8 kg)   SpO2 97%   BMI 27.86 kg/m  GENERAL:  Well appearing WM in NAD HEENT:  PERRL, EOMI, sclera are clear. Oropharynx is clear. NECK:  No jugular venous distention, carotid upstroke brisk and symmetric, no bruits, no thyromegaly  or adenopathy LUNGS:  Clear to auscultation bilaterally CHEST:  Unremarkable HEART:  RRR,  PMI not displaced or sustained,S1 and S2 within normal limits, no S3, no S4: no clicks, no rubs, soft 1/6 SEM LSB ABD:  Soft, nontender. BS +, no masses or bruits. No hepatomegaly, no splenomegaly EXT:  2 + pulses throughout, no edema, no cyanosis no clubbing SKIN:  Warm and dry.  No rashes NEURO:  Alert and oriented x 3. Cranial nerves II through XII intact. PSYCH:  Cognitively intact  LABORATORY DATA: Lab Results  Component Value Date   WBC 5.8 12/05/2019   HGB 13.5 12/05/2019   HCT 39.7 12/05/2019   PLT 200 12/05/2019   GLUCOSE 103 (H) 12/05/2019   CHOL 167 04/27/2018   TRIG 61 04/27/2018   HDL 71 04/27/2018   LDLCALC 84 04/27/2018   ALT 20 12/05/2019   AST 16 12/05/2019   NA 139 12/05/2019   K 5.1 12/05/2019   CL 105 12/05/2019   CREATININE 1.29 (H) 12/05/2019   BUN 23 12/05/2019   CO2 23 12/05/2019   INR 1.2 08/16/2007    Dated 06/08/17: cholesterol 181, triglycerides 102, HDL 62, LDL 99. Creatinine 1.3. Otherwise chemistries, CBC, TSH normal Dated 11/08/18: cholesterol 149, triglycerides 59, HDL 53, LDL 84. Creatinine 1.3. otherwise chemistries and TSH normal Dated 11/14/19: cholesterol 159, triglycerides 72, HDL 62, LDL 83.  Dated 11/22/20: cholesterol 174, triglycerides 91, HDL 71, LDL 85. GFR 58. CMET otherwise normal.  Ecg today shows NSR with first degree AV block. Rate 56. LAFB. LVH. Possible old septal infarct. No change from last year. I have personally reviewed and interpreted this study.   Assessment / Plan: 1. Coronary disease with prior extensive stenting of the LAD and right coronaries with first generation Cypher stents. Anginal symptoms are stable class 1 on medical therapy.  Myoview in Dec. 2015  showed no evidence of ischemia. He reports he did not tolerate Lexiscan well.  Continue DAPT with ASA and Plavix long term.  Continue focus on lifestyle modification with  increased exercise and weight control.   2. Hypertension. Blood pressure is satisfactory. On multiple medications.  Will continue Rx.   3. Hypercholesterolemia.  On Livalo now 2 mg daily and Zetia. Last LDL 85. Discussed alternative lipid lowering medication but cost is a major concern for him.  Focus on lifestyle modification.  4. Chronotropic incompetence. Noted on prior stress test and beta blocker was reduced.  HR improved.  Follow up in 6 months.

## 2020-12-11 ENCOUNTER — Other Ambulatory Visit: Payer: Self-pay | Admitting: Cardiology

## 2020-12-12 DIAGNOSIS — M1712 Unilateral primary osteoarthritis, left knee: Secondary | ICD-10-CM | POA: Diagnosis not present

## 2020-12-14 ENCOUNTER — Other Ambulatory Visit: Payer: Self-pay

## 2020-12-14 ENCOUNTER — Ambulatory Visit: Payer: PPO | Admitting: Cardiology

## 2020-12-14 ENCOUNTER — Encounter: Payer: Self-pay | Admitting: Cardiology

## 2020-12-14 VITALS — BP 140/60 | HR 54 | Ht 73.0 in | Wt 211.2 lb

## 2020-12-14 DIAGNOSIS — I10 Essential (primary) hypertension: Secondary | ICD-10-CM | POA: Diagnosis not present

## 2020-12-14 DIAGNOSIS — E7800 Pure hypercholesterolemia, unspecified: Secondary | ICD-10-CM

## 2020-12-14 DIAGNOSIS — I2510 Atherosclerotic heart disease of native coronary artery without angina pectoris: Secondary | ICD-10-CM | POA: Diagnosis not present

## 2020-12-14 MED ORDER — HYDRALAZINE HCL 50 MG PO TABS: 50.0000 mg | ORAL_TABLET | Freq: Two times a day (BID) | ORAL | 3 refills | Status: DC

## 2020-12-14 MED ORDER — CLOPIDOGREL BISULFATE 75 MG PO TABS: ORAL_TABLET | ORAL | 3 refills | Status: DC

## 2020-12-14 MED ORDER — OLMESARTAN MEDOXOMIL 40 MG PO TABS: ORAL_TABLET | ORAL | 3 refills | Status: DC

## 2020-12-19 DIAGNOSIS — M1712 Unilateral primary osteoarthritis, left knee: Secondary | ICD-10-CM | POA: Diagnosis not present

## 2020-12-26 DIAGNOSIS — M1712 Unilateral primary osteoarthritis, left knee: Secondary | ICD-10-CM | POA: Diagnosis not present

## 2021-01-16 DIAGNOSIS — M1712 Unilateral primary osteoarthritis, left knee: Secondary | ICD-10-CM | POA: Diagnosis not present

## 2021-01-23 DIAGNOSIS — M1712 Unilateral primary osteoarthritis, left knee: Secondary | ICD-10-CM | POA: Diagnosis not present

## 2021-01-23 DIAGNOSIS — M545 Low back pain, unspecified: Secondary | ICD-10-CM | POA: Diagnosis not present

## 2021-01-29 DIAGNOSIS — M5416 Radiculopathy, lumbar region: Secondary | ICD-10-CM | POA: Diagnosis not present

## 2021-02-01 DIAGNOSIS — M5416 Radiculopathy, lumbar region: Secondary | ICD-10-CM | POA: Diagnosis not present

## 2021-02-05 DIAGNOSIS — M5416 Radiculopathy, lumbar region: Secondary | ICD-10-CM | POA: Diagnosis not present

## 2021-02-12 ENCOUNTER — Other Ambulatory Visit: Payer: Self-pay | Admitting: Cardiology

## 2021-02-12 DIAGNOSIS — M5416 Radiculopathy, lumbar region: Secondary | ICD-10-CM | POA: Diagnosis not present

## 2021-02-14 DIAGNOSIS — M5416 Radiculopathy, lumbar region: Secondary | ICD-10-CM | POA: Diagnosis not present

## 2021-02-19 DIAGNOSIS — M5416 Radiculopathy, lumbar region: Secondary | ICD-10-CM | POA: Diagnosis not present

## 2021-02-21 DIAGNOSIS — M5416 Radiculopathy, lumbar region: Secondary | ICD-10-CM | POA: Diagnosis not present

## 2021-02-25 DIAGNOSIS — M1712 Unilateral primary osteoarthritis, left knee: Secondary | ICD-10-CM | POA: Diagnosis not present

## 2021-02-25 DIAGNOSIS — M545 Low back pain, unspecified: Secondary | ICD-10-CM | POA: Diagnosis not present

## 2021-02-26 ENCOUNTER — Other Ambulatory Visit: Payer: Self-pay | Admitting: Cardiology

## 2021-02-27 DIAGNOSIS — M79672 Pain in left foot: Secondary | ICD-10-CM | POA: Diagnosis not present

## 2021-02-27 DIAGNOSIS — M79671 Pain in right foot: Secondary | ICD-10-CM | POA: Diagnosis not present

## 2021-03-12 ENCOUNTER — Other Ambulatory Visit: Payer: Self-pay | Admitting: Cardiology

## 2021-03-12 DIAGNOSIS — I2510 Atherosclerotic heart disease of native coronary artery without angina pectoris: Secondary | ICD-10-CM

## 2021-03-21 ENCOUNTER — Encounter: Payer: Self-pay | Admitting: Podiatry

## 2021-03-21 ENCOUNTER — Ambulatory Visit: Payer: PPO | Admitting: Podiatry

## 2021-03-21 ENCOUNTER — Other Ambulatory Visit: Payer: Self-pay

## 2021-03-21 DIAGNOSIS — M722 Plantar fascial fibromatosis: Secondary | ICD-10-CM | POA: Diagnosis not present

## 2021-03-21 MED ORDER — TRIAMCINOLONE ACETONIDE 10 MG/ML IJ SUSP
20.0000 mg | Freq: Once | INTRAMUSCULAR | Status: AC
  Administered 2021-03-21: 20 mg

## 2021-03-21 NOTE — Patient Instructions (Signed)

## 2021-03-22 NOTE — Progress Notes (Signed)
Subjective:   Patient ID: [name], male   DOB: 76 y.o.   MRN: [id number]   HPI Patient presents stating he has developed acute discomfort in the bottom of both his heels over the last month and has tried shoe gear changes stretching and ice without relief.  Has never had this does not remember specific injury and likes to be active and currently does not smoke   Review of Systems  All other systems reviewed and are negative.      Objective:  Physical Exam Vitals and nursing note reviewed.  Constitutional:      Appearance: He is well-developed.  Pulmonary:     Effort: Pulmonary effort is normal.  Musculoskeletal:        General: Normal range of motion.  Skin:    General: Skin is warm.  Neurological:     Mental Status: He is alert.    Neurovascular status was found to be intact muscle strength adequate range of motion subtalar midtarsal joint within normal limits with patient noted to have acute discomfort in the medial fascial band of each heel with fluid buildup around the insertional point of the tendon into the calcaneus.  Patient is found to have good digital perfusion well oriented x3     Assessment:  Acute plantar fasciitis bilateral with inflammation fluid of the medial band     Plan:  H&P reviewed condition.  At this point sterile prep and did careful injection of the medial band bilateral 3 mg Kenalog 5 mg Xylocaine discussed physical therapy anti-inflammatories and shoe gear modification.  Reappoint as symptoms indicate  X-rays indicate mature spur formation bilateral heels no indication of stress fracture arthritis

## 2021-04-11 ENCOUNTER — Ambulatory Visit: Payer: PPO | Admitting: Podiatry

## 2021-04-11 ENCOUNTER — Encounter: Payer: Self-pay | Admitting: Podiatry

## 2021-04-11 ENCOUNTER — Other Ambulatory Visit: Payer: Self-pay

## 2021-04-11 DIAGNOSIS — M722 Plantar fascial fibromatosis: Secondary | ICD-10-CM

## 2021-04-11 MED ORDER — TRIAMCINOLONE ACETONIDE 10 MG/ML IJ SUSP
20.0000 mg | Freq: Once | INTRAMUSCULAR | Status: AC
  Administered 2021-04-11: 20 mg

## 2021-04-14 NOTE — Progress Notes (Signed)
Subjective:   Patient ID: [name], male   DOB: 76 y.o.   MRN: [id number]   HPI Patient presents stating he does have improvement but still has pain in both of his heels when he is more active.  States he feels like it is at least 50% better   ROS      Objective:  Physical Exam  Neurovascular status intact with patient found to have diminishment of discomfort plantar fascial bilateral still painful but improved     Assessment:  Planter fasciitis bilateral that does seem to be improving     Plan:  Did do sterile prep today and reinjected the fascia bilateral 3 mg Kenalog 5 mg Xylocaine and advised on continued support shoes anti-inflammatories as needed and shoe gear modifications.  Reappoint as symptoms indicate

## 2021-04-15 DIAGNOSIS — Z85828 Personal history of other malignant neoplasm of skin: Secondary | ICD-10-CM | POA: Diagnosis not present

## 2021-04-15 DIAGNOSIS — L720 Epidermal cyst: Secondary | ICD-10-CM | POA: Diagnosis not present

## 2021-04-15 DIAGNOSIS — D692 Other nonthrombocytopenic purpura: Secondary | ICD-10-CM | POA: Diagnosis not present

## 2021-04-15 DIAGNOSIS — L570 Actinic keratosis: Secondary | ICD-10-CM | POA: Diagnosis not present

## 2021-04-15 DIAGNOSIS — L820 Inflamed seborrheic keratosis: Secondary | ICD-10-CM | POA: Diagnosis not present

## 2021-04-15 DIAGNOSIS — L821 Other seborrheic keratosis: Secondary | ICD-10-CM | POA: Diagnosis not present

## 2021-04-23 DIAGNOSIS — N401 Enlarged prostate with lower urinary tract symptoms: Secondary | ICD-10-CM | POA: Diagnosis not present

## 2021-04-23 DIAGNOSIS — R3915 Urgency of urination: Secondary | ICD-10-CM | POA: Diagnosis not present

## 2021-04-23 DIAGNOSIS — R351 Nocturia: Secondary | ICD-10-CM | POA: Diagnosis not present

## 2021-05-06 ENCOUNTER — Ambulatory Visit: Payer: PPO | Admitting: Podiatry

## 2021-05-06 ENCOUNTER — Encounter: Payer: Self-pay | Admitting: Podiatry

## 2021-05-06 ENCOUNTER — Other Ambulatory Visit: Payer: Self-pay

## 2021-05-06 DIAGNOSIS — L600 Ingrowing nail: Secondary | ICD-10-CM | POA: Diagnosis not present

## 2021-05-06 NOTE — Progress Notes (Signed)
Subjective:   Patient ID: [name], male   DOB: 76 y.o.   MRN: [id number]   HPI Patient presents with painful ingrown toenail left big toe medial border states he is tried to trim and soak it himself    ROS      Objective:  Physical Exam  Neurovascular status intact with incurvated medial border left hallux painful when pressed making shoe gear difficult     Assessment:  Ingrown toenail deformity left hallux medial border with pain     Plan:  H&P reviewed condition recommended correction of deformity explained procedure risk and patient wants this fixed.  He signed consent form after reviewing and today I infiltrated the left hallux 60 mg like Marcaine mixture sterile prep done and using sterile instrumentation remove the medial border exposed matrix applied phenol 3 applications 30 seconds followed by alcohol lavage sterile dressing gave instructions on soaks and reappoint to recheck as needed

## 2021-05-06 NOTE — Patient Instructions (Signed)

## 2021-05-09 ENCOUNTER — Telehealth: Payer: Self-pay | Admitting: *Deleted

## 2021-05-09 NOTE — Telephone Encounter (Signed)
Patient is calling because he had an ingrown procedure on Monday,questions.  Returned the call back to patient, no answer, left vmessage for questions.

## 2021-05-14 ENCOUNTER — Other Ambulatory Visit: Payer: Self-pay | Admitting: Adult Health

## 2021-05-14 DIAGNOSIS — Z5181 Encounter for therapeutic drug level monitoring: Secondary | ICD-10-CM

## 2021-05-14 DIAGNOSIS — G40909 Epilepsy, unspecified, not intractable, without status epilepticus: Secondary | ICD-10-CM

## 2021-05-15 ENCOUNTER — Other Ambulatory Visit: Payer: Self-pay

## 2021-05-15 ENCOUNTER — Encounter: Payer: Self-pay | Admitting: Podiatry

## 2021-05-15 ENCOUNTER — Ambulatory Visit: Payer: PPO | Admitting: Podiatry

## 2021-05-15 DIAGNOSIS — M722 Plantar fascial fibromatosis: Secondary | ICD-10-CM

## 2021-05-15 DIAGNOSIS — L600 Ingrowing nail: Secondary | ICD-10-CM

## 2021-05-16 NOTE — Progress Notes (Signed)
Subjective:  ? ?Patient ID: [name], male   DOB: 76 y.o.   MRN: [id number]  ? ?HPI ?Patient presents stating I was little concerned about the condition of my nail and also my heel has been moderately tender as I been wearing flatter shoes during the recovery from a ingrown toenail ? ? ?ROS ? ? ?   ?Objective:  ?Physical Exam  ?Neurovascular status intact with slight redness of the medial side left hallux but localized with no proximal edema erythema noted and mild to moderate discomfort in the plantar aspect left heel with slight bit of dry skin on the medial side nontender ? ?   ?Assessment:  ?Planter fasciitis with moderate reoccurrence due to shoe gear modifications along with healing ingrown toenail which appears normal left  ? ?   ?Plan:  ?H&P reviewed condition recommended returning to more robust type shoes with good support and recommended anti-inflammatories as needed.  Patient will be seen back to recheck encouraged to call questions and ingrown toenail is healing well and is okay to start wearing regular shoes ?   ? ? ?

## 2021-05-21 DIAGNOSIS — R351 Nocturia: Secondary | ICD-10-CM | POA: Diagnosis not present

## 2021-05-21 DIAGNOSIS — N401 Enlarged prostate with lower urinary tract symptoms: Secondary | ICD-10-CM | POA: Diagnosis not present

## 2021-05-21 DIAGNOSIS — R3915 Urgency of urination: Secondary | ICD-10-CM | POA: Diagnosis not present

## 2021-05-30 NOTE — Progress Notes (Deleted)
? ?[name] ?Date of Birth: 04/22/1945 ?Medical Record #[id number] ? ?History of Present Illness: ?[name] is seen back today for follow up CAD.  He is status post extensive stenting of the LAD with Cypher stents in the past. He had stenting of the right coronary as well. He has a diagonal branch that was jailed by the stents. Followup stress testing has been normal last in 2012 and stress Myoview in Dec. 2015 was normal. LE arterial dopplers were normal in October 2017.  ?He has been followed in our lipid clinic. He has a history of intolerance to numerous statins. Was tried on Crestor 5 mg daily and did well on this for 5-6 months but then developed myalgias. LDL decreased to 76. He was  switched to Livalo 2 mg daily and he has tolerated  it well. Later increased to 4 mg daily and myalgias returned. Dose was reduced back to 2 mg daily and Zetia started. ? ?On follow up today he is really feeling well.   No chest pain or dyspnea. No edema or palpitations. Notes more joint problems recently with back, hip bursitis, and knee problems that has needed aspiration. Has chronic bruising.  ?  ?Current Outpatient Medications on File Prior to Visit  ?Medication Sig Dispense Refill  ? amLODipine (NORVASC) 5 MG tablet TAKE ONE TABLET TWICE DAILY 180 tablet 2  ? aspirin 81 MG tablet Take 81 mg by mouth daily.    ? carbamazepine (TEGRETOL) 200 MG tablet TAKE 1 TABLET AT BREAKFAST, LUNCH AND BEDTIME AND 1/2 TABLET WITH DINNER 315 tablet 1  ? Cholecalciferol (VITAMIN D3) 25 MCG (1000 UT) CAPS Take by mouth.    ? clopidogrel (PLAVIX) 75 MG tablet TAKE 1 TABLET(75 MG) BY MOUTH DAILY 90 tablet 3  ? ezetimibe (ZETIA) 10 MG tablet TAKE 1 TABLET (10 MG) BY MOUTH DAILY 90 tablet 3  ? hydrALAZINE (APRESOLINE) 50 MG tablet Take 1 tablet (50 mg total) by mouth 2 (two) times daily. 180 tablet 3  ? nebivolol (BYSTOLIC) 10 MG tablet TAKE ONE TABLET EACH DAY 90 tablet 3  ? olmesartan (BENICAR) 40 MG tablet TAKE ONE TABLET EACH DAY 90 tablet 3   ? Pitavastatin Calcium (LIVALO) 2 MG TABS TAKE ONE TABLET EACH DAY 90 tablet 3  ? valACYclovir (VALTREX) 500 MG tablet Take 250 mg by mouth every other day.    ? ?No current facility-administered medications on file prior to visit.  ? ? ?Allergies  ?Allergen Reactions  ? Hydrochlorothiazide Other (See Comments)  ?  Visual changes with higher dosage.  ? ? ?Past Medical History:  ?Diagnosis Date  ? Cervical spine disease   ? History of cervical spine disease  ? Coronary artery disease   ? s/p extensive stenting of the mid to distal LAD with 3 Cypher stents and past stenting of the mid RCA. He does have a diagonal branch that was jailed. He has stable angina. Negative myoview in October of 2012; Managed medically.   ? Hypercholesterolemia   ? Hypertension   ? Knee pain   ? Seizure disorder ([place])   ? Sleep apnea   ? use to wear cpap  ? Vagal reaction   ? ? ?Past Surgical History:  ?Procedure Laterality Date  ? COLONOSCOPY    ? coronary artery stent placement    ? HERNIA REPAIR    ? right groin  ? SHOULDER ARTHROSCOPY    ? right  ? VASECTOMY    ? ? ?Social History  ? ?  Tobacco Use  ?Smoking Status Former  ? Types: Cigarettes  ? Quit date: 07/03/1965  ? Years since quitting: 55.9  ?Smokeless Tobacco Never  ? ? ?Social History  ? ?Substance and Sexual Activity  ?Alcohol Use Yes  ? Alcohol/week: 0.0 standard drinks  ? Comment: 2 beers/month  ? ? ?Family History  ?Problem Relation Age of Onset  ? Heart failure Father 25  ? Hyperlipidemia Mother   ? Colon cancer Neg Hx   ? ? ?Review of Systems: ?The review of systems is per the HPI.  . All other systems were reviewed and are negative. ? ?Physical Exam: ?There were no vitals taken for this visit. ?GENERAL:  Well appearing WM in NAD ?HEENT:  PERRL, EOMI, sclera are clear. Oropharynx is clear. ?NECK:  No jugular venous distention, carotid upstroke brisk and symmetric, no bruits, no thyromegaly or adenopathy ?LUNGS:  Clear to auscultation bilaterally ?CHEST:  Unremarkable ?HEART:   RRR,  PMI not displaced or sustained,S1 and S2 within normal limits, no S3, no S4: no clicks, no rubs, soft 1/6 SEM LSB ?ABD:  Soft, nontender. BS +, no masses or bruits. No hepatomegaly, no splenomegaly ?EXT:  2 + pulses throughout, no edema, no cyanosis no clubbing ?SKIN:  Warm and dry.  No rashes ?NEURO:  Alert and oriented x 3. Cranial nerves II through XII intact. ?PSYCH:  Cognitively intact ? ?LABORATORY DATA: ?Lab Results  ?Component Value Date  ? WBC 5.8 12/05/2019  ? HGB 13.5 12/05/2019  ? HCT 39.7 12/05/2019  ? PLT 200 12/05/2019  ? GLUCOSE 103 (H) 12/05/2019  ? CHOL 167 04/27/2018  ? TRIG 61 04/27/2018  ? HDL 71 04/27/2018  ? [name] 84 04/27/2018  ? ALT 20 12/05/2019  ? AST 16 12/05/2019  ? NA 139 12/05/2019  ? K 5.1 12/05/2019  ? CL 105 12/05/2019  ? CREATININE 1.29 (H) 12/05/2019  ? BUN 23 12/05/2019  ? CO2 23 12/05/2019  ? INR 1.2 08/16/2007  ? ? ?Dated 06/08/17: cholesterol 181, triglycerides 102, HDL 62, LDL 99. Creatinine 1.3. Otherwise chemistries, CBC, TSH normal ?Dated 11/08/18: cholesterol 149, triglycerides 59, HDL 53, LDL 84. Creatinine 1.3. otherwise chemistries and TSH normal ?Dated 11/14/19: cholesterol 159, triglycerides 72, HDL 62, LDL 83.  ?Dated 11/22/20: cholesterol 174, triglycerides 91, HDL 71, LDL 85. GFR 58. CMET otherwise normal. ? ?Ecg today shows NSR with first degree AV block. Rate 56. LAFB. LVH. Possible old septal infarct. No change from last year. I have personally reviewed and interpreted this study. ? ? ?Assessment / Plan: ?1. Coronary disease with prior extensive stenting of the LAD and right coronaries with first generation Cypher stents. Anginal symptoms are stable class 1 on medical therapy.  Myoview in Dec. 2015  showed no evidence of ischemia. He reports he did not tolerate Lexiscan well.  Continue DAPT with ASA and Plavix long term.  Continue focus on lifestyle modification with increased exercise and weight control.  ? ?2. Hypertension. Blood pressure is satisfactory.  On multiple medications.  Will continue Rx.  ? ?3. Hypercholesterolemia.  On Livalo now 2 mg daily and Zetia. Last LDL 85. Discussed alternative lipid lowering medication but cost is a major concern for him.  Focus on lifestyle modification. ? ?4. Chronotropic incompetence. Noted on prior stress test and beta blocker was reduced.  HR improved. ? ?Follow up in 6 months. ?

## 2021-06-03 DIAGNOSIS — I10 Essential (primary) hypertension: Secondary | ICD-10-CM | POA: Diagnosis not present

## 2021-06-03 DIAGNOSIS — E663 Overweight: Secondary | ICD-10-CM | POA: Diagnosis not present

## 2021-06-03 DIAGNOSIS — I2510 Atherosclerotic heart disease of native coronary artery without angina pectoris: Secondary | ICD-10-CM | POA: Diagnosis not present

## 2021-06-03 DIAGNOSIS — D689 Coagulation defect, unspecified: Secondary | ICD-10-CM | POA: Diagnosis not present

## 2021-06-03 DIAGNOSIS — R079 Chest pain, unspecified: Secondary | ICD-10-CM | POA: Diagnosis not present

## 2021-06-07 ENCOUNTER — Ambulatory Visit: Payer: PPO | Admitting: Cardiology

## 2021-06-20 NOTE — Progress Notes (Signed)
? ?[name] ?Date of Birth: [birth year] ?Medical Record #[id number] ? ?History of Present Illness: ?[name] is seen back today for follow up CAD.  He is status post extensive stenting of the LAD with Cypher stents in the past. He had stenting of the right coronary as well. He has a diagonal branch that was jailed by the stents. Followup stress testing has been normal last in 2012 and stress Myoview in Dec. 2015 was normal. LE arterial dopplers were normal in October 2017.  ?He has been followed in our lipid clinic. He has a history of intolerance to numerous statins. Was tried on Crestor 5 mg daily and did well on this for 5-6 months but then developed myalgias. LDL decreased to 76. He was  switched to Livalo 2 mg daily and he has tolerated  it well. Later increased to 4 mg daily and myalgias returned. Dose was reduced back to 2 mg daily and Zetia started. ? ?On follow up today he is really feeling well.   No chest pain or dyspnea. No edema or palpitations. Still has a lot of joint problems. Asks about taking Tumeric. Also notes he had a sleep study in the remote past showing moderate sleep apnea. Tried CPAP and nasal bipap for a period of time but eventually gave it up. Thinks he sleeps [place]. Does have questions about the [place] device.  ?  ?Current Outpatient Medications on File Prior to Visit  ?Medication Sig Dispense Refill  ? amLODipine (NORVASC) 5 MG tablet TAKE ONE TABLET TWICE DAILY 180 tablet 2  ? aspirin 81 MG tablet Take 81 mg by mouth daily.    ? carbamazepine (TEGRETOL) 200 MG tablet TAKE 1 TABLET AT BREAKFAST, LUNCH AND BEDTIME AND 1/2 TABLET WITH DINNER 315 tablet 1  ? Cholecalciferol (VITAMIN D3) 25 MCG (1000 UT) CAPS Take by mouth.    ? clopidogrel (PLAVIX) 75 MG tablet TAKE 1 TABLET(75 MG) BY MOUTH DAILY 90 tablet 3  ? ezetimibe (ZETIA) 10 MG tablet TAKE 1 TABLET (10 MG) BY MOUTH DAILY 30 tablet 0  ? hydrALAZINE (APRESOLINE) 50 MG tablet Take 1 tablet (50 mg total) by mouth 2 (two) times daily. 180  tablet 3  ? nebivolol (BYSTOLIC) 10 MG tablet TAKE ONE TABLET EACH DAY 90 tablet 3  ? olmesartan (BENICAR) 40 MG tablet TAKE ONE TABLET EACH DAY 90 tablet 3  ? Pitavastatin Calcium (LIVALO) 2 MG TABS TAKE ONE TABLET EACH DAY 90 tablet 3  ? valACYclovir (VALTREX) 500 MG tablet Take 250 mg by mouth every other day.    ? ?No current facility-administered medications on file prior to visit.  ? ? ?Allergies  ?Allergen Reactions  ? Hydrochlorothiazide Other (See Comments)  ?  Visual changes with higher dosage.  ? ? ?Past Medical History:  ?Diagnosis Date  ? Cervical spine disease   ? History of cervical spine disease  ? Coronary artery disease   ? s/p extensive stenting of the mid to distal LAD with 3 Cypher stents and past stenting of the mid RCA. He does have a diagonal branch that was jailed. He has stable angina. Negative myoview in October of 2012; Managed medically.   ? Hypercholesterolemia   ? Hypertension   ? Knee pain   ? Seizure disorder ([place])   ? Sleep apnea   ? use to wear cpap  ? Vagal reaction   ? ? ?Past Surgical History:  ?Procedure Laterality Date  ? COLONOSCOPY    ? coronary artery stent placement    ?  HERNIA REPAIR    ? right groin  ? SHOULDER ARTHROSCOPY    ? right  ? VASECTOMY    ? ? ?Social History  ? ?Tobacco Use  ?Smoking Status Former  ? Types: Cigarettes  ? Quit date: 07/03/1965  ? Years since quitting: 56.0  ?Smokeless Tobacco Never  ? ? ?Social History  ? ?Substance and Sexual Activity  ?Alcohol Use Yes  ? Alcohol/week: 0.0 standard drinks  ? Comment: 2 beers/month  ? ? ?Family History  ?Problem Relation Age of Onset  ? Heart failure Father 79  ? Hyperlipidemia Mother   ? Colon cancer Neg Hx   ? ? ?Review of Systems: ?The review of systems is per the HPI.  . All other systems were reviewed and are negative. ? ?Physical Exam: ?BP 140/60 (BP Location: Left Arm)   Pulse (!) 57   Ht '6\' 1"'$  (1.854 m)   Wt 217 lb 6.4 oz (98.6 kg)   SpO2 96%   BMI 28.68 kg/m?  ?GENERAL:  Well appearing WM in  NAD ?HEENT:  PERRL, EOMI, sclera are clear. Oropharynx is clear. ?NECK:  No jugular venous distention, carotid upstroke brisk and symmetric, no bruits, no thyromegaly or adenopathy ?LUNGS:  Clear to auscultation bilaterally ?CHEST:  Unremarkable ?HEART:  RRR,  PMI not displaced or sustained,S1 and S2 within normal limits, no S3, no S4: no clicks, no rubs, soft 1/6 SEM LSB ?ABD:  Soft, nontender. BS +, no masses or bruits. No hepatomegaly, no splenomegaly ?EXT:  2 + pulses throughout, no edema, no cyanosis no clubbing ?SKIN:  Warm and dry.  No rashes ?NEURO:  Alert and oriented x 3. Cranial nerves II through XII intact. ?PSYCH:  Cognitively intact ? ?LABORATORY DATA: ?Lab Results  ?Component Value Date  ? WBC 5.8 12/05/2019  ? HGB 13.5 12/05/2019  ? HCT 39.7 12/05/2019  ? PLT 200 12/05/2019  ? GLUCOSE 103 (H) 12/05/2019  ? CHOL 167 04/27/2018  ? TRIG 61 04/27/2018  ? HDL 71 04/27/2018  ? [place] 84 04/27/2018  ? ALT 20 12/05/2019  ? AST 16 12/05/2019  ? NA 139 12/05/2019  ? K 5.1 12/05/2019  ? CL 105 12/05/2019  ? CREATININE 1.29 (H) 12/05/2019  ? BUN 23 12/05/2019  ? CO2 23 12/05/2019  ? INR 1.2 08/16/2007  ? ? ?Dated 06/08/17: cholesterol 181, triglycerides 102, HDL 62, LDL 99. Creatinine 1.3. Otherwise chemistries, CBC, TSH normal ?Dated 11/08/18: cholesterol 149, triglycerides 59, HDL 53, LDL 84. Creatinine 1.3. otherwise chemistries and TSH normal ?Dated 11/14/19: cholesterol 159, triglycerides 72, HDL 62, LDL 83.  ?Dated 11/22/20: cholesterol 174, triglycerides 91, HDL 71, LDL 85. GFR 58. CMET otherwise normal. ? ?Ecg today shows NSR with occ PVC. Rate 57. LAFB. LVH. Possible old septal infarct. No change from last year. I have personally reviewed and interpreted this study. ? ? ?Assessment / Plan: ?1. Coronary disease with remote  extensive stenting of the LAD and right coronaries with first generation Cypher stents. Anginal symptoms are stable class 1 on medical therapy.  Myoview in Dec. 2015  showed no evidence  of ischemia. He reports he did not tolerate Lexiscan well.  Continue DAPT with ASA and Plavix long term.  Continue focus on lifestyle modification with increased exercise and weight control.  ? ?2. Hypertension. Blood pressure is satisfactory. On multiple medications.  Will continue Rx.  ? ?3. Hypercholesterolemia.  On Livalo now 2 mg daily and Zetia. Last LDL 85. Discussed alternative lipid lowering medication but cost is  a major concern for him.  Focus on lifestyle modification. ? ?4. Chronotropic incompetence. Noted on prior stress test and beta blocker was reduced.  HR improved. ? ?5. History of sleep apnea. I discussed with him. If he wants to pursue therapy and consider [facility] Part he will need an up to date sleep study. He will consider. ? ?Follow up in 6 months. ?

## 2021-06-25 ENCOUNTER — Other Ambulatory Visit: Payer: Self-pay

## 2021-06-25 MED ORDER — EZETIMIBE 10 MG PO TABS: ORAL_TABLET | ORAL | 0 refills | Status: DC

## 2021-06-26 ENCOUNTER — Ambulatory Visit: Payer: PPO | Admitting: Cardiology

## 2021-06-26 ENCOUNTER — Encounter: Payer: Self-pay | Admitting: Cardiology

## 2021-06-26 VITALS — BP 140/60 | HR 57 | Ht 73.0 in | Wt 217.4 lb

## 2021-06-26 DIAGNOSIS — E7800 Pure hypercholesterolemia, unspecified: Secondary | ICD-10-CM

## 2021-06-26 DIAGNOSIS — I2510 Atherosclerotic heart disease of native coronary artery without angina pectoris: Secondary | ICD-10-CM | POA: Diagnosis not present

## 2021-06-26 DIAGNOSIS — I10 Essential (primary) hypertension: Secondary | ICD-10-CM

## 2021-06-27 ENCOUNTER — Telehealth: Payer: Self-pay | Admitting: Cardiology

## 2021-06-27 MED ORDER — EZETIMIBE 10 MG PO TABS: ORAL_TABLET | ORAL | 3 refills | Status: DC

## 2021-06-27 NOTE — Telephone Encounter (Signed)
I spoke with patient and informed him a refill for Ezetimibe (zetia) 10 MG 90 day and 3 refills was sent to the pharmacy. Patient acknowledged my statement and understood. ?

## 2021-06-27 NOTE — Telephone Encounter (Signed)
?*  STAT* If patient is at the pharmacy, call can be transferred to refill team. ? ? ?1. Which medications need to be refilled? (please list name of each medication and dose if known) ezetimibe (ZETIA) 10 MG tablet ? ?2. Which pharmacy/location (including street and city if local pharmacy) is medication to be sent to? [place], [place] - [address] ? ?3. Do they need a 30 day or 90 day supply? 90 day  with refills. ? ?Patient states he recently just had a 30 day supply sent.  He doesn't want to have to go through this trouble again of having to call for addition refills.   ?

## 2021-07-12 DIAGNOSIS — M1712 Unilateral primary osteoarthritis, left knee: Secondary | ICD-10-CM | POA: Diagnosis not present

## 2021-07-15 ENCOUNTER — Other Ambulatory Visit: Payer: Self-pay | Admitting: Orthopaedic Surgery

## 2021-07-15 DIAGNOSIS — M25562 Pain in left knee: Secondary | ICD-10-CM

## 2021-07-20 ENCOUNTER — Emergency Department (HOSPITAL_BASED_OUTPATIENT_CLINIC_OR_DEPARTMENT_OTHER): Payer: PPO

## 2021-07-20 ENCOUNTER — Other Ambulatory Visit: Payer: Self-pay

## 2021-07-20 ENCOUNTER — Emergency Department (HOSPITAL_BASED_OUTPATIENT_CLINIC_OR_DEPARTMENT_OTHER)
Admission: EM | Admit: 2021-07-20 | Discharge: 2021-07-20 | Disposition: A | Discharge status: Home / Self Care | Payer: PPO | Attending: Emergency Medicine | Admitting: Emergency Medicine

## 2021-07-20 DIAGNOSIS — W19XXXA Unspecified fall, initial encounter: Secondary | ICD-10-CM | POA: Insufficient documentation

## 2021-07-20 DIAGNOSIS — Z79899 Other long term (current) drug therapy: Secondary | ICD-10-CM | POA: Diagnosis not present

## 2021-07-20 DIAGNOSIS — S5002XA Contusion of left elbow, initial encounter: Secondary | ICD-10-CM | POA: Diagnosis not present

## 2021-07-20 DIAGNOSIS — S5012XA Contusion of left forearm, initial encounter: Secondary | ICD-10-CM

## 2021-07-20 DIAGNOSIS — I10 Essential (primary) hypertension: Secondary | ICD-10-CM | POA: Insufficient documentation

## 2021-07-20 DIAGNOSIS — Z7902 Long term (current) use of antithrombotics/antiplatelets: Secondary | ICD-10-CM | POA: Diagnosis not present

## 2021-07-20 DIAGNOSIS — M25552 Pain in left hip: Secondary | ICD-10-CM | POA: Diagnosis not present

## 2021-07-20 DIAGNOSIS — T148XXA Other injury of unspecified body region, initial encounter: Secondary | ICD-10-CM

## 2021-07-20 DIAGNOSIS — S59912A Unspecified injury of left forearm, initial encounter: Secondary | ICD-10-CM | POA: Diagnosis not present

## 2021-07-20 DIAGNOSIS — S79912A Unspecified injury of left hip, initial encounter: Secondary | ICD-10-CM | POA: Diagnosis not present

## 2021-07-20 DIAGNOSIS — M25522 Pain in left elbow: Secondary | ICD-10-CM | POA: Diagnosis not present

## 2021-07-20 DIAGNOSIS — Z7982 Long term (current) use of aspirin: Secondary | ICD-10-CM | POA: Insufficient documentation

## 2021-07-20 DIAGNOSIS — I2510 Atherosclerotic heart disease of native coronary artery without angina pectoris: Secondary | ICD-10-CM | POA: Insufficient documentation

## 2021-07-20 DIAGNOSIS — M79632 Pain in left forearm: Secondary | ICD-10-CM | POA: Diagnosis not present

## 2021-07-20 DIAGNOSIS — M7989 Other specified soft tissue disorders: Secondary | ICD-10-CM | POA: Diagnosis not present

## 2021-07-20 NOTE — ED Provider Notes (Signed)
?[facility] ?Provider Note ? ? ?CSN: [id number] ?Arrival date & time: 07/20/21  1431 ? ?  ? ?History ? ?Chief Complaint  ?Patient presents with  ? Fall  ?  mechanical  ? ? ?[name] is a 76 y.o. male. ? ? ?Fall ? ? ?Patient has a history of hypertension, coronary artery disease, hypercholesterolemia, seizure disorder, sleep apnea on Plavix.  Patient was at home when he had a mechanical fall landing on his left side.  Patient initially did not feel like the pain was too severe but it was in his left elbow forearm and thigh area.  However over the last couple hours he has noticed increasing swelling in his forearm as well as elbow.  He is also having some some discomfort and swelling around his left thigh and left hip.  He denies any headache or head injury.  No loss of consciousness.  No dizziness.  Patient was able to walk and bear weight without difficulty ? ?Home Medications ?Prior to Admission medications   ?Medication Sig Start Date End Date Taking? Authorizing Provider  ?amLODipine (NORVASC) 5 MG tablet TAKE ONE TABLET TWICE DAILY 02/26/21   [place], Peter M, MD  ?aspirin 81 MG tablet Take 81 mg by mouth daily.    [provider]  ?carbamazepine (TEGRETOL) 200 MG tablet TAKE 1 TABLET AT BREAKFAST, LUNCH AND BEDTIME AND 1/2 TABLET WITH DINNER 05/14/21   Ward Givens, NP  ?Cholecalciferol (VITAMIN D3) 25 MCG (1000 UT) CAPS Take by mouth.    [provider]  ?clopidogrel (PLAVIX) 75 MG tablet TAKE 1 TABLET(75 MG) BY MOUTH DAILY 12/14/20   [place], Peter M, MD  ?ezetimibe (ZETIA) 10 MG tablet TAKE 1 TABLET (10 MG) BY MOUTH DAILY 06/27/21   [place], Peter M, MD  ?hydrALAZINE (APRESOLINE) 50 MG tablet Take 1 tablet (50 mg total) by mouth 2 (two) times daily. 12/14/20   [place], Peter M, MD  ?nebivolol (BYSTOLIC) 10 MG tablet TAKE ONE TABLET EACH DAY 03/12/21   [place], Peter M, MD  ?olmesartan [facility]) 40 MG tablet TAKE ONE TABLET EACH DAY 12/14/20   [place], Peter M, MD   ?Pitavastatin Calcium (LIVALO) 2 MG TABS TAKE ONE TABLET EACH DAY 02/12/21   Martin[place], Peter M, MD  ?valACYclovir (VALTREX) 500 MG tablet Take 250 mg by mouth every other day.    [provider]  ?   ? ?Allergies    ?Hydrochlorothiazide   ? ?Review of Systems   ?Review of Systems  ?Constitutional:  Negative for fever.  ? ?Physical Exam ?Updated Vital Signs ?BP (!) 161/60 (BP Location: Right Arm)   Pulse (!) 51   Temp 98.1 ?F (36.7 ?C)   Resp 18   Ht 1.854 m ('6\' 1"'$ )   Wt 95.3 kg   SpO2 96%   BMI 27.71 kg/m?  ?Physical Exam ?Vitals and nursing note reviewed.  ?Constitutional:   ?   General: He is not in acute distress. ?   Appearance: He is well-developed.  ?HENT:  ?   Head: Normocephalic and atraumatic.  ?   Right Ear: External ear normal.  ?   Left Ear: External ear normal.  ?Eyes:  ?   General: No scleral icterus.    ?   Right eye: No discharge.     ?   Left eye: No discharge.  ?   Conjunctiva/sclera: Conjunctivae normal.  ?Neck:  ?   Trachea: No tracheal deviation.  ?Cardiovascular:  ?   Rate and Rhythm:  Normal rate.  ?Pulmonary:  ?   Effort: Pulmonary effort is normal. No respiratory distress.  ?   Breath sounds: No stridor.  ?Abdominal:  ?   General: There is no distension.  ?Musculoskeletal:     ?   General: No swelling or deformity.  ?   Right shoulder: No swelling, tenderness or bony tenderness.  ?   Left shoulder: No swelling, tenderness or bony tenderness.  ?   Left upper arm: No swelling or tenderness.  ?   Left elbow: Swelling present. Tenderness present.  ?   Left forearm: Swelling and tenderness present.  ?   Right wrist: No swelling, tenderness or bony tenderness.  ?   Left wrist: No swelling, tenderness or bony tenderness.  ?   Cervical back: Normal and neck supple. No swelling, tenderness or bony tenderness.  ?   Thoracic back: Normal. No swelling, tenderness or bony tenderness.  ?   Lumbar back: Normal. No swelling, tenderness or bony tenderness.  ?   Right hip: No tenderness or bony  tenderness. Normal range of motion.  ?   Left hip: Tenderness present. No bony tenderness. Normal range of motion.  ?   Right ankle: No swelling. No tenderness.  ?   Left ankle: No swelling. No tenderness.  ?   Comments: Superficial abrasions noted on the left forearm and left knee, large hematomas noted of the left forearm and left elbow olecranon bursa  ?Skin: ?   General: Skin is warm and dry.  ?   Findings: No rash.  ?Neurological:  ?   Mental Status: He is alert.  ?   Cranial Nerves: Cranial nerve deficit: no gross deficits.  ? ? ?ED Results / Procedures / Treatments   ?Labs ?(all labs ordered are listed, but only abnormal results are displayed) ?Labs Reviewed - No data to display ? ?EKG ?None ? ?Radiology ?DG Elbow Complete Left ? ?Result Date: 07/20/2021 ?CLINICAL DATA:  Fall.  Left elbow pain and swelling. EXAM: LEFT ELBOW - COMPLETE 3+ VIEW COMPARISON:  None Available. FINDINGS: There is no evidence of fracture, dislocation, or joint effusion. Mild degenerative spurring is seen involving the coronoid process of the ulna. Prominent posterior soft tissue swelling is seen overlying the olecranon process. IMPRESSION: Prominent posterior soft tissue swelling. No evidence of fracture or dislocation. Electronically Signed   By: Marlaine Hind M.D.   On: 07/20/2021 16:02  ? ?DG Forearm Left ? ?Result Date: 07/20/2021 ?CLINICAL DATA:  Fall.  Left forearm injury and pain. EXAM: LEFT FOREARM - 2 VIEW COMPARISON:  None Available. FINDINGS: There is no evidence of fracture or other focal bone lesions. Soft tissue swelling is seen along the dorsal aspect of the forearm. No evidence of radiopaque foreign body. IMPRESSION: Dorsal soft tissue swelling. No evidence of fracture or radiopaque foreign body. Electronically Signed   By: Marlaine Hind M.D.   On: 07/20/2021 16:01  ? ?DG Hip Unilat With Pelvis 2-3 Views Left ? ?Result Date: 07/20/2021 ?CLINICAL DATA:  Fall.  Left hip injury and pain. EXAM: DG HIP (WITH OR WITHOUT PELVIS)  2-3V LEFT COMPARISON:  None Available. FINDINGS: There is no evidence of hip fracture or dislocation. Mild degenerative spurring of the acetabulum is seen without joint space narrowing. IMPRESSION: No acute findings. Electronically Signed   By: Marlaine Hind M.D.   On: 07/20/2021 16:02   ? ?Procedures ?Procedures  ? ? ?Medications Ordered in ED ?Medications - No data to display ? ?ED Course/ Medical Decision  Making/ A&P ?Clinical Course as of 07/20/21 1644  ?Sat Jul 20, 2021  ?1610 X-ray images and radiology report reviewed.  No evidence of fracture or dislocation of the elbow hip or forearm [JK]  ?  ?Clinical Course User Index ?[JK] Dorie Rank, MD  ? ?                        ?Medical Decision Making ?Problems Addressed: ?Contusion of left elbow, initial encounter: acute illness or injury ?Contusion of left forearm, initial encounter: acute illness or injury ?Hematoma: acute illness or injury ? ?Amount and/or Complexity of Data Reviewed ?Radiology: ordered and independent interpretation performed. Decision-making details documented in ED Course. ? ? ?Patient with signs of contusion and swelling after mechanical fall.  Patient is on Plavix.  Likely contributing to the bruising and swelling.  No signs of fracture or dislocation.  Doubt compartment syndrome.  Will place in a sling for comfort.  Discussed ice elevation follow-up with orthopedics and warning signs precautions to return to the ED ? ? ? ? ? ? ?Final Clinical Impression(s) / ED Diagnoses ?Final diagnoses:  ?Contusion of left forearm, initial encounter  ?Hematoma  ?Contusion of left elbow, initial encounter  ? ? ?Rx / DC Orders ?ED Discharge Orders   ? ? None  ? ?  ? ? ?  ?Dorie Rank, MD ?07/20/21 1644 ? ?

## 2021-07-20 NOTE — Discharge Instructions (Signed)
Apply ice to help with the swelling.  Try to keep your arm elevated to also help reduce the swelling.  It would likely take a couple of weeks for this to completely resolve.  Follow-up with the orthopedic doctor for further evaluation if the symptoms are not improving.  Take Tylenol as needed to help with the pain and discomfort.  Return to the ED for difficulties with severe pain, numbness, weakness ?

## 2021-07-20 NOTE — ED Triage Notes (Signed)
Pt from home c/o mechanical fall 2hrs ago, landed on his left side. Reports left arm and elbow swelling, left thigh swelling. Denies hitting head, no LOC, no dizziness. Pt takes Plavix last dose this morning. Ambulatory in triage. ?

## 2021-07-24 DIAGNOSIS — S40022A Contusion of left upper arm, initial encounter: Secondary | ICD-10-CM | POA: Diagnosis not present

## 2021-07-30 ENCOUNTER — Ambulatory Visit
Admission: RE | Admit: 2021-07-30 | Discharge: 2021-07-30 | Disposition: A | Discharge status: Home / Self Care | Payer: PPO | Source: Ambulatory Visit | Attending: Orthopaedic Surgery | Admitting: Orthopaedic Surgery

## 2021-07-30 DIAGNOSIS — M25562 Pain in left knee: Secondary | ICD-10-CM | POA: Diagnosis not present

## 2021-08-02 DIAGNOSIS — S40022A Contusion of left upper arm, initial encounter: Secondary | ICD-10-CM | POA: Diagnosis not present

## 2021-08-07 DIAGNOSIS — M25562 Pain in left knee: Secondary | ICD-10-CM | POA: Diagnosis not present

## 2021-08-07 DIAGNOSIS — S40022A Contusion of left upper arm, initial encounter: Secondary | ICD-10-CM | POA: Diagnosis not present

## 2021-08-19 DIAGNOSIS — I10 Essential (primary) hypertension: Secondary | ICD-10-CM | POA: Diagnosis not present

## 2021-08-19 DIAGNOSIS — R5383 Other fatigue: Secondary | ICD-10-CM | POA: Diagnosis not present

## 2021-08-19 DIAGNOSIS — J029 Acute pharyngitis, unspecified: Secondary | ICD-10-CM | POA: Diagnosis not present

## 2021-08-19 DIAGNOSIS — R059 Cough, unspecified: Secondary | ICD-10-CM | POA: Diagnosis not present

## 2021-08-19 DIAGNOSIS — J069 Acute upper respiratory infection, unspecified: Secondary | ICD-10-CM | POA: Diagnosis not present

## 2021-08-19 DIAGNOSIS — R569 Unspecified convulsions: Secondary | ICD-10-CM | POA: Diagnosis not present

## 2021-08-19 DIAGNOSIS — R0981 Nasal congestion: Secondary | ICD-10-CM | POA: Diagnosis not present

## 2021-08-19 DIAGNOSIS — Z1152 Encounter for screening for COVID-19: Secondary | ICD-10-CM | POA: Diagnosis not present

## 2021-08-26 DIAGNOSIS — G500 Trigeminal neuralgia: Secondary | ICD-10-CM | POA: Diagnosis not present

## 2021-08-26 DIAGNOSIS — M25562 Pain in left knee: Secondary | ICD-10-CM | POA: Diagnosis not present

## 2021-08-26 DIAGNOSIS — R569 Unspecified convulsions: Secondary | ICD-10-CM | POA: Diagnosis not present

## 2021-08-26 DIAGNOSIS — Z5181 Encounter for therapeutic drug level monitoring: Secondary | ICD-10-CM | POA: Diagnosis not present

## 2021-08-26 DIAGNOSIS — S40022A Contusion of left upper arm, initial encounter: Secondary | ICD-10-CM | POA: Diagnosis not present

## 2021-08-27 ENCOUNTER — Ambulatory Visit: Payer: PPO | Admitting: Cardiology

## 2021-09-05 DIAGNOSIS — H25012 Cortical age-related cataract, left eye: Secondary | ICD-10-CM | POA: Diagnosis not present

## 2021-09-05 DIAGNOSIS — H5203 Hypermetropia, bilateral: Secondary | ICD-10-CM | POA: Diagnosis not present

## 2021-09-05 DIAGNOSIS — H2512 Age-related nuclear cataract, left eye: Secondary | ICD-10-CM | POA: Diagnosis not present

## 2021-09-05 DIAGNOSIS — H35371 Puckering of macula, right eye: Secondary | ICD-10-CM | POA: Diagnosis not present

## 2021-10-09 DIAGNOSIS — L570 Actinic keratosis: Secondary | ICD-10-CM | POA: Diagnosis not present

## 2021-10-09 DIAGNOSIS — L723 Sebaceous cyst: Secondary | ICD-10-CM | POA: Diagnosis not present

## 2021-10-09 DIAGNOSIS — L820 Inflamed seborrheic keratosis: Secondary | ICD-10-CM | POA: Diagnosis not present

## 2021-10-09 DIAGNOSIS — D692 Other nonthrombocytopenic purpura: Secondary | ICD-10-CM | POA: Diagnosis not present

## 2021-10-09 DIAGNOSIS — L821 Other seborrheic keratosis: Secondary | ICD-10-CM | POA: Diagnosis not present

## 2021-10-09 DIAGNOSIS — Z85828 Personal history of other malignant neoplasm of skin: Secondary | ICD-10-CM | POA: Diagnosis not present

## 2021-10-09 DIAGNOSIS — L309 Dermatitis, unspecified: Secondary | ICD-10-CM | POA: Diagnosis not present

## 2021-11-06 ENCOUNTER — Telehealth: Payer: Self-pay | Admitting: Cardiology

## 2021-11-06 ENCOUNTER — Telehealth: Payer: Self-pay

## 2021-11-06 NOTE — Telephone Encounter (Signed)
Patient called stating regarding your conversation on the ibuprofen the dentist recommended '400mg'$  every 6 hours.   AttnElly Modena.

## 2021-11-06 NOTE — Telephone Encounter (Signed)
Received a call from patient.Stated he has been having a clicking in his jaw.He saw dentist this morning and was advised to take Ibuprofen daily for 1 week.He wanted to ask Dr.Jordan if ok to take for 1 week.Advised Dr.Jordan is out of office this afternoon.I will speak to him tomorrow and call you back.

## 2021-11-07 NOTE — Telephone Encounter (Signed)
Spoke to patient Dr.Jordan advised ok to take Ibuprofen 400 mg every 6 hours x 1 week as prescribed by dentist.

## 2021-11-08 ENCOUNTER — Other Ambulatory Visit: Payer: Self-pay | Admitting: Adult Health

## 2021-11-08 DIAGNOSIS — G40909 Epilepsy, unspecified, not intractable, without status epilepticus: Secondary | ICD-10-CM

## 2021-11-08 DIAGNOSIS — Z5181 Encounter for therapeutic drug level monitoring: Secondary | ICD-10-CM

## 2021-11-22 ENCOUNTER — Other Ambulatory Visit: Payer: Self-pay | Admitting: Cardiology

## 2021-11-25 DIAGNOSIS — I10 Essential (primary) hypertension: Secondary | ICD-10-CM | POA: Diagnosis not present

## 2021-11-25 DIAGNOSIS — G500 Trigeminal neuralgia: Secondary | ICD-10-CM | POA: Diagnosis not present

## 2021-11-25 DIAGNOSIS — R7989 Other specified abnormal findings of blood chemistry: Secondary | ICD-10-CM | POA: Diagnosis not present

## 2021-11-25 DIAGNOSIS — E785 Hyperlipidemia, unspecified: Secondary | ICD-10-CM | POA: Diagnosis not present

## 2021-11-25 DIAGNOSIS — Z0000 Encounter for general adult medical examination without abnormal findings: Secondary | ICD-10-CM | POA: Diagnosis not present

## 2021-11-25 DIAGNOSIS — Z125 Encounter for screening for malignant neoplasm of prostate: Secondary | ICD-10-CM | POA: Diagnosis not present

## 2021-11-26 DIAGNOSIS — R82998 Other abnormal findings in urine: Secondary | ICD-10-CM | POA: Diagnosis not present

## 2021-12-02 DIAGNOSIS — Z1331 Encounter for screening for depression: Secondary | ICD-10-CM | POA: Diagnosis not present

## 2021-12-02 DIAGNOSIS — I2510 Atherosclerotic heart disease of native coronary artery without angina pectoris: Secondary | ICD-10-CM | POA: Diagnosis not present

## 2021-12-02 DIAGNOSIS — R351 Nocturia: Secondary | ICD-10-CM | POA: Diagnosis not present

## 2021-12-02 DIAGNOSIS — Z1339 Encounter for screening examination for other mental health and behavioral disorders: Secondary | ICD-10-CM | POA: Diagnosis not present

## 2021-12-02 DIAGNOSIS — E785 Hyperlipidemia, unspecified: Secondary | ICD-10-CM | POA: Diagnosis not present

## 2021-12-02 DIAGNOSIS — I10 Essential (primary) hypertension: Secondary | ICD-10-CM | POA: Diagnosis not present

## 2021-12-02 DIAGNOSIS — Z0000 Encounter for general adult medical examination without abnormal findings: Secondary | ICD-10-CM | POA: Diagnosis not present

## 2021-12-04 ENCOUNTER — Ambulatory Visit: Payer: PPO | Admitting: Adult Health

## 2021-12-04 ENCOUNTER — Telehealth: Payer: Self-pay | Admitting: Adult Health

## 2021-12-04 ENCOUNTER — Encounter: Payer: Self-pay | Admitting: Adult Health

## 2021-12-04 DIAGNOSIS — G40909 Epilepsy, unspecified, not intractable, without status epilepticus: Secondary | ICD-10-CM | POA: Diagnosis not present

## 2021-12-04 DIAGNOSIS — Z5181 Encounter for therapeutic drug level monitoring: Secondary | ICD-10-CM

## 2021-12-04 MED ORDER — CARBAMAZEPINE 200 MG PO TABS: ORAL_TABLET | ORAL | 4 refills | Status: DC

## 2021-12-04 NOTE — Progress Notes (Signed)
PATIENT: [name] DOB: [birth year]  REASON FOR VISIT: follow up HISTORY FROM: patient  Chief Complaint  Patient presents with   Follow-up    Pt in 7 alone  Pt here for seizures f/u  Pt states no seizures since 1965 Pt states no questions or concerns for this visit      HISTORY OF PRESENT ILLNESS: Today 12/04/21:  [name] is a 76 year old male with a history of seizures.  He returns today for follow-up.  He continues on carbamazepine 200 mg 1 tablet at breakfast lunch and at bedtime.  He takes a half a tablet at dinner.  Denies any seizure events.  Reports that he tolerates the medication well.  Able to complete all ADLs independently. Had Blood work through PCP- carbamazepine is 11.9. reports Na level in normal range but low normal.  Returns today for an evaluation.  12/04/20: [name] is a 76 year old male with a history of seizures.  He returns today for follow-up.  Overall he has been doing well.  Denies any seizure events.  He takes carbamazepine 200 mg 3-1/2 tablets daily.  Continues to operate a motor vehicle.  Lives at home with his spouse.  Able to complete all ADLs independently.  He returns today for an evaluation.  12/05/19 [name] is a 76 year old male with a history of seizures.  He remains on carbamazepine.  He takes 3-1/2 tablets daily.  He denies any seizure events.  He operates a Teacher, music.  He is able to complete all ADLs independently.  He returns today for an evaluation.  HISTORY [name] is a 76 year old right-handed white male with a history of seizures that have been well controlled on carbamazepine.  He takes 3.5 of the 200 mg tablets of carbamazepine daily, he tolerates this well.  He is on generic medication.  He has a lot of arthritic issues, he wishes to take CBD oil.  The patient otherwise reports no other significant medical issues that have come up since last seen  REVIEW OF SYSTEMS: Out of a complete 14 system review of symptoms, the patient  complains only of the following symptoms, and all other reviewed systems are negative.  See HPI  ALLERGIES: Allergies  Allergen Reactions   Hydrochlorothiazide Other (See Comments)    Visual changes with higher dosage.    HOME MEDICATIONS: Outpatient Medications Prior to Visit  Medication Sig Dispense Refill   amLODipine (NORVASC) 5 MG tablet TAKE ONE TABLET TWICE DAILY 180 tablet 2   aspirin 81 MG tablet Take 81 mg by mouth daily.     carbamazepine (TEGRETOL) 200 MG tablet TAKE 1 TABLET AT BREAKFAST, LUNCH AND BEDTIME AND 1/2 TABLET WITH DINNER 315 tablet 1   Cholecalciferol (VITAMIN D3) 25 MCG (1000 UT) CAPS Take by mouth.     clopidogrel (PLAVIX) 75 MG tablet TAKE 1 TABLET(75 MG) BY MOUTH DAILY 90 tablet 3   ezetimibe (ZETIA) 10 MG tablet TAKE 1 TABLET (10 MG) BY MOUTH DAILY 90 tablet 3   hydrALAZINE (APRESOLINE) 50 MG tablet Take 1 tablet (50 mg total) by mouth 2 (two) times daily. 180 tablet 3   nebivolol (BYSTOLIC) 10 MG tablet TAKE ONE TABLET EACH DAY 90 tablet 3   olmesartan (BENICAR) 40 MG tablet TAKE ONE TABLET EACH DAY 90 tablet 3   Pitavastatin Calcium (LIVALO) 2 MG TABS TAKE ONE TABLET EACH DAY 90 tablet 3   tamsulosin (FLOMAX) 0.4 MG CAPS capsule Take 0.4 mg by mouth at bedtime.  valACYclovir (VALTREX) 500 MG tablet Take 250 mg by mouth every other day.     No facility-administered medications prior to visit.    PAST MEDICAL HISTORY: Past Medical History:  Diagnosis Date   Cervical spine disease    History of cervical spine disease   Coronary artery disease    s/p extensive stenting of the mid to distal LAD with 3 Cypher stents and past stenting of the mid RCA. He does have a diagonal branch that was jailed. He has stable angina. Negative myoview in October of 2012; Managed medically.    Hypercholesterolemia    Hypertension    Knee pain    Seizure disorder (HCC)    Sleep apnea    use to wear cpap   Vagal reaction     PAST SURGICAL HISTORY: Past Surgical  History:  Procedure Laterality Date   COLONOSCOPY     coronary artery stent placement     HERNIA REPAIR     right groin   SHOULDER ARTHROSCOPY     right   VASECTOMY      FAMILY HISTORY: Family History  Problem Relation Age of Onset   Heart failure Father 54   Hyperlipidemia Mother    Colon cancer Neg Hx     SOCIAL HISTORY: Social History   Socioeconomic History   Marital status: Married    Spouse name: Not on file   Number of children: 2   Years of education: 16   Highest education level: Not on file  Occupational History   Occupation: Tree surgeon    Comment: retired  Tobacco Use   Smoking status: Former    Types: Cigarettes    Quit date: 07/03/1965    Years since quitting: 56.4   Smokeless tobacco: Never  Vaping Use   Vaping Use: Never used  Substance and Sexual Activity   Alcohol use: Yes    Comment: occ   Drug use: No   Sexual activity: Not on file  Other Topics Concern   Not on file  Social History Narrative   Lives at home w/ his wife   Patient drinks about 2-3 cups of caffeine daily.   Patient is right handed.   Social Determinants of Health   Financial Resource Strain: Not on file  Food Insecurity: Not on file  Transportation Needs: Not on file  Physical Activity: Not on file  Stress: Not on file  Social Connections: Not on file  Intimate Partner Violence: Not on file      PHYSICAL EXAM  Vitals:   12/04/21 1026  BP: (!) 151/71  Pulse: (!) 50  Weight: 212 lb (96.2 kg)  Height: '6\' 1"'$  (1.854 m)   Body mass index is 27.97 kg/m.  Generalized: Well developed, in no acute distress   Neurological examination  Mentation: Alert oriented to time, place, history taking. Follows all commands speech and language fluent Cranial nerve II-XII: Pupils were equal round reactive to light. Extraocular movements were full, visual field were full on confrontational test. Head turning and shoulder shrug  were normal and symmetric. Motor: The motor  testing reveals 5 over 5 strength of all 4 extremities. Good symmetric motor tone is noted throughout.  Sensory: Sensory testing is intact to soft touch on all 4 extremities. No evidence of extinction is noted.  Coordination: Cerebellar testing reveals good finger-nose-finger and heel-to-shin bilaterally.  Gait and station: Gait is normal.    DIAGNOSTIC DATA (LABS, IMAGING, TESTING) - I reviewed patient records, labs, notes, testing and  imaging myself where available.  Lab Results  Component Value Date   WBC 5.8 12/05/2019   HGB 13.5 12/05/2019   HCT 39.7 12/05/2019   MCV 87 12/05/2019   PLT 200 12/05/2019      Component Value Date/Time   NA 139 12/05/2019 1129   K 5.1 12/05/2019 1129   CL 105 12/05/2019 1129   CO2 23 12/05/2019 1129   GLUCOSE 103 (H) 12/05/2019 1129   GLUCOSE 109 (H) 03/19/2015 0555   BUN 23 12/05/2019 1129   CREATININE 1.29 (H) 12/05/2019 1129   CALCIUM 9.2 12/05/2019 1129   PROT 6.9 12/05/2019 1129   ALBUMIN 4.6 12/05/2019 1129   AST 16 12/05/2019 1129   ALT 20 12/05/2019 1129   ALKPHOS 83 12/05/2019 1129   BILITOT 0.3 12/05/2019 1129   GFRNONAA 54 (L) 12/05/2019 1129   GFRAA 62 12/05/2019 1129   Lab Results  Component Value Date   CHOL 167 04/27/2018   HDL 71 04/27/2018   LDLCALC 84 04/27/2018   TRIG 61 04/27/2018   CHOLHDL 2.4 04/27/2018       ASSESSMENT AND PLAN 76 y.o. year old male  has a past medical history of Cervical spine disease, Coronary artery disease, Hypercholesterolemia, Hypertension, Knee pain, Seizure disorder ([place]), Sleep apnea, and Vagal reaction. here with :  Seizures  Continue carbamazepine 200 mg  1 tablet in the morning, noon and bedtime and additional half a tablet with dinner Blood work completed through PCP Follow-up in 1 year or sooner if needed    Ward Givens, MSN, NP-C 12/04/2021, 10:31 AM [facility] [address] [place], [place] [address] [phone]

## 2021-12-04 NOTE — Telephone Encounter (Signed)
Pt is requesting a billing summary with the following information to be mailed to him so he can submit it to insurance. Verified address on file is correct.   Information needed: Provider name Date of service Description of service Patient name Amount owed

## 2021-12-14 NOTE — Progress Notes (Unsigned)
[name] Date of Birth: [birth year] Medical Record #[id number]  History of Present Illness: [name] is seen back today for follow up CAD.  He is status post extensive stenting of the LAD with Cypher stents in the past. He had stenting of the right coronary as well. He has a diagonal branch that was jailed by the stents. Followup stress testing has been normal last in 2012 and stress Myoview in Dec. 2015 was normal. LE arterial dopplers were normal in October 2017.  He has been followed in our lipid clinic. He has a history of intolerance to numerous statins. Was tried on Crestor 5 mg daily and did well on this for 5-6 months but then developed myalgias. LDL decreased to 76. He was  switched to Livalo 2 mg daily and he has tolerated  it well. Later increased to 4 mg daily and myalgias returned. Dose was reduced back to 2 mg daily and Zetia started.  On follow up today he is really feeling well.   No chest pain or dyspnea. No edema or palpitations. Still has a lot of joint problems. Asks about taking Tumeric. Also notes he had a sleep study in the remote past showing moderate sleep apnea. Tried CPAP and nasal bipap for a period of time but eventually gave it up. Thinks he sleeps [place]. Does have questions about the [place] device.    Current Outpatient Medications on File Prior to Visit  Medication Sig Dispense Refill   amLODipine (NORVASC) 5 MG tablet TAKE ONE TABLET TWICE DAILY 180 tablet 2   aspirin 81 MG tablet Take 81 mg by mouth daily.     carbamazepine (TEGRETOL) 200 MG tablet TAKE 1 TABLET AT BREAKFAST, LUNCH AND BEDTIME AND 1/2 TABLET WITH DINNER 315 tablet 4   Cholecalciferol (VITAMIN D3) 25 MCG (1000 UT) CAPS Take by mouth.     clopidogrel (PLAVIX) 75 MG tablet TAKE 1 TABLET(75 MG) BY MOUTH DAILY 90 tablet 3   ezetimibe (ZETIA) 10 MG tablet TAKE 1 TABLET (10 MG) BY MOUTH DAILY 90 tablet 3   hydrALAZINE (APRESOLINE) 50 MG tablet Take 1 tablet (50 mg total) by mouth 2 (two) times daily. 180  tablet 3   nebivolol (BYSTOLIC) 10 MG tablet TAKE ONE TABLET EACH DAY 90 tablet 3   olmesartan (BENICAR) 40 MG tablet TAKE ONE TABLET EACH DAY 90 tablet 3   Pitavastatin Calcium (LIVALO) 2 MG TABS TAKE ONE TABLET EACH DAY 90 tablet 3   tamsulosin (FLOMAX) 0.4 MG CAPS capsule Take 0.4 mg by mouth at bedtime.     valACYclovir (VALTREX) 500 MG tablet Take 250 mg by mouth every other day.     No current facility-administered medications on file prior to visit.    Allergies  Allergen Reactions   Hydrochlorothiazide Other (See Comments)    Visual changes with higher dosage.    Past Medical History:  Diagnosis Date   Cervical spine disease    History of cervical spine disease   Coronary artery disease    s/p extensive stenting of the mid to distal LAD with 3 Cypher stents and past stenting of the mid RCA. He does have a diagonal branch that was jailed. He has stable angina. Negative myoview in October of 2012; Managed medically.    Hypercholesterolemia    Hypertension    Knee pain    Seizure disorder (HCC)    Sleep apnea    use to wear cpap   Vagal reaction     Past  Surgical History:  Procedure Laterality Date   COLONOSCOPY     coronary artery stent placement     HERNIA REPAIR     right groin   SHOULDER ARTHROSCOPY     right   VASECTOMY      Social History   Tobacco Use  Smoking Status Former   Types: Cigarettes   Quit date: 07/03/1965   Years since quitting: 56.4  Smokeless Tobacco Never    Social History   Substance and Sexual Activity  Alcohol Use Yes   Comment: occ    Family History  Problem Relation Age of Onset   Heart failure Father 36   Hyperlipidemia Mother    Colon cancer Neg Hx     Review of Systems: The review of systems is per the HPI.  . All other systems were reviewed and are negative.  Physical Exam: There were no vitals taken for this visit. GENERAL:  Well appearing WM in NAD HEENT:  PERRL, EOMI, sclera are clear. Oropharynx is  clear. NECK:  No jugular venous distention, carotid upstroke brisk and symmetric, no bruits, no thyromegaly or adenopathy LUNGS:  Clear to auscultation bilaterally CHEST:  Unremarkable HEART:  RRR,  PMI not displaced or sustained,S1 and S2 within normal limits, no S3, no S4: no clicks, no rubs, soft 1/6 SEM LSB ABD:  Soft, nontender. BS +, no masses or bruits. No hepatomegaly, no splenomegaly EXT:  2 + pulses throughout, no edema, no cyanosis no clubbing SKIN:  Warm and dry.  No rashes NEURO:  Alert and oriented x 3. Cranial nerves II through XII intact. PSYCH:  Cognitively intact  LABORATORY DATA: Lab Results  Component Value Date   WBC 5.8 12/05/2019   HGB 13.5 12/05/2019   HCT 39.7 12/05/2019   PLT 200 12/05/2019   GLUCOSE 103 (H) 12/05/2019   CHOL 167 04/27/2018   TRIG 61 04/27/2018   HDL 71 04/27/2018   LDLCALC 84 04/27/2018   ALT 20 12/05/2019   AST 16 12/05/2019   NA 139 12/05/2019   K 5.1 12/05/2019   CL 105 12/05/2019   CREATININE 1.29 (H) 12/05/2019   BUN 23 12/05/2019   CO2 23 12/05/2019   INR 1.2 08/16/2007    Dated 06/08/17: cholesterol 181, triglycerides 102, HDL 62, LDL 99. Creatinine 1.3. Otherwise chemistries, CBC, TSH normal Dated 11/08/18: cholesterol 149, triglycerides 59, HDL 53, LDL 84. Creatinine 1.3. otherwise chemistries and TSH normal Dated 11/14/19: cholesterol 159, triglycerides 72, HDL 62, LDL 83.  Dated 11/22/20: cholesterol 174, triglycerides 91, HDL 71, LDL 85. GFR 58. CMET otherwise normal. Dated 11/25/21: cholesterol 172, triglycerides 90, HDL 66, LDL 88. CMET and TSH normal.  Ecg today shows NSR with occ PVC. Rate 57. LAFB. LVH. Possible old septal infarct. No change from last year. I have personally reviewed and interpreted this study.   Assessment / Plan: 1. Coronary disease with remote  extensive stenting of the LAD and right coronaries with first generation Cypher stents. Anginal symptoms are stable class 1 on medical therapy.  Myoview in  Dec. 2015  showed no evidence of ischemia. He reports he did not tolerate Lexiscan well.  Continue DAPT with ASA and Plavix long term.  Continue focus on lifestyle modification with increased exercise and weight control.   2. Hypertension. Blood pressure is satisfactory. On multiple medications.  Will continue Rx.   3. Hypercholesterolemia.  On Livalo now 2 mg daily and Zetia. Last LDL 85. Discussed alternative lipid lowering medication but cost is a  major concern for him.  Focus on lifestyle modification.  4. Chronotropic incompetence. Noted on prior stress test and beta blocker was reduced.  HR improved.  5. History of sleep apnea. I discussed with him. If he wants to pursue therapy and consider Dawna Part he will need an up to date sleep study. He will consider.  Follow up in 6 months.

## 2021-12-16 ENCOUNTER — Ambulatory Visit: Payer: PPO | Attending: Cardiology | Admitting: Cardiology

## 2021-12-16 ENCOUNTER — Encounter: Payer: Self-pay | Admitting: Cardiology

## 2021-12-16 VITALS — BP 150/60 | HR 51 | Ht 73.0 in | Wt 211.2 lb

## 2021-12-16 DIAGNOSIS — I2510 Atherosclerotic heart disease of native coronary artery without angina pectoris: Secondary | ICD-10-CM

## 2021-12-16 DIAGNOSIS — E7800 Pure hypercholesterolemia, unspecified: Secondary | ICD-10-CM | POA: Diagnosis not present

## 2021-12-16 DIAGNOSIS — I10 Essential (primary) hypertension: Secondary | ICD-10-CM | POA: Diagnosis not present

## 2021-12-28 DIAGNOSIS — Z23 Encounter for immunization: Secondary | ICD-10-CM | POA: Diagnosis not present

## 2022-01-10 DIAGNOSIS — H9202 Otalgia, left ear: Secondary | ICD-10-CM | POA: Diagnosis not present

## 2022-01-10 DIAGNOSIS — L739 Follicular disorder, unspecified: Secondary | ICD-10-CM | POA: Diagnosis not present

## 2022-02-05 ENCOUNTER — Other Ambulatory Visit: Payer: Self-pay | Admitting: Cardiology

## 2022-02-12 ENCOUNTER — Other Ambulatory Visit: Payer: Self-pay | Admitting: Cardiology

## 2022-02-19 ENCOUNTER — Other Ambulatory Visit: Payer: Self-pay | Admitting: Cardiology

## 2022-02-27 DIAGNOSIS — R051 Acute cough: Secondary | ICD-10-CM | POA: Diagnosis not present

## 2022-02-27 DIAGNOSIS — J069 Acute upper respiratory infection, unspecified: Secondary | ICD-10-CM | POA: Diagnosis not present

## 2022-02-27 DIAGNOSIS — Z1152 Encounter for screening for COVID-19: Secondary | ICD-10-CM | POA: Diagnosis not present

## 2022-02-27 DIAGNOSIS — J029 Acute pharyngitis, unspecified: Secondary | ICD-10-CM | POA: Diagnosis not present

## 2022-02-27 DIAGNOSIS — R5383 Other fatigue: Secondary | ICD-10-CM | POA: Diagnosis not present

## 2022-02-27 DIAGNOSIS — R638 Other symptoms and signs concerning food and fluid intake: Secondary | ICD-10-CM | POA: Diagnosis not present

## 2022-02-27 DIAGNOSIS — R0981 Nasal congestion: Secondary | ICD-10-CM | POA: Diagnosis not present

## 2022-03-03 DIAGNOSIS — M25511 Pain in right shoulder: Secondary | ICD-10-CM | POA: Diagnosis not present

## 2022-03-03 DIAGNOSIS — M19011 Primary osteoarthritis, right shoulder: Secondary | ICD-10-CM | POA: Diagnosis not present

## 2022-03-05 ENCOUNTER — Other Ambulatory Visit: Payer: Self-pay | Admitting: Cardiology

## 2022-03-05 DIAGNOSIS — I2510 Atherosclerotic heart disease of native coronary artery without angina pectoris: Secondary | ICD-10-CM

## 2022-03-20 DIAGNOSIS — M25511 Pain in right shoulder: Secondary | ICD-10-CM | POA: Diagnosis not present

## 2022-03-31 DIAGNOSIS — M19011 Primary osteoarthritis, right shoulder: Secondary | ICD-10-CM | POA: Diagnosis not present

## 2022-04-14 DIAGNOSIS — L738 Other specified follicular disorders: Secondary | ICD-10-CM | POA: Diagnosis not present

## 2022-04-14 DIAGNOSIS — L812 Freckles: Secondary | ICD-10-CM | POA: Diagnosis not present

## 2022-04-14 DIAGNOSIS — L570 Actinic keratosis: Secondary | ICD-10-CM | POA: Diagnosis not present

## 2022-04-14 DIAGNOSIS — L080 Pyoderma: Secondary | ICD-10-CM | POA: Diagnosis not present

## 2022-04-14 DIAGNOSIS — L821 Other seborrheic keratosis: Secondary | ICD-10-CM | POA: Diagnosis not present

## 2022-04-14 DIAGNOSIS — Z85828 Personal history of other malignant neoplasm of skin: Secondary | ICD-10-CM | POA: Diagnosis not present

## 2022-04-14 DIAGNOSIS — L820 Inflamed seborrheic keratosis: Secondary | ICD-10-CM | POA: Diagnosis not present

## 2022-04-14 DIAGNOSIS — D485 Neoplasm of uncertain behavior of skin: Secondary | ICD-10-CM | POA: Diagnosis not present

## 2022-05-26 DIAGNOSIS — R5383 Other fatigue: Secondary | ICD-10-CM | POA: Diagnosis not present

## 2022-05-26 DIAGNOSIS — J0100 Acute maxillary sinusitis, unspecified: Secondary | ICD-10-CM | POA: Diagnosis not present

## 2022-05-26 DIAGNOSIS — R519 Headache, unspecified: Secondary | ICD-10-CM | POA: Diagnosis not present

## 2022-05-26 DIAGNOSIS — R509 Fever, unspecified: Secondary | ICD-10-CM | POA: Diagnosis not present

## 2022-05-26 DIAGNOSIS — G4733 Obstructive sleep apnea (adult) (pediatric): Secondary | ICD-10-CM | POA: Diagnosis not present

## 2022-05-26 DIAGNOSIS — Z1152 Encounter for screening for COVID-19: Secondary | ICD-10-CM | POA: Diagnosis not present

## 2022-06-02 DIAGNOSIS — J328 Other chronic sinusitis: Secondary | ICD-10-CM | POA: Diagnosis not present

## 2022-06-02 DIAGNOSIS — I10 Essential (primary) hypertension: Secondary | ICD-10-CM | POA: Diagnosis not present

## 2022-06-02 DIAGNOSIS — E663 Overweight: Secondary | ICD-10-CM | POA: Diagnosis not present

## 2022-06-02 DIAGNOSIS — R351 Nocturia: Secondary | ICD-10-CM | POA: Diagnosis not present

## 2022-06-02 DIAGNOSIS — G629 Polyneuropathy, unspecified: Secondary | ICD-10-CM | POA: Diagnosis not present

## 2022-06-09 DIAGNOSIS — K219 Gastro-esophageal reflux disease without esophagitis: Secondary | ICD-10-CM | POA: Diagnosis not present

## 2022-06-09 DIAGNOSIS — J302 Other seasonal allergic rhinitis: Secondary | ICD-10-CM | POA: Diagnosis not present

## 2022-06-09 DIAGNOSIS — G4733 Obstructive sleep apnea (adult) (pediatric): Secondary | ICD-10-CM | POA: Diagnosis not present

## 2022-06-09 DIAGNOSIS — R058 Other specified cough: Secondary | ICD-10-CM | POA: Diagnosis not present

## 2022-06-09 DIAGNOSIS — J328 Other chronic sinusitis: Secondary | ICD-10-CM | POA: Diagnosis not present

## 2022-06-25 DIAGNOSIS — R351 Nocturia: Secondary | ICD-10-CM | POA: Diagnosis not present

## 2022-06-25 DIAGNOSIS — N401 Enlarged prostate with lower urinary tract symptoms: Secondary | ICD-10-CM | POA: Diagnosis not present

## 2022-07-16 ENCOUNTER — Other Ambulatory Visit: Payer: Self-pay | Admitting: Cardiology

## 2022-07-19 NOTE — Progress Notes (Unsigned)
[name] Date of Birth: Aug 12, 1945 Medical Record #[id number]  History of Present Illness: [name] is seen back today for follow up CAD.  He is status post extensive stenting of the LAD with Cypher stents in the past. He had stenting of the right coronary as well. He has a diagonal branch that was jailed by the stents. Followup stress testing has been normal last in 2012 and stress Myoview in Dec. 2015 was normal. LE arterial dopplers were normal in October 2017.  He has been followed in our lipid clinic. He has a history of intolerance to numerous statins. Was tried on Crestor 5 mg daily and did well on this for 5-6 months but then developed myalgias. LDL decreased to 76. He was  switched to Livalo 2 mg daily and he has tolerated  it well. Later increased to 4 mg daily and myalgias returned. Dose was reduced back to 2 mg daily and Zetia started.  On follow up today he is really feeling well.    No edema or palpitations. Still has a lot of joint problems. He is walking in the neighborhood and is active around his house. He does get minor angina when walking up an incline. Doesn't have to slow down. Resolves immediately when levels off. Reports BP at physical recently was 138 systolic. He was rushing to his appointment today.   Current Outpatient Medications on File Prior to Visit  Medication Sig Dispense Refill   amLODipine (NORVASC) 5 MG tablet TAKE ONE TABLET TWICE DAILY 180 tablet 2   aspirin 81 MG tablet Take 81 mg by mouth daily.     carbamazepine (TEGRETOL) 200 MG tablet TAKE 1 TABLET AT BREAKFAST, LUNCH AND BEDTIME AND 1/2 TABLET WITH DINNER 315 tablet 4   Cholecalciferol (VITAMIN D3) 25 MCG (1000 UT) CAPS Take by mouth.     clopidogrel (PLAVIX) 75 MG tablet TAKE ONE TABLET DAILY 90 tablet 3   ezetimibe (ZETIA) 10 MG tablet TAKE ONE TABLET DAILY 90 tablet 1   hydrALAZINE (APRESOLINE) 50 MG tablet TAKE ONE TABLET TWICE DAILY 180 tablet 1   LIVALO 2 MG TABS TAKE ONE TABLET EACH DAY 90  tablet 3   nebivolol (BYSTOLIC) 10 MG tablet TAKE ONE TABLET EACH DAY 90 tablet 3   olmesartan (BENICAR) 40 MG tablet TAKE ONE TABLET BY MOUTH ONCE DAILY 90 tablet 3   tamsulosin (FLOMAX) 0.4 MG CAPS capsule Take 0.4 mg by mouth at bedtime.     valACYclovir (VALTREX) 500 MG tablet Take 250 mg by mouth every other day.     No current facility-administered medications on file prior to visit.    Allergies  Allergen Reactions   Hydrochlorothiazide Other (See Comments)    Visual changes with higher dosage.    Past Medical History:  Diagnosis Date   Cervical spine disease    History of cervical spine disease   Coronary artery disease    s/p extensive stenting of the mid to distal LAD with 3 Cypher stents and past stenting of the mid RCA. He does have a diagonal branch that was jailed. He has stable angina. Negative myoview in October of 2012; Managed medically.    Hypercholesterolemia    Hypertension    Knee pain    Seizure disorder (HCC)    Sleep apnea    use to wear cpap   Vagal reaction     Past Surgical History:  Procedure Laterality Date   COLONOSCOPY     coronary artery stent placement  HERNIA REPAIR     right groin   SHOULDER ARTHROSCOPY     right   VASECTOMY      Social History   Tobacco Use  Smoking Status Former   Types: Cigarettes   Quit date: 07/03/1965   Years since quitting: 57.0  Smokeless Tobacco Never    Social History   Substance and Sexual Activity  Alcohol Use Yes   Comment: occ    Family History  Problem Relation Age of Onset   Heart failure Father 7   Hyperlipidemia Mother    Colon cancer Neg Hx     Review of Systems: The review of systems is per the HPI.  . All other systems were reviewed and are negative.  Physical Exam: There were no vitals taken for this visit. GENERAL:  Well appearing WM in NAD HEENT:  PERRL, EOMI, sclera are clear. Oropharynx is clear. NECK:  No jugular venous distention, carotid upstroke brisk and  symmetric, no bruits, no thyromegaly or adenopathy LUNGS:  Clear to auscultation bilaterally CHEST:  Unremarkable HEART:  RRR,  PMI not displaced or sustained,S1 and S2 within normal limits, no S3, no S4: no clicks, no rubs, soft 1/6 SEM LSB ABD:  Soft, nontender. BS +, no masses or bruits. No hepatomegaly, no splenomegaly EXT:  2 + pulses throughout, no edema, no cyanosis no clubbing SKIN:  Warm and dry.  No rashes NEURO:  Alert and oriented x 3. Cranial nerves II through XII intact. PSYCH:  Cognitively intact  LABORATORY DATA: Lab Results  Component Value Date   WBC 5.8 12/05/2019   HGB 13.5 12/05/2019   HCT 39.7 12/05/2019   PLT 200 12/05/2019   GLUCOSE 103 (H) 12/05/2019   CHOL 167 04/27/2018   TRIG 61 04/27/2018   HDL 71 04/27/2018   LDLCALC 84 04/27/2018   ALT 20 12/05/2019   AST 16 12/05/2019   NA 139 12/05/2019   K 5.1 12/05/2019   CL 105 12/05/2019   CREATININE 1.29 (H) 12/05/2019   BUN 23 12/05/2019   CO2 23 12/05/2019   INR 1.2 08/16/2007    Dated 06/08/17: cholesterol 181, triglycerides 102, HDL 62, LDL 99. Creatinine 1.3. Otherwise chemistries, CBC, TSH normal Dated 11/08/18: cholesterol 149, triglycerides 59, HDL 53, LDL 84. Creatinine 1.3. otherwise chemistries and TSH normal Dated 11/14/19: cholesterol 159, triglycerides 72, HDL 62, LDL 83.  Dated 11/22/20: cholesterol 174, triglycerides 91, HDL 71, LDL 85. GFR 58. CMET otherwise normal. Dated 11/25/21: cholesterol 172, triglycerides 90, HDL 66, LDL 88. CMET and TSH normal.  Ecg not done today    Assessment / Plan: 1. Coronary disease with remote  extensive stenting of the LAD and right coronaries with first generation Cypher stents. Anginal symptoms are stable class 1 on medical therapy.  Myoview in Dec. 2015  showed no evidence of ischemia. He was unable to get HR up with exercise. He reports he did not tolerate Lexiscan well.  Continue DAPT with ASA and Plavix long term.  Continue focus on lifestyle  modification with increased exercise and weight control. If he were to develop worsening angina would proceed directly to cardiac cath.l   2. Hypertension. Blood pressure is elevated today. On multiple medications. Reports readings better at PCP.  Will continue Rx.   3. Hypercholesterolemia.  On Livalo now 2 mg daily and Zetia. Last LDL 88. Discussed alternative lipid lowering medication but cost is a major concern for him.  Focus on lifestyle modification.  4. Chronotropic incompetence. Noted on prior  stress test and beta blocker was reduced.  HR improved.  5. History of sleep apnea.  If he wants to pursue therapy and consider Earnest Bailey he will need an up to date sleep study. He will consider.  Follow up in 6 months.

## 2022-07-24 ENCOUNTER — Ambulatory Visit: Payer: PPO | Attending: Cardiology | Admitting: Cardiology

## 2022-07-24 ENCOUNTER — Encounter: Payer: Self-pay | Admitting: Cardiology

## 2022-07-24 VITALS — BP 178/66 | HR 55 | Ht 73.0 in | Wt 216.0 lb

## 2022-07-24 DIAGNOSIS — E7800 Pure hypercholesterolemia, unspecified: Secondary | ICD-10-CM

## 2022-07-24 DIAGNOSIS — I2510 Atherosclerotic heart disease of native coronary artery without angina pectoris: Secondary | ICD-10-CM | POA: Diagnosis not present

## 2022-07-24 DIAGNOSIS — I10 Essential (primary) hypertension: Secondary | ICD-10-CM | POA: Diagnosis not present

## 2022-07-24 MED ORDER — HYDRALAZINE HCL 50 MG PO TABS: 50.0000 mg | ORAL_TABLET | Freq: Three times a day (TID) | ORAL | 3 refills | Status: DC

## 2022-07-24 MED ORDER — HYDRALAZINE HCL 50 MG PO TABS: 50.0000 mg | ORAL_TABLET | Freq: Three times a day (TID) | ORAL | 1 refills | Status: DC

## 2022-07-24 NOTE — Patient Instructions (Addendum)
Increase hydralazine to 50 mg three times a day

## 2022-07-29 DIAGNOSIS — R569 Unspecified convulsions: Secondary | ICD-10-CM | POA: Diagnosis not present

## 2022-07-29 DIAGNOSIS — I10 Essential (primary) hypertension: Secondary | ICD-10-CM | POA: Diagnosis not present

## 2022-07-29 DIAGNOSIS — I2510 Atherosclerotic heart disease of native coronary artery without angina pectoris: Secondary | ICD-10-CM | POA: Diagnosis not present

## 2022-07-29 DIAGNOSIS — E785 Hyperlipidemia, unspecified: Secondary | ICD-10-CM | POA: Diagnosis not present

## 2022-08-20 ENCOUNTER — Other Ambulatory Visit: Payer: Self-pay | Admitting: Cardiology

## 2022-09-04 DIAGNOSIS — I10 Essential (primary) hypertension: Secondary | ICD-10-CM | POA: Diagnosis not present

## 2022-09-04 DIAGNOSIS — E785 Hyperlipidemia, unspecified: Secondary | ICD-10-CM | POA: Diagnosis not present

## 2022-09-04 DIAGNOSIS — I2510 Atherosclerotic heart disease of native coronary artery without angina pectoris: Secondary | ICD-10-CM | POA: Diagnosis not present

## 2022-09-08 DIAGNOSIS — U071 COVID-19: Secondary | ICD-10-CM | POA: Diagnosis not present

## 2022-09-08 DIAGNOSIS — R5383 Other fatigue: Secondary | ICD-10-CM | POA: Diagnosis not present

## 2022-09-08 DIAGNOSIS — R059 Cough, unspecified: Secondary | ICD-10-CM | POA: Diagnosis not present

## 2022-09-08 DIAGNOSIS — J069 Acute upper respiratory infection, unspecified: Secondary | ICD-10-CM | POA: Diagnosis not present

## 2022-09-08 DIAGNOSIS — Z1152 Encounter for screening for COVID-19: Secondary | ICD-10-CM | POA: Diagnosis not present

## 2022-09-08 DIAGNOSIS — R0981 Nasal congestion: Secondary | ICD-10-CM | POA: Diagnosis not present

## 2022-10-03 DIAGNOSIS — N401 Enlarged prostate with lower urinary tract symptoms: Secondary | ICD-10-CM | POA: Diagnosis not present

## 2022-10-03 DIAGNOSIS — R351 Nocturia: Secondary | ICD-10-CM | POA: Diagnosis not present

## 2022-10-13 DIAGNOSIS — L821 Other seborrheic keratosis: Secondary | ICD-10-CM | POA: Diagnosis not present

## 2022-10-13 DIAGNOSIS — L438 Other lichen planus: Secondary | ICD-10-CM | POA: Diagnosis not present

## 2022-10-13 DIAGNOSIS — L812 Freckles: Secondary | ICD-10-CM | POA: Diagnosis not present

## 2022-10-13 DIAGNOSIS — L820 Inflamed seborrheic keratosis: Secondary | ICD-10-CM | POA: Diagnosis not present

## 2022-10-13 DIAGNOSIS — L570 Actinic keratosis: Secondary | ICD-10-CM | POA: Diagnosis not present

## 2022-10-13 DIAGNOSIS — Z85828 Personal history of other malignant neoplasm of skin: Secondary | ICD-10-CM | POA: Diagnosis not present

## 2022-10-13 DIAGNOSIS — D1801 Hemangioma of skin and subcutaneous tissue: Secondary | ICD-10-CM | POA: Diagnosis not present

## 2022-10-15 DIAGNOSIS — H52203 Unspecified astigmatism, bilateral: Secondary | ICD-10-CM | POA: Diagnosis not present

## 2022-10-15 DIAGNOSIS — H25012 Cortical age-related cataract, left eye: Secondary | ICD-10-CM | POA: Diagnosis not present

## 2022-10-15 DIAGNOSIS — H2512 Age-related nuclear cataract, left eye: Secondary | ICD-10-CM | POA: Diagnosis not present

## 2022-10-15 DIAGNOSIS — Z961 Presence of intraocular lens: Secondary | ICD-10-CM | POA: Diagnosis not present

## 2022-10-15 DIAGNOSIS — H35371 Puckering of macula, right eye: Secondary | ICD-10-CM | POA: Diagnosis not present

## 2022-10-31 DIAGNOSIS — M1712 Unilateral primary osteoarthritis, left knee: Secondary | ICD-10-CM | POA: Diagnosis not present

## 2022-11-12 DIAGNOSIS — M19011 Primary osteoarthritis, right shoulder: Secondary | ICD-10-CM | POA: Diagnosis not present

## 2022-11-25 DIAGNOSIS — M75111 Incomplete rotator cuff tear or rupture of right shoulder, not specified as traumatic: Secondary | ICD-10-CM | POA: Diagnosis not present

## 2022-11-28 DIAGNOSIS — G629 Polyneuropathy, unspecified: Secondary | ICD-10-CM | POA: Diagnosis not present

## 2022-11-28 DIAGNOSIS — I739 Peripheral vascular disease, unspecified: Secondary | ICD-10-CM | POA: Diagnosis not present

## 2022-11-28 DIAGNOSIS — I10 Essential (primary) hypertension: Secondary | ICD-10-CM | POA: Diagnosis not present

## 2022-11-28 DIAGNOSIS — S91202A Unspecified open wound of left great toe with damage to nail, initial encounter: Secondary | ICD-10-CM | POA: Diagnosis not present

## 2022-11-28 DIAGNOSIS — Z23 Encounter for immunization: Secondary | ICD-10-CM | POA: Diagnosis not present

## 2022-12-01 ENCOUNTER — Emergency Department (HOSPITAL_BASED_OUTPATIENT_CLINIC_OR_DEPARTMENT_OTHER): Payer: PPO

## 2022-12-01 ENCOUNTER — Other Ambulatory Visit (HOSPITAL_BASED_OUTPATIENT_CLINIC_OR_DEPARTMENT_OTHER): Payer: Self-pay | Admitting: Emergency Medicine

## 2022-12-01 ENCOUNTER — Emergency Department (HOSPITAL_BASED_OUTPATIENT_CLINIC_OR_DEPARTMENT_OTHER)
Admission: EM | Admit: 2022-12-01 | Discharge: 2022-12-01 | Disposition: A | Discharge status: Home / Self Care | Payer: PPO | Attending: Emergency Medicine | Admitting: Emergency Medicine

## 2022-12-01 ENCOUNTER — Ambulatory Visit (HOSPITAL_BASED_OUTPATIENT_CLINIC_OR_DEPARTMENT_OTHER)
Admission: RE | Admit: 2022-12-01 | Discharge: 2022-12-01 | Disposition: A | Discharge status: Home / Self Care | Payer: PPO | Source: Ambulatory Visit | Attending: Emergency Medicine | Admitting: Emergency Medicine

## 2022-12-01 ENCOUNTER — Other Ambulatory Visit: Payer: Self-pay

## 2022-12-01 DIAGNOSIS — M314 Aortic arch syndrome [Takayasu]: Secondary | ICD-10-CM

## 2022-12-01 DIAGNOSIS — R0789 Other chest pain: Secondary | ICD-10-CM | POA: Diagnosis not present

## 2022-12-01 DIAGNOSIS — N281 Cyst of kidney, acquired: Secondary | ICD-10-CM | POA: Diagnosis not present

## 2022-12-01 DIAGNOSIS — Q630 Accessory kidney: Secondary | ICD-10-CM | POA: Diagnosis not present

## 2022-12-01 DIAGNOSIS — M549 Dorsalgia, unspecified: Secondary | ICD-10-CM

## 2022-12-01 DIAGNOSIS — I10 Essential (primary) hypertension: Secondary | ICD-10-CM | POA: Insufficient documentation

## 2022-12-01 DIAGNOSIS — R932 Abnormal findings on diagnostic imaging of liver and biliary tract: Secondary | ICD-10-CM | POA: Diagnosis not present

## 2022-12-01 DIAGNOSIS — Z7982 Long term (current) use of aspirin: Secondary | ICD-10-CM | POA: Diagnosis not present

## 2022-12-01 DIAGNOSIS — M546 Pain in thoracic spine: Secondary | ICD-10-CM | POA: Diagnosis not present

## 2022-12-01 DIAGNOSIS — Z79899 Other long term (current) drug therapy: Secondary | ICD-10-CM | POA: Diagnosis not present

## 2022-12-01 DIAGNOSIS — R079 Chest pain, unspecified: Secondary | ICD-10-CM | POA: Insufficient documentation

## 2022-12-01 DIAGNOSIS — I2510 Atherosclerotic heart disease of native coronary artery without angina pectoris: Secondary | ICD-10-CM | POA: Diagnosis not present

## 2022-12-01 LAB — TROPONIN I (HIGH SENSITIVITY)
Troponin I (High Sensitivity): 28 ng/L — ABNORMAL HIGH (ref <18)
Troponin I (High Sensitivity): 21 ng/L — ABNORMAL HIGH (ref <18)

## 2022-12-01 LAB — BASIC METABOLIC PANEL
Anion gap: 9 (ref 5–15)
BUN: 26 mg/dL — ABNORMAL HIGH (ref 8–23)
CO2: 26 mmol/L (ref 22–32)
Calcium: 9.2 mg/dL (ref 8.9–10.3)
Chloride: 103 mmol/L (ref 98–111)
Creatinine, Ser: 1.3 mg/dL — ABNORMAL HIGH (ref 0.61–1.24)
GFR, Estimated: 56 mL/min — ABNORMAL LOW (ref >60)
Glucose, Bld: 103 mg/dL — ABNORMAL HIGH (ref 70–99)
Potassium: 4 mmol/L (ref 3.5–5.1)
Sodium: 138 mmol/L (ref 135–145)

## 2022-12-01 LAB — CBC
HCT: 39.3 % (ref 39.0–52.0)
Hemoglobin: 13.2 g/dL (ref 13.0–17.0)
MCH: 29.2 pg (ref 26.0–34.0)
MCHC: 33.6 g/dL (ref 30.0–36.0)
MCV: 86.9 fL (ref 80.0–100.0)
Platelets: 212 10*3/uL (ref 150–400)
RBC: 4.52 MIL/uL (ref 4.22–5.81)
RDW: 13.5 % (ref 11.5–15.5)
WBC: 7.1 10*3/uL (ref 4.0–10.5)
nRBC: 0 % (ref 0.0–0.2)

## 2022-12-01 MED ORDER — IOHEXOL 350 MG/ML SOLN
100.0000 mL | Freq: Once | INTRAVENOUS | Status: AC | PRN
Start: 2022-12-01 — End: 2022-12-01
  Administered 2022-12-01: 100 mL via INTRAVENOUS

## 2022-12-01 NOTE — ED Provider Notes (Signed)
  Physical Exam  BP (!) 193/67   Pulse (!) 49   Temp 98.3 F (36.8 C)   Resp 16   Ht 6\' 1"  (1.854 m)   Wt 95.3 kg   SpO2 97%   BMI 27.71 kg/m   Physical Exam  Procedures  Procedures  ED Course / MDM   Clinical Course as of 12/01/22 0759  [facility] Dec 01, 2022  4696 Chest x-ray interpreted by me as no acute pulmonary disease.  Awaiting radiology reading. [MB]  0545 Patient's first troponin was 28.  He does not have any old ones to compare with so feel a delta troponin would be indicated.  I updated patient and wife about plan and they are in agreement [MB]    Clinical Course User Index [MB] Terrilee Files, MD   Medical Decision Making Amount and/or Complexity of Data Reviewed Labs: ordered. Radiology: ordered.  Risk Prescription drug management.   Received in signout.  Chest pain.  However pain is improved with exertion.  Troponin initially elevated but is decreasing.  Does have some hypertension but is due for his medicines this morning.  Does have previous cardiac history but states this feels different.  Negative CTA for dissection.  Doubt pulmonary embolism.  I think patient is stable for discharge home with short-term cardiology follow-up.  Also discussed potential causes such as zoster.  Will discharge.       Benjiman Core, MD 12/01/22 [phone]

## 2022-12-01 NOTE — ED Notes (Signed)
Pt. To CT via W/C

## 2022-12-01 NOTE — ED Triage Notes (Signed)
C/o left shoulder/chest pain that started yesterday. Cardiac history. Denies any other symptoms. States pain is sharp and also dull at times.States this starts in his back and wraps around.  Just wants to r/o that this isn't his heart.

## 2022-12-01 NOTE — ED Provider Notes (Signed)
[place] EMERGENCY DEPARTMENT AT [facility] Provider Note   CSN: [id number] Arrival date & time: 12/01/22  0435     History {Add pertinent medical, surgical, social history, OB history to HPI:1} Chief Complaint  Patient presents with   Chest Pain    [name] is a 77 y.o. male.  He has a history of coronary disease and stents, seizure disorder, hypertension.  He is here with complaint of pain in his left scapula wrapping around into his left chest and left upper quadrant that is been going on since yesterday morning.  He said maximum 7 out of 10 currently a 2 out of 10.  He thought it would go away but it continues and so he wanted to come here and make sure it was not his heart.  He said his cardiac chest pain that got of the stents was some substernal chest pain with ambulation.  He has not any problems since then.  He is on aspirin and Plavix along with other medication.  He does not recall any lifting or twisting or turning to cause the pain but he does state that he has been in a recliner a lot over the last 5 days since he had his toe worked on by [employer].  No shortness of breath no nausea no dizziness no diaphoresis.  Has tried nothing for it.  Follows with cardiology Dr. [place].  The history is provided by the patient.  Chest Pain Pain location:  L chest Pain quality: aching   Pain radiates to:  L shoulder and upper back Pain severity:  Moderate Onset quality:  Gradual Duration:  18 hours Timing:  Constant Progression:  Improving Chronicity:  New Relieved by:  None tried Worsened by:  Certain positions and movement Ineffective treatments:  None tried Associated symptoms: back pain   Associated symptoms: no cough, no diaphoresis, no dizziness, no fever, no nausea, no palpitations, no shortness of breath and no vomiting   Risk factors: coronary artery disease and hypertension        Home Medications Prior to Admission medications   Medication Sig Start  Date End Date Taking? Authorizing Provider  amLODipine (NORVASC) 5 MG tablet TAKE ONE TABLET TWICE DAILY 08/20/22   [place], Peter M, MD  aspirin 81 MG tablet Take 81 mg by mouth daily.    [provider]  carbamazepine (TEGRETOL) 200 MG tablet TAKE 1 TABLET AT BREAKFAST, LUNCH AND BEDTIME AND 1/2 TABLET WITH DINNER 12/04/21   Butch Penny, NP  Cholecalciferol (VITAMIN D3) 25 MCG (1000 UT) CAPS Take by mouth.    [provider]  clopidogrel (PLAVIX) 75 MG tablet TAKE ONE TABLET DAILY 02/20/22   [place], Peter M, MD  ezetimibe (ZETIA) 10 MG tablet TAKE ONE TABLET DAILY 07/16/22   [place], Peter M, MD  GEMTESA 75 MG TABS Take 1 tablet by mouth daily. 07/22/22   [provider]  hydrALAZINE (APRESOLINE) 50 MG tablet Take 1 tablet (50 mg total) by mouth 3 (three) times daily. 07/24/22   [place], Peter M, MD  LIVALO 2 MG TABS TAKE ONE TABLET EACH DAY 02/05/22   [place], Peter M, MD  nebivolol (BYSTOLIC) 10 MG tablet TAKE ONE TABLET EACH DAY 03/07/22   [place], Peter M, MD  olmesartan [facility]) 40 MG tablet TAKE ONE TABLET BY MOUTH ONCE DAILY 03/07/22   [place], Peter M, MD  valACYclovir (VALTREX) 500 MG tablet Take 250 mg by mouth every other day.    [provider]  Allergies    Hydrochlorothiazide    Review of Systems   Review of Systems  Constitutional:  Negative for diaphoresis and fever.  Respiratory:  Negative for cough and shortness of breath.   Cardiovascular:  Positive for chest pain. Negative for palpitations.  Gastrointestinal:  Negative for nausea and vomiting.  Musculoskeletal:  Positive for back pain.  Neurological:  Negative for dizziness.    Physical Exam Updated Vital Signs BP (!) 187/75   Pulse (!) 56   Resp 13   Ht 6\' 1"  (1.854 m)   Wt 95.3 kg   SpO2 97%   BMI 27.71 kg/m  Physical Exam Vitals and nursing note reviewed.  Constitutional:      General: He is not in acute distress.    Appearance: He is well-developed.  HENT:     Head:  Normocephalic and atraumatic.  Eyes:     Conjunctiva/sclera: Conjunctivae normal.  Cardiovascular:     Rate and Rhythm: Normal rate and regular rhythm.     Heart sounds: Normal heart sounds. No murmur heard. Pulmonary:     Effort: Pulmonary effort is normal. No respiratory distress.     Breath sounds: Normal breath sounds.  Abdominal:     Palpations: Abdomen is soft.     Tenderness: There is no abdominal tenderness.  Musculoskeletal:        General: No swelling. Normal range of motion.     Cervical back: Neck supple.     Right lower leg: No edema.     Left lower leg: No edema.  Skin:    General: Skin is warm and dry.     Capillary Refill: Capillary refill takes less than 2 seconds.  Neurological:     General: No focal deficit present.     Mental Status: He is alert.     ED Results / Procedures / Treatments   Labs (all labs ordered are listed, but only abnormal results are displayed) Labs Reviewed - No data to display  EKG None  Radiology No results found.  Procedures Procedures  {Document cardiac monitor, telemetry assessment procedure when appropriate:1}  Medications Ordered in ED Medications - No data to display  ED Course/ Medical Decision Making/ A&P   {   Click here for ABCD2, HEART and other calculatorsREFRESH Note before signing :1}                              Medical Decision Making Amount and/or Complexity of Data Reviewed Labs: ordered. Radiology: ordered.   This patient complains of ***; this involves an extensive number of treatment Options and is a complaint that carries with it a high risk of complications and morbidity. The differential includes ***  I ordered, reviewed and interpreted labs, which included *** I ordered medication *** and reviewed PMP when indicated. I ordered imaging studies which included *** and I independently    visualized and interpreted imaging which showed *** Additional history obtained from *** Previous records  obtained and reviewed *** I consulted *** and discussed lab and imaging findings and discussed disposition.  Cardiac monitoring reviewed, *** Social determinants considered, *** Critical Interventions: ***  After the interventions stated above, I reevaluated the patient and found *** Admission and further testing considered, ***   {Document critical care time when appropriate:1} {Document review of labs and clinical decision tools ie heart score, Chads2Vasc2 etc:1}  {Document your independent review of radiology images, and any outside records:1} {Document  your discussion with family members, caretakers, and with consultants:1} {Document social determinants of health affecting pt's care:1} {Document your decision making why or why not admission, treatments were needed:1} Final Clinical Impression(s) / ED Diagnoses Final diagnoses:  None    Rx / DC Orders ED Discharge Orders     None

## 2022-12-03 DIAGNOSIS — E785 Hyperlipidemia, unspecified: Secondary | ICD-10-CM | POA: Diagnosis not present

## 2022-12-03 DIAGNOSIS — I2510 Atherosclerotic heart disease of native coronary artery without angina pectoris: Secondary | ICD-10-CM | POA: Diagnosis not present

## 2022-12-03 DIAGNOSIS — R351 Nocturia: Secondary | ICD-10-CM | POA: Diagnosis not present

## 2022-12-03 DIAGNOSIS — K5900 Constipation, unspecified: Secondary | ICD-10-CM | POA: Diagnosis not present

## 2022-12-03 DIAGNOSIS — R109 Unspecified abdominal pain: Secondary | ICD-10-CM | POA: Diagnosis not present

## 2022-12-03 DIAGNOSIS — B029 Zoster without complications: Secondary | ICD-10-CM | POA: Diagnosis not present

## 2022-12-03 DIAGNOSIS — I10 Essential (primary) hypertension: Secondary | ICD-10-CM | POA: Diagnosis not present

## 2022-12-08 ENCOUNTER — Telehealth: Payer: Self-pay | Admitting: Adult Health

## 2022-12-08 NOTE — Telephone Encounter (Signed)
I spoke with the patient.  He states the area wraps from his chest to his back. He is on Valtrex and was diagnosed a week ago. He does have some lesions but he states they are fluid-filled and have not scabbed over yet.  He reports minimal discomfort. He understands that his lesions should be scabbed over before his appointment with [place]. He states this appointment is just a yearly seizure follow-up and he is fine with rescheduling a little bit out.  He prefers not to do a video visit which is what I had offered in place of coming in on his appointment date 9/30. We rescheduled his appointment for Thursday, October 10 at 9:30 AM arrival 9:00.  He will be in touch if he is not better by then.  He was appreciative for the call and his questions were answered.

## 2022-12-08 NOTE — Telephone Encounter (Signed)
Pt has called to report that regarding his upcoming appointment he has had shingles for the last 6-7 days, he would like to know if he should cancel his upcoming appointment on 9-30, please advise.

## 2022-12-10 ENCOUNTER — Ambulatory Visit: Payer: PPO | Admitting: Adult Health

## 2022-12-15 ENCOUNTER — Ambulatory Visit: Payer: PPO | Admitting: Adult Health

## 2022-12-15 DIAGNOSIS — Z1212 Encounter for screening for malignant neoplasm of rectum: Secondary | ICD-10-CM | POA: Diagnosis not present

## 2022-12-15 DIAGNOSIS — I10 Essential (primary) hypertension: Secondary | ICD-10-CM | POA: Diagnosis not present

## 2022-12-15 DIAGNOSIS — R82998 Other abnormal findings in urine: Secondary | ICD-10-CM | POA: Diagnosis not present

## 2022-12-17 DIAGNOSIS — I739 Peripheral vascular disease, unspecified: Secondary | ICD-10-CM | POA: Diagnosis not present

## 2022-12-17 DIAGNOSIS — I10 Essential (primary) hypertension: Secondary | ICD-10-CM | POA: Diagnosis not present

## 2022-12-17 DIAGNOSIS — Z23 Encounter for immunization: Secondary | ICD-10-CM | POA: Diagnosis not present

## 2022-12-17 DIAGNOSIS — D689 Coagulation defect, unspecified: Secondary | ICD-10-CM | POA: Diagnosis not present

## 2022-12-17 DIAGNOSIS — K219 Gastro-esophageal reflux disease without esophagitis: Secondary | ICD-10-CM | POA: Diagnosis not present

## 2022-12-17 DIAGNOSIS — Z1331 Encounter for screening for depression: Secondary | ICD-10-CM | POA: Diagnosis not present

## 2022-12-17 DIAGNOSIS — G40909 Epilepsy, unspecified, not intractable, without status epilepticus: Secondary | ICD-10-CM | POA: Diagnosis not present

## 2022-12-17 DIAGNOSIS — I25119 Atherosclerotic heart disease of native coronary artery with unspecified angina pectoris: Secondary | ICD-10-CM | POA: Diagnosis not present

## 2022-12-17 DIAGNOSIS — B028 Zoster with other complications: Secondary | ICD-10-CM | POA: Diagnosis not present

## 2022-12-17 DIAGNOSIS — E663 Overweight: Secondary | ICD-10-CM | POA: Diagnosis not present

## 2022-12-17 DIAGNOSIS — I2510 Atherosclerotic heart disease of native coronary artery without angina pectoris: Secondary | ICD-10-CM | POA: Diagnosis not present

## 2022-12-17 DIAGNOSIS — Z0000 Encounter for general adult medical examination without abnormal findings: Secondary | ICD-10-CM | POA: Diagnosis not present

## 2022-12-17 DIAGNOSIS — E785 Hyperlipidemia, unspecified: Secondary | ICD-10-CM | POA: Diagnosis not present

## 2022-12-17 DIAGNOSIS — Z1339 Encounter for screening examination for other mental health and behavioral disorders: Secondary | ICD-10-CM | POA: Diagnosis not present

## 2022-12-22 DIAGNOSIS — M19011 Primary osteoarthritis, right shoulder: Secondary | ICD-10-CM | POA: Diagnosis not present

## 2022-12-25 ENCOUNTER — Encounter: Payer: Self-pay | Admitting: Adult Health

## 2022-12-25 ENCOUNTER — Ambulatory Visit: Payer: PPO | Admitting: Adult Health

## 2022-12-25 VITALS — BP 110/56 | HR 57 | Ht 73.0 in | Wt 206.0 lb

## 2022-12-25 DIAGNOSIS — G40909 Epilepsy, unspecified, not intractable, without status epilepticus: Secondary | ICD-10-CM | POA: Diagnosis not present

## 2022-12-25 DIAGNOSIS — Z5181 Encounter for therapeutic drug level monitoring: Secondary | ICD-10-CM

## 2022-12-25 MED ORDER — CARBAMAZEPINE 200 MG PO TABS: ORAL_TABLET | ORAL | 4 refills | Status: DC

## 2022-12-25 NOTE — Progress Notes (Signed)
PATIENT: [name] DOB: 10-29-1945  REASON FOR VISIT: follow up HISTORY FROM: patient  Chief Complaint  Patient presents with   Follow-up    Pt in 19  Pt here for seizure f/u Pt states no seizures since last office visit Pt states 4 weeks out from shingles      HISTORY OF PRESENT ILLNESS: Today 12/25/22: [name] is a 77 y.o. male with a history of Seizures. Returns today for follow-up.  He denies any seizure events.  Remains on carbamazepine 200 mg in the morning, at lunch and at bedtime.  He takes an additional half a tablet at dinner.  Overall he is doing well.  States that he has had shingles for the last 4 weeks.  He has seen his primary care.  Reports that he is still in a lot of discomfort.  Having trouble sleeping at night.   12/04/21: [name] is a 76 year old male with a history of seizures.  He returns today for follow-up.  He continues on carbamazepine 200 mg 1 tablet at breakfast lunch and at bedtime.  He takes a half a tablet at dinner.  Denies any seizure events.  Reports that he tolerates the medication well.  Able to complete all ADLs independently. Had Blood work through PCP- carbamazepine is 11.9. reports Na level in normal range but low normal.  Returns today for an evaluation.  12/04/20: [name] is a 77 year old male with a history of seizures.  He returns today for follow-up.  Overall he has been doing well.  Denies any seizure events.  He takes carbamazepine 200 mg 3-1/2 tablets daily.  Continues to operate a motor vehicle.  Lives at home with his spouse.  Able to complete all ADLs independently.  He returns today for an evaluation.  12/05/19 [name] is a 77 year old male with a history of seizures.  He remains on carbamazepine.  He takes 3-1/2 tablets daily.  He denies any seizure events.  He operates a Librarian, academic.  He is able to complete all ADLs independently.  He returns today for an evaluation.  HISTORY [name] is a 77 year old right-handed  white male with a history of seizures that have been well controlled on carbamazepine.  He takes 3.5 of the 200 mg tablets of carbamazepine daily, he tolerates this well.  He is on generic medication.  He has a lot of arthritic issues, he wishes to take CBD oil.  The patient otherwise reports no other significant medical issues that have come up since last seen  REVIEW OF SYSTEMS: Out of a complete 14 system review of symptoms, the patient complains only of the following symptoms, and all other reviewed systems are negative.  See HPI  ALLERGIES: Allergies  Allergen Reactions   Hydrochlorothiazide Other (See Comments)    Visual changes with higher dosage.    HOME MEDICATIONS: Outpatient Medications Prior to Visit  Medication Sig Dispense Refill   amLODipine (NORVASC) 5 MG tablet TAKE ONE TABLET TWICE DAILY 180 tablet 3   aspirin 81 MG tablet Take 81 mg by mouth daily.     carbamazepine (TEGRETOL) 200 MG tablet TAKE 1 TABLET AT BREAKFAST, LUNCH AND BEDTIME AND 1/2 TABLET WITH DINNER 315 tablet 4   Cholecalciferol (VITAMIN D3) 25 MCG (1000 UT) CAPS Take by mouth.     clopidogrel (PLAVIX) 75 MG tablet TAKE ONE TABLET DAILY 90 tablet 3   ezetimibe (ZETIA) 10 MG tablet TAKE ONE TABLET DAILY 90 tablet 1  GEMTESA 75 MG TABS Take 1 tablet by mouth daily.     hydrALAZINE (APRESOLINE) 50 MG tablet Take 1 tablet (50 mg total) by mouth 3 (three) times daily. 270 tablet 3   LIVALO 2 MG TABS TAKE ONE TABLET EACH DAY 90 tablet 3   nebivolol (BYSTOLIC) 10 MG tablet TAKE ONE TABLET EACH DAY 90 tablet 3   telmisartan (MICARDIS) 80 MG tablet 80 mg.     valACYclovir (VALTREX) 500 MG tablet Take 250 mg by mouth every other day.     No facility-administered medications prior to visit.    PAST MEDICAL HISTORY: Past Medical History:  Diagnosis Date   Cervical spine disease    History of cervical spine disease   Coronary artery disease    s/p extensive stenting of the mid to distal LAD with 3 Cypher  stents and past stenting of the mid RCA. He does have a diagonal branch that was jailed. He has stable angina. Negative myoview in October of 2012; Managed medically.    Hypercholesterolemia    Hypertension    Knee pain    Seizure disorder (HCC)    Sleep apnea    use to wear cpap   Vagal reaction     PAST SURGICAL HISTORY: Past Surgical History:  Procedure Laterality Date   COLONOSCOPY     coronary artery stent placement     HERNIA REPAIR     right groin   SHOULDER ARTHROSCOPY     right   VASECTOMY      FAMILY HISTORY: Family History  Problem Relation Age of Onset   Heart failure Father 90   Hyperlipidemia Mother    Colon cancer Neg Hx     SOCIAL HISTORY: Social History   Socioeconomic History   Marital status: Married    Spouse name: Not on file   Number of children: 2   Years of education: 16   Highest education level: Not on file  Occupational History   Occupation: Transport planner    Comment: retired  Tobacco Use   Smoking status: Former    Current packs/day: 0.00    Types: Cigarettes    Quit date: 07/03/1965    Years since quitting: 57.5   Smokeless tobacco: Never  Vaping Use   Vaping status: Never Used  Substance and Sexual Activity   Alcohol use: Yes    Comment: occ   Drug use: No   Sexual activity: Not on file  Other Topics Concern   Not on file  Social History Narrative   Lives at home w/ his wife   Patient drinks about 2-3 cups of caffeine daily.   Patient is right handed.   Social Determinants of Health   Financial Resource Strain: Not on file  Food Insecurity: Not on file  Transportation Needs: Not on file  Physical Activity: Not on file  Stress: Not on file  Social Connections: Unknown (07/29/2021)   Received from [facility], [facility]   Social Network    Social Network: Not on file  Intimate Partner Violence: Unknown (06/20/2021)   Received from [facility], [facility]   HITS    Physically Hurt: Not on file    Insult or  Talk Down To: Not on file    Threaten Physical Harm: Not on file    Scream or Curse: Not on file      PHYSICAL EXAM  Vitals:   12/25/22 0935  BP: (!) 110/56  Pulse: (!) 57  Weight: 206 lb (  93.4 kg)  Height: 6\' 1"  (1.854 m)    Body mass index is 27.18 kg/m.  Generalized: Well developed, in no acute distress   Neurological examination  Mentation: Alert oriented to time, place, history taking. Follows all commands speech and language fluent Cranial nerve II-XII: Pupils were equal round reactive to light. Extraocular movements were full, visual field were full on confrontational test. Head turning and shoulder shrug  were normal and symmetric. Motor: The motor testing reveals 5 over 5 strength of all 4 extremities. Good symmetric motor tone is noted throughout.  Sensory: Sensory testing is intact to soft touch on all 4 extremities. No evidence of extinction is noted.  Coordination: Cerebellar testing reveals good finger-nose-finger and heel-to-shin bilaterally.  Gait and station: Gait is normal.    DIAGNOSTIC DATA (LABS, IMAGING, TESTING) - I reviewed patient records, labs, notes, testing and imaging myself where available.  Lab Results  Component Value Date   WBC 7.1 12/01/2022   HGB 13.2 12/01/2022   HCT 39.3 12/01/2022   MCV 86.9 12/01/2022   PLT 212 12/01/2022      Component Value Date/Time   NA 138 12/01/2022 0502   NA 139 12/05/2019 1129   K 4.0 12/01/2022 0502   CL 103 12/01/2022 0502   CO2 26 12/01/2022 0502   GLUCOSE 103 (H) 12/01/2022 0502   BUN 26 (H) 12/01/2022 0502   BUN 23 12/05/2019 1129   CREATININE 1.30 (H) 12/01/2022 0502   CALCIUM 9.2 12/01/2022 0502   PROT 6.9 12/05/2019 1129   ALBUMIN 4.6 12/05/2019 1129   AST 16 12/05/2019 1129   ALT 20 12/05/2019 1129   ALKPHOS 83 12/05/2019 1129   BILITOT 0.3 12/05/2019 1129   GFRNONAA 56 (L) 12/01/2022 0502   GFRAA 62 12/05/2019 1129   Lab Results  Component Value Date   CHOL 167 04/27/2018   HDL  71 04/27/2018   LDLCALC 84 04/27/2018   TRIG 61 04/27/2018   CHOLHDL 2.4 04/27/2018       ASSESSMENT AND PLAN 77 y.o. year old male  has a past medical history of Cervical spine disease, Coronary artery disease, Hypercholesterolemia, Hypertension, Knee pain, Seizure disorder (HCC), Sleep apnea, and Vagal reaction. here with :  Seizures  Continue carbamazepine 200 mg  1 tablet in the morning, noon and bedtime and additional half a tablet with dinner Blood work completed through PCP Did advise the patient that if he continues to have significant pain from shingles he can talk to his PCP as gabapentin may be an option for him. Follow-up in 1 year or sooner if needed    Butch Penny, MSN, NP-C 12/25/2022, 8:49 AM [facility] [address], [address] [place], [place] [address] [phone]

## 2022-12-25 NOTE — Patient Instructions (Signed)
Your Plan:  Continue Carbamazepine 200 mg at breakfast, lunch and bedtime. 1/2 tablet at dinner. Blood work today If your symptoms worsen or you develop new symptoms please let us know.    Thank you for coming to see [place] at [facility]. I hope we have been able to provide you high quality care today.  You may receive a patient satisfaction survey over the next few weeks. We would appreciate your feedback and comments so that we may continue to improve ourselves and the health of our patients.

## 2022-12-26 LAB — CARBAMAZEPINE LEVEL, TOTAL: Carbamazepine (Tegretol), S: 11 ug/mL (ref 4.0–12.0)

## 2023-01-05 ENCOUNTER — Telehealth: Payer: Self-pay | Admitting: *Deleted

## 2023-01-05 NOTE — Telephone Encounter (Signed)
Transition Care Management Unsuccessful Follow-up Telephone Call  Date of discharge and from where: [facility]   12/01/2022  Attempts:  1st Attempt  Reason for unsuccessful TCM follow-up call:  No answer/busy

## 2023-01-06 ENCOUNTER — Telehealth: Payer: Self-pay | Admitting: *Deleted

## 2023-01-06 NOTE — Telephone Encounter (Signed)
Transition Care Management Unsuccessful Follow-up Telephone Call  Date of discharge and from where:  [facility]  12/01/2022  Attempts:  2nd Attempt  Reason for unsuccessful TCM follow-up call:  No answer/busy

## 2023-01-14 ENCOUNTER — Other Ambulatory Visit: Payer: Self-pay | Admitting: Cardiology

## 2023-01-21 ENCOUNTER — Telehealth: Payer: Self-pay | Admitting: Adult Health

## 2023-01-21 NOTE — Telephone Encounter (Addendum)
I spoke with the patient. He was given Gabapentin 100 mg to take TID. He started out with 1 bedtime dose for 2-3 days, then with no relief or issues, he increased to TID. After 2-3 days, he noticed he was very dizzy when he woke up. He states he might have noticed some relief of the pain but it did not go away. He believes it wasn't enough time to tell but he did not want to feel drunk. We did discuss that he could talk to PCP about maybe backing off to twice per day and see if that is a sweet spot for him with no dizziness but improvement in pain. Per Aundra Millet NP, he could discuss with primary care perhaps trying a TCA or Lyrica. He thanked me for the call. He states he was just "fishing" for a recommendation as he assumed we treated nerve pain frequently. He will discuss with his PCP and consult [place] again if needed. He is not wanting to stay on this additional medication long as he already takes about 9 other medications due to his heart condition, etc.

## 2023-01-21 NOTE — Telephone Encounter (Signed)
Pt wanted to leave a message for [name] that his family doctor prescribed him the nerve medication that was discussed at his last visit a few weeks ago. He states he has started to experience a significant amount of dizziness. He wants to know if we have any recommendations for an alternative medication. His best call back # is [phone].

## 2023-01-21 NOTE — Telephone Encounter (Signed)
Can you find out what dose of gabapentin- could certainly try a lower dose if he is not on the lowest dose. Did he find it effective? If so he could try TCA or lyrica? Although Lyrica may cause dizziness as well. He can discuss these options with PCP.

## 2023-01-21 NOTE — Progress Notes (Unsigned)
[name] Date of Birth: 1945-06-12 Medical Record #[id number]  History of Present Illness: [name] is seen back today for follow up CAD.  He is status post extensive stenting of the LAD with Cypher stents in the past. He had stenting of the right coronary as well. He has a diagonal branch that was jailed by the stents. Followup stress testing has been normal last in 2012 and stress Myoview in Dec. 2015 was normal. LE arterial dopplers were normal in October 2017.  He has been followed in our lipid clinic. He has a history of intolerance to numerous statins. Was tried on Crestor 5 mg daily and did well on this for 5-6 months but then developed myalgias. LDL decreased to 76. He was  switched to Livalo 2 mg daily and he has tolerated  it well. Later increased to 4 mg daily and myalgias returned. Dose was reduced back to 2 mg daily and Zetia started.  On follow up today he is really feeling well.    No edema or palpitations. Still has a lot of joint problems especially with his shoulders. He is walking in the neighborhood and is active around his house. He states he isn't having significant angina.   Current Outpatient Medications on File Prior to Visit  Medication Sig Dispense Refill   amLODipine (NORVASC) 5 MG tablet TAKE ONE TABLET TWICE DAILY 180 tablet 3   aspirin 81 MG tablet Take 81 mg by mouth daily.     carbamazepine (TEGRETOL) 200 MG tablet TAKE 1 TABLET AT BREAKFAST, LUNCH AND BEDTIME AND 1/2 TABLET WITH DINNER 315 tablet 4   Cholecalciferol (VITAMIN D3) 25 MCG (1000 UT) CAPS Take by mouth.     clopidogrel (PLAVIX) 75 MG tablet TAKE ONE TABLET DAILY 90 tablet 3   ezetimibe (ZETIA) 10 MG tablet TAKE ONE TABLET DAILY 90 tablet 1   GEMTESA 75 MG TABS Take 1 tablet by mouth daily.     hydrALAZINE (APRESOLINE) 50 MG tablet Take 1 tablet (50 mg total) by mouth 3 (three) times daily. 270 tablet 3   LIVALO 2 MG TABS TAKE ONE TABLET EACH DAY 90 tablet 3   nebivolol (BYSTOLIC) 10 MG tablet  TAKE ONE TABLET EACH DAY 90 tablet 3   telmisartan (MICARDIS) 80 MG tablet 80 mg.     valACYclovir (VALTREX) 500 MG tablet Take 250 mg by mouth every other day.     No current facility-administered medications on file prior to visit.    Allergies  Allergen Reactions   Hydrochlorothiazide Other (See Comments)    Visual changes with higher dosage.    Past Medical History:  Diagnosis Date   Cervical spine disease    History of cervical spine disease   Coronary artery disease    s/p extensive stenting of the mid to distal LAD with 3 Cypher stents and past stenting of the mid RCA. He does have a diagonal branch that was jailed. He has stable angina. Negative myoview in October of 2012; Managed medically.    Hypercholesterolemia    Hypertension    Knee pain    Seizure disorder (HCC)    Sleep apnea    use to wear cpap   Vagal reaction     Past Surgical History:  Procedure Laterality Date   COLONOSCOPY     coronary artery stent placement     HERNIA REPAIR     right groin   SHOULDER ARTHROSCOPY     right   VASECTOMY  Social History   Tobacco Use  Smoking Status Former   Current packs/day: 0.00   Types: Cigarettes   Quit date: 07/03/1965   Years since quitting: 57.5  Smokeless Tobacco Never    Social History   Substance and Sexual Activity  Alcohol Use Yes   Comment: occ    Family History  Problem Relation Age of Onset   Hyperlipidemia Mother    Heart failure Father 24   Colon cancer Neg Hx    Seizures Neg Hx     Review of Systems: The review of systems is per the HPI.  . All other systems were reviewed and are negative.  Physical Exam: There were no vitals taken for this visit. GENERAL:  Well appearing WM in NAD HEENT:  PERRL, EOMI, sclera are clear. Oropharynx is clear. NECK:  No jugular venous distention, carotid upstroke brisk and symmetric, no bruits, no thyromegaly or adenopathy LUNGS:  Clear to auscultation bilaterally CHEST:   Unremarkable HEART:  RRR,  PMI not displaced or sustained,S1 and S2 within normal limits, no S3, no S4: no clicks, no rubs, soft 1/6 SEM LSB ABD:  Soft, nontender. BS +, no masses or bruits. No hepatomegaly, no splenomegaly EXT:  2 + pulses throughout, no edema, no cyanosis no clubbing SKIN:  Warm and dry.  No rashes NEURO:  Alert and oriented x 3. Cranial nerves II through XII intact. PSYCH:  Cognitively intact  LABORATORY DATA: Lab Results  Component Value Date   WBC 7.1 12/01/2022   HGB 13.2 12/01/2022   HCT 39.3 12/01/2022   PLT 212 12/01/2022   GLUCOSE 103 (H) 12/01/2022   CHOL 167 04/27/2018   TRIG 61 04/27/2018   HDL 71 04/27/2018   LDLCALC 84 04/27/2018   ALT 20 12/05/2019   AST 16 12/05/2019   NA 138 12/01/2022   K 4.0 12/01/2022   CL 103 12/01/2022   CREATININE 1.30 (H) 12/01/2022   BUN 26 (H) 12/01/2022   CO2 26 12/01/2022   INR 1.2 08/16/2007    Dated 06/08/17: cholesterol 181, triglycerides 102, HDL 62, LDL 99. Creatinine 1.3. Otherwise chemistries, CBC, TSH normal Dated 11/08/18: cholesterol 149, triglycerides 59, HDL 53, LDL 84. Creatinine 1.3. otherwise chemistries and TSH normal Dated 11/14/19: cholesterol 159, triglycerides 72, HDL 62, LDL 83.  Dated 11/22/20: cholesterol 174, triglycerides 91, HDL 71, LDL 85. GFR 58. CMET otherwise normal. Dated 11/25/21: cholesterol 172, triglycerides 90, HDL 66, LDL 88. CMET and TSH normal. Dated 12/15/22: cholesterol, 190, triglycerides 79, HDL 70, LDL 104. CMET and TSH normal.   Ecg done today shows NSR rate 55. LAFB, LVH. Old septal infarct. No acute change. I have personally reviewed and interpreted this study.    Assessment / Plan: 1. Coronary disease with remote  extensive stenting of the LAD and right coronaries with first generation Cypher stents. Anginal symptoms are stable class 1 on medical therapy.  Myoview in Dec. 2015  showed no evidence of ischemia. He was unable to get HR up with exercise. He reports he did not  tolerate Lexiscan well.  Continue DAPT with ASA and Plavix long term.  Continue focus on lifestyle modification with increased exercise and weight control. If he were to develop worsening angina would proceed directly to cardiac cath.  2. Hypertension. Blood pressure is elevated today. On maximal doses of olmesartan, amlodipine, Bystolic. Intolerant of HCTZ.  I have recommended increasing hydralazine to 50 mg tid. Monitor BP at home. Sodium restriction.   3. Hypercholesterolemia.  On Livalo  now 2 mg daily and Zetia. Last LDL 88. Discussed alternative lipid lowering medication but cost is a major concern for him.  Focus on lifestyle modification.  4. Chronotropic incompetence. Noted on prior stress test and beta blocker was reduced.    5. History of sleep apnea.  If he wants to pursue therapy and consider Earnest Bailey he will need an up to date sleep study. He will consider.  Follow up in 6 months.

## 2023-01-21 NOTE — Telephone Encounter (Signed)
From last visit:   Seizures   Continue carbamazepine 200 mg  1 tablet in the morning, noon and bedtime and additional half a tablet with dinner Blood work completed through PCP Did advise the patient that if he continues to have significant pain from shingles he can talk to his PCP as gabapentin may be an option for him. Follow-up in 1 year or sooner if needed       Butch Penny, MSN, NP-C 12/25/2022, 8:49 AM [facility] [address], [address] [place], [place] [address] [phone]

## 2023-01-27 ENCOUNTER — Ambulatory Visit: Payer: PPO | Attending: Cardiology | Admitting: Cardiology

## 2023-01-27 ENCOUNTER — Encounter: Payer: Self-pay | Admitting: Cardiology

## 2023-01-27 VITALS — BP 122/28 | HR 60 | Ht 73.0 in | Wt 206.8 lb

## 2023-01-27 DIAGNOSIS — E7800 Pure hypercholesterolemia, unspecified: Secondary | ICD-10-CM | POA: Diagnosis not present

## 2023-01-27 DIAGNOSIS — I2510 Atherosclerotic heart disease of native coronary artery without angina pectoris: Secondary | ICD-10-CM | POA: Diagnosis not present

## 2023-01-27 DIAGNOSIS — I10 Essential (primary) hypertension: Secondary | ICD-10-CM | POA: Diagnosis not present

## 2023-01-27 NOTE — Patient Instructions (Signed)

## 2023-01-28 ENCOUNTER — Other Ambulatory Visit: Payer: Self-pay | Admitting: Cardiology

## 2023-01-28 DIAGNOSIS — E7800 Pure hypercholesterolemia, unspecified: Secondary | ICD-10-CM

## 2023-02-18 ENCOUNTER — Other Ambulatory Visit: Payer: Self-pay | Admitting: Cardiology

## 2023-03-04 ENCOUNTER — Other Ambulatory Visit: Payer: Self-pay | Admitting: Cardiology

## 2023-03-04 DIAGNOSIS — I2510 Atherosclerotic heart disease of native coronary artery without angina pectoris: Secondary | ICD-10-CM

## 2023-03-26 DIAGNOSIS — N401 Enlarged prostate with lower urinary tract symptoms: Secondary | ICD-10-CM | POA: Diagnosis not present

## 2023-03-26 DIAGNOSIS — R351 Nocturia: Secondary | ICD-10-CM | POA: Diagnosis not present

## 2023-03-31 DIAGNOSIS — B0229 Other postherpetic nervous system involvement: Secondary | ICD-10-CM | POA: Diagnosis not present

## 2023-03-31 DIAGNOSIS — B028 Zoster with other complications: Secondary | ICD-10-CM | POA: Diagnosis not present

## 2023-04-16 DIAGNOSIS — L570 Actinic keratosis: Secondary | ICD-10-CM | POA: Diagnosis not present

## 2023-04-16 DIAGNOSIS — Z85828 Personal history of other malignant neoplasm of skin: Secondary | ICD-10-CM | POA: Diagnosis not present

## 2023-04-16 DIAGNOSIS — D1801 Hemangioma of skin and subcutaneous tissue: Secondary | ICD-10-CM | POA: Diagnosis not present

## 2023-04-16 DIAGNOSIS — L438 Other lichen planus: Secondary | ICD-10-CM | POA: Diagnosis not present

## 2023-04-16 DIAGNOSIS — L820 Inflamed seborrheic keratosis: Secondary | ICD-10-CM | POA: Diagnosis not present

## 2023-04-16 DIAGNOSIS — L853 Xerosis cutis: Secondary | ICD-10-CM | POA: Diagnosis not present

## 2023-04-16 DIAGNOSIS — L821 Other seborrheic keratosis: Secondary | ICD-10-CM | POA: Diagnosis not present

## 2023-04-16 DIAGNOSIS — L308 Other specified dermatitis: Secondary | ICD-10-CM | POA: Diagnosis not present

## 2023-06-03 DIAGNOSIS — R0981 Nasal congestion: Secondary | ICD-10-CM | POA: Diagnosis not present

## 2023-06-03 DIAGNOSIS — Z1152 Encounter for screening for COVID-19: Secondary | ICD-10-CM | POA: Diagnosis not present

## 2023-06-03 DIAGNOSIS — R5383 Other fatigue: Secondary | ICD-10-CM | POA: Diagnosis not present

## 2023-06-03 DIAGNOSIS — R059 Cough, unspecified: Secondary | ICD-10-CM | POA: Diagnosis not present

## 2023-06-03 DIAGNOSIS — U071 COVID-19: Secondary | ICD-10-CM | POA: Diagnosis not present

## 2023-06-03 DIAGNOSIS — J029 Acute pharyngitis, unspecified: Secondary | ICD-10-CM | POA: Diagnosis not present

## 2023-06-22 DIAGNOSIS — I25119 Atherosclerotic heart disease of native coronary artery with unspecified angina pectoris: Secondary | ICD-10-CM | POA: Diagnosis not present

## 2023-06-22 DIAGNOSIS — I10 Essential (primary) hypertension: Secondary | ICD-10-CM | POA: Diagnosis not present

## 2023-06-22 DIAGNOSIS — E785 Hyperlipidemia, unspecified: Secondary | ICD-10-CM | POA: Diagnosis not present

## 2023-06-22 DIAGNOSIS — B0229 Other postherpetic nervous system involvement: Secondary | ICD-10-CM | POA: Diagnosis not present

## 2023-06-22 DIAGNOSIS — G40909 Epilepsy, unspecified, not intractable, without status epilepticus: Secondary | ICD-10-CM | POA: Diagnosis not present

## 2023-07-08 DIAGNOSIS — M25562 Pain in left knee: Secondary | ICD-10-CM | POA: Diagnosis not present

## 2023-07-15 ENCOUNTER — Other Ambulatory Visit: Payer: Self-pay | Admitting: Cardiology

## 2023-07-16 ENCOUNTER — Other Ambulatory Visit: Payer: Self-pay

## 2023-07-16 MED ORDER — EZETIMIBE 10 MG PO TABS: 10.0000 mg | ORAL_TABLET | Freq: Every day | ORAL | 1 refills | Status: DC

## 2023-07-17 DIAGNOSIS — H00014 Hordeolum externum left upper eyelid: Secondary | ICD-10-CM | POA: Diagnosis not present

## 2023-07-22 ENCOUNTER — Other Ambulatory Visit: Payer: Self-pay | Admitting: Cardiology

## 2023-07-25 NOTE — Progress Notes (Unsigned)
 [name] Date of Birth: 22-Apr-1945 Medical Record #[id number]  History of Present Illness: [name] is seen back today for follow up CAD.  He is status post extensive stenting of the LAD with Cypher stents in the past. He had stenting of the right coronary as well. He has a diagonal branch that was jailed by the stents. Followup stress testing has been normal last in 2012 and stress Myoview  in Dec. 2015 was normal. LE arterial dopplers were normal in October 2017.  He has been followed in our lipid clinic. He has a history of intolerance to numerous statins. Was tried on Crestor  5 mg daily and did well on this for 5-6 months but then developed myalgias. LDL decreased to 76. He was  switched to Livalo  2 mg daily and he has tolerated  it well. Later increased to 4 mg daily and myalgias returned. Dose was reduced back to 2 mg daily and Zetia  started.  On follow up today he is doing OK. He did present to the ED in September with chest pain. Later broke out in typical rash c/w Shingles. Did see PCP and was treated with antiviral therapy and then gabapentin for neuropathy.  Still having neuropathic pain. No other angina. Reports he may need shoulder replacement.  Current Outpatient Medications on File Prior to Visit  Medication Sig Dispense Refill   amLODipine  (NORVASC ) 5 MG tablet TAKE ONE TABLET TWICE DAILY 180 tablet 3   aspirin 81 MG tablet Take 81 mg by mouth daily.     carbamazepine  (TEGRETOL ) 200 MG tablet TAKE 1 TABLET AT BREAKFAST, LUNCH AND BEDTIME AND 1/2 TABLET WITH DINNER 315 tablet 4   Cholecalciferol (VITAMIN D3) 25 MCG (1000 UT) CAPS Take by mouth.     clopidogrel  (PLAVIX ) 75 MG tablet TAKE ONE TABLET DAILY 90 tablet 3   ezetimibe  (ZETIA ) 10 MG tablet Take 1 tablet (10 mg total) by mouth daily. 90 tablet 1   GEMTESA 75 MG TABS Take 1 tablet by mouth daily.     hydrALAZINE  (APRESOLINE ) 50 MG tablet TAKE ONE TABLET BY MOUTH THREE TIMES DAILY 270 tablet 3   LIVALO  2 MG TABS TAKE ONE  TABLET EACH DAY 90 tablet 3   nebivolol  (BYSTOLIC ) 10 MG tablet TAKE ONE TABLET EVERY DAY 90 tablet 3   penicillin v potassium (VEETID) 500 MG tablet Take 500 mg by mouth 4 (four) times daily.     telmisartan (MICARDIS) 80 MG tablet 80 mg.     No current facility-administered medications on file prior to visit.    Allergies  Allergen Reactions   Hydrochlorothiazide Other (See Comments)    Visual changes with higher dosage.    Past Medical History:  Diagnosis Date   Cervical spine disease    History of cervical spine disease   Coronary artery disease    s/p extensive stenting of the mid to distal LAD with 3 Cypher stents and past stenting of the mid RCA. He does have a diagonal branch that was jailed. He has stable angina. Negative myoview  in October of 2012; Managed medically.    Hypercholesterolemia    Hypertension    Knee pain    Seizure disorder (HCC)    Sleep apnea    use to wear cpap   Vagal reaction     Past Surgical History:  Procedure Laterality Date   COLONOSCOPY     coronary artery stent placement     HERNIA REPAIR     right groin  SHOULDER ARTHROSCOPY     right   VASECTOMY      Social History   Tobacco Use  Smoking Status Former   Current packs/day: 0.00   Types: Cigarettes   Quit date: 07/03/1965   Years since quitting: 58.0  Smokeless Tobacco Never    Social History   Substance and Sexual Activity  Alcohol  Use Yes   Comment: occ    Family History  Problem Relation Age of Onset   Hyperlipidemia Mother    Heart failure Father 71   Colon cancer Neg Hx    Seizures Neg Hx     Review of Systems: The review of systems is per the HPI.  . All other systems were reviewed and are negative.  Physical Exam: There were no vitals taken for this visit. GENERAL:  Well appearing WM in NAD HEENT:  PERRL, EOMI, sclera are clear. Oropharynx is clear. NECK:  No jugular venous distention, carotid upstroke brisk and symmetric, no bruits, no thyromegaly or  adenopathy LUNGS:  Clear to auscultation bilaterally CHEST:  Unremarkable HEART:  RRR,  PMI not displaced or sustained,S1 and S2 within normal limits, no S3, no S4: no clicks, no rubs, soft 1/6 SEM LSB ABD:  Soft, nontender. BS +, no masses or bruits. No hepatomegaly, no splenomegaly EXT:  2 + pulses throughout, no edema, no cyanosis no clubbing SKIN:  Warm and dry.  No rashes NEURO:  Alert and oriented x 3. Cranial nerves II through XII intact. PSYCH:  Cognitively intact  LABORATORY DATA: Lab Results  Component Value Date   WBC 7.1 12/01/2022   HGB 13.2 12/01/2022   HCT 39.3 12/01/2022   PLT 212 12/01/2022   GLUCOSE 103 (H) 12/01/2022   CHOL 167 04/27/2018   TRIG 61 04/27/2018   HDL 71 04/27/2018   LDLCALC 84 04/27/2018   ALT 20 12/05/2019   AST 16 12/05/2019   NA 138 12/01/2022   K 4.0 12/01/2022   CL 103 12/01/2022   CREATININE 1.30 (H) 12/01/2022   BUN 26 (H) 12/01/2022   CO2 26 12/01/2022   INR 1.2 08/16/2007    Dated 06/08/17: cholesterol 181, triglycerides 102, HDL 62, LDL 99. Creatinine 1.3. Otherwise chemistries, CBC, TSH normal Dated 11/08/18: cholesterol 149, triglycerides 59, HDL 53, LDL 84. Creatinine 1.3. otherwise chemistries and TSH normal Dated 11/14/19: cholesterol 159, triglycerides 72, HDL 62, LDL 83.  Dated 11/22/20: cholesterol 174, triglycerides 91, HDL 71, LDL 85. GFR 58. CMET otherwise normal. Dated 11/25/21: cholesterol 172, triglycerides 90, HDL 66, LDL 88. CMET and TSH normal. Dated 12/15/22: cholesterol, 190, triglycerides 79, HDL 70, LDL 104. CMET and TSH normal.    Assessment / Plan: 1. Coronary disease with remote  extensive stenting of the LAD and right coronaries with first generation Cypher stents. Anginal symptoms are stable class 1 on medical therapy.  Myoview  in Dec. 2015  showed no evidence of ischemia. He was unable to get HR up with exercise. He reports he did not tolerate Lexiscan  well.  Continue DAPT with ASA and Plavix  long term.    2.  Hypertension. Blood pressure is well controlled today. On maximal doses of olmesartan , amlodipine , Bystolic . Intolerant of HCTZ.    3. Hypercholesterolemia.  On Livalo  now 2 mg daily and Zetia . Last LDL 104. Discussed alternative lipid lowering medication but cost is a major concern for him.  Focus on lifestyle modification.  4. Chronotropic incompetence. Noted on prior stress test and beta blocker was reduced.    5. History of  sleep apnea.   6. Shingles.   Follow up in 6 months.

## 2023-07-28 ENCOUNTER — Encounter: Payer: Self-pay | Admitting: Cardiology

## 2023-07-28 ENCOUNTER — Ambulatory Visit: Payer: PPO | Attending: Cardiology | Admitting: Cardiology

## 2023-07-28 VITALS — BP 104/50 | HR 51 | Ht 73.0 in | Wt 212.6 lb

## 2023-07-28 DIAGNOSIS — I2510 Atherosclerotic heart disease of native coronary artery without angina pectoris: Secondary | ICD-10-CM | POA: Diagnosis not present

## 2023-07-28 DIAGNOSIS — E7800 Pure hypercholesterolemia, unspecified: Secondary | ICD-10-CM | POA: Diagnosis not present

## 2023-07-28 DIAGNOSIS — I10 Essential (primary) hypertension: Secondary | ICD-10-CM

## 2023-07-28 NOTE — Patient Instructions (Signed)

## 2023-08-06 DIAGNOSIS — H0014 Chalazion left upper eyelid: Secondary | ICD-10-CM | POA: Diagnosis not present

## 2023-08-12 ENCOUNTER — Other Ambulatory Visit: Payer: Self-pay | Admitting: Cardiology

## 2023-10-04 IMAGING — DX DG FOREARM 2V*L*
2 series · 2 of 2 positions shown · non-contrast
Comparison: None Available.

CLINICAL DATA: Fall.  Left forearm injury and pain.

EXAM:
LEFT FOREARM - 2 VIEW

[forearm ap]
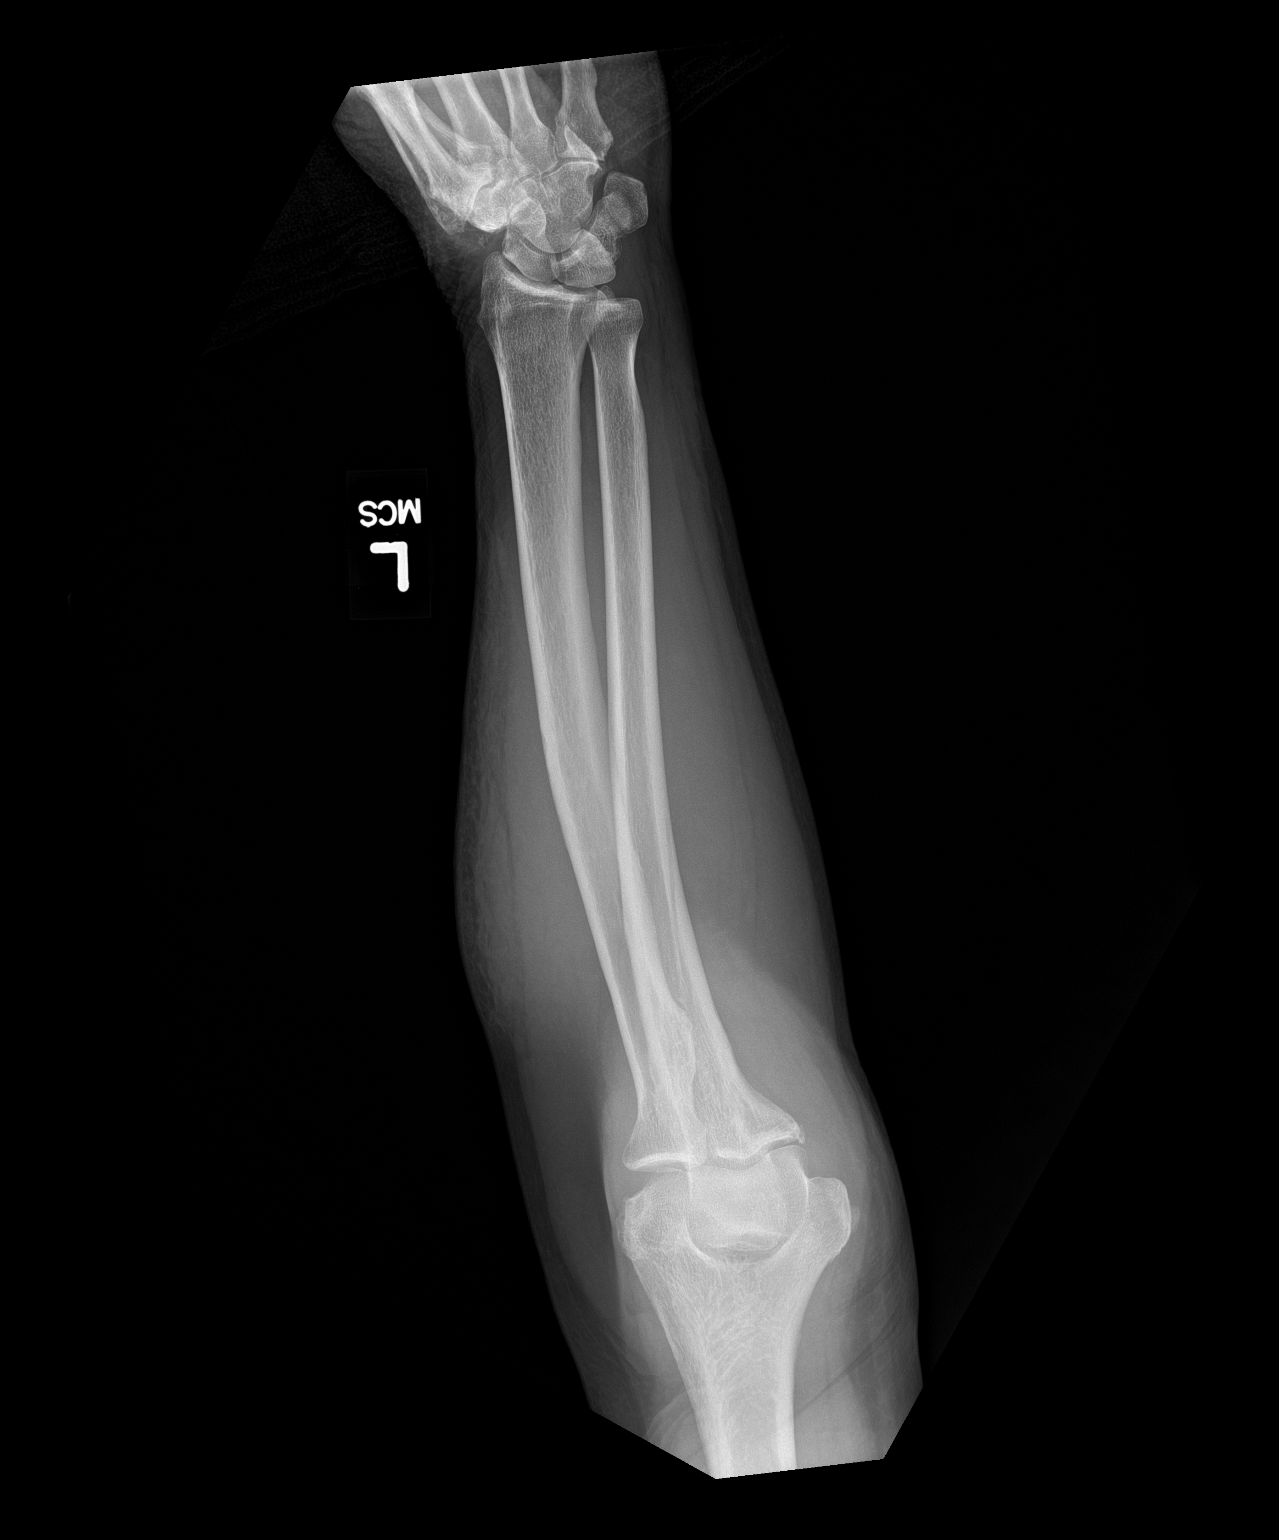

[forearm lat]
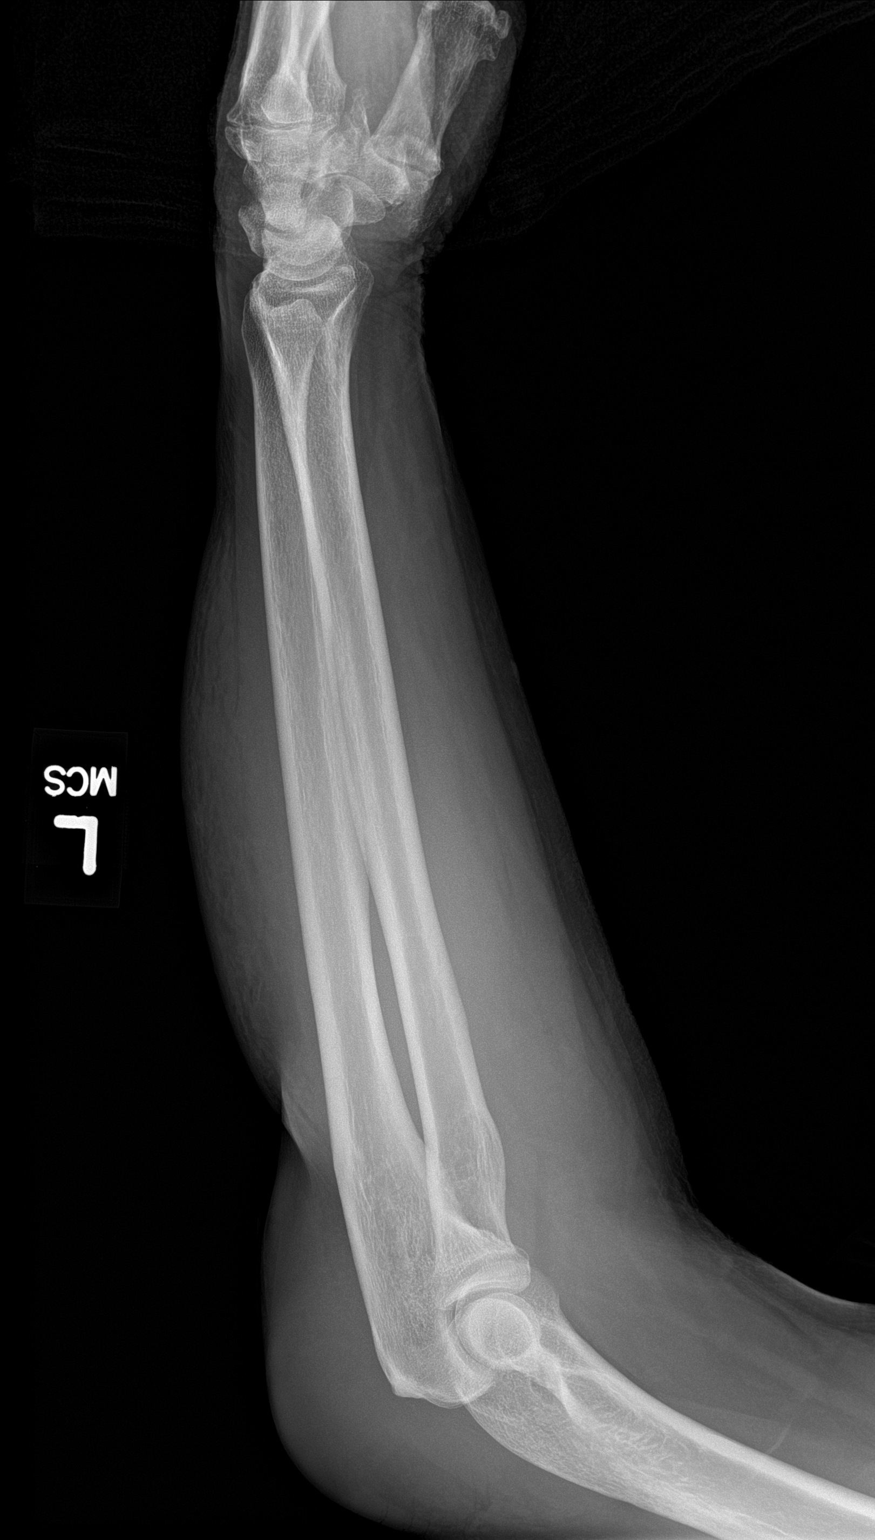

[2 of 2 positions shown; findings below may reference images not displayed]

FINDINGS: There is no evidence of fracture or other focal bone lesions. Soft
tissue swelling is seen along the dorsal aspect of the forearm. No
evidence of radiopaque foreign body.
IMPRESSION: Dorsal soft tissue swelling. No evidence of fracture or radiopaque
foreign body.

## 2023-10-04 IMAGING — DX DG ELBOW COMPLETE 3+V*L*
4 series · 4 of 4 positions shown · non-contrast
Comparison: None Available.

CLINICAL DATA: Fall.  Left elbow pain and swelling.

EXAM:
LEFT ELBOW - COMPLETE 3+ VIEW

[elbow ap]
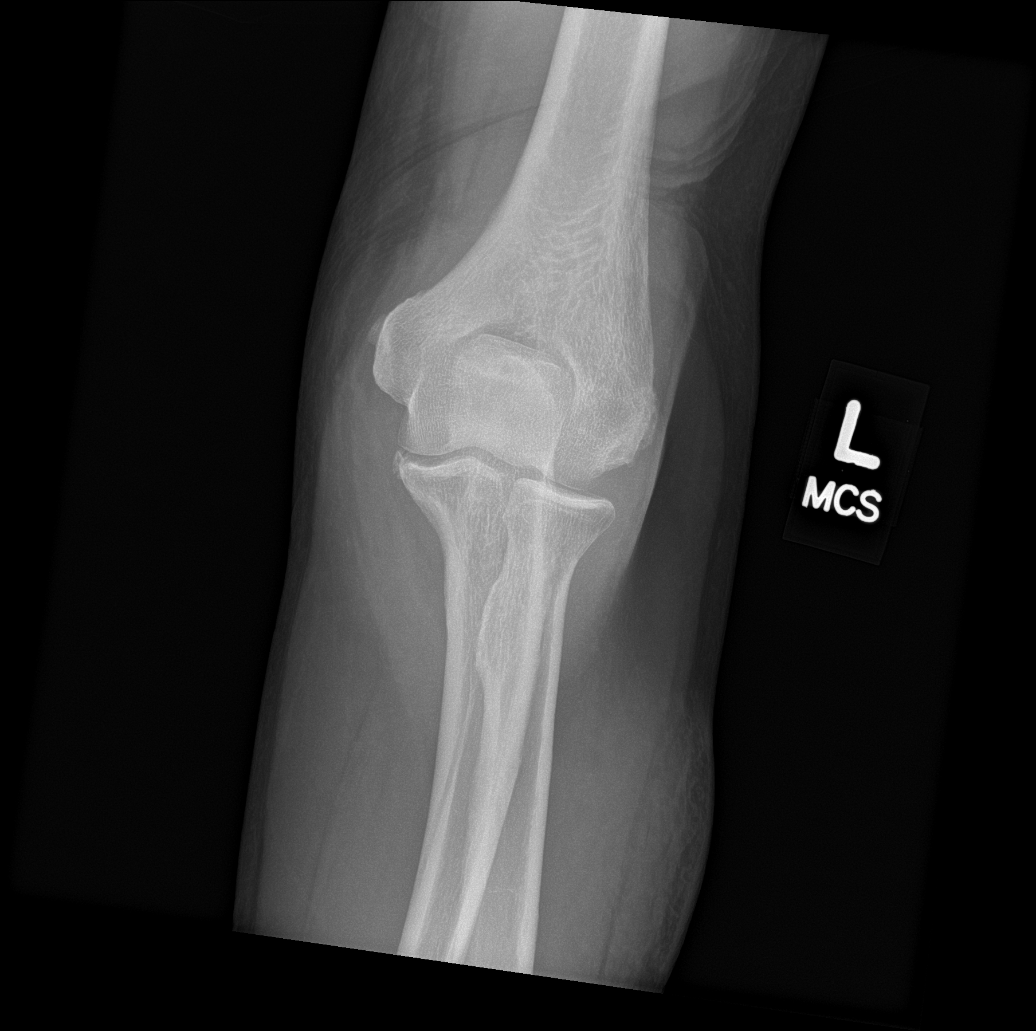

[elbow obl (1 of 2)]
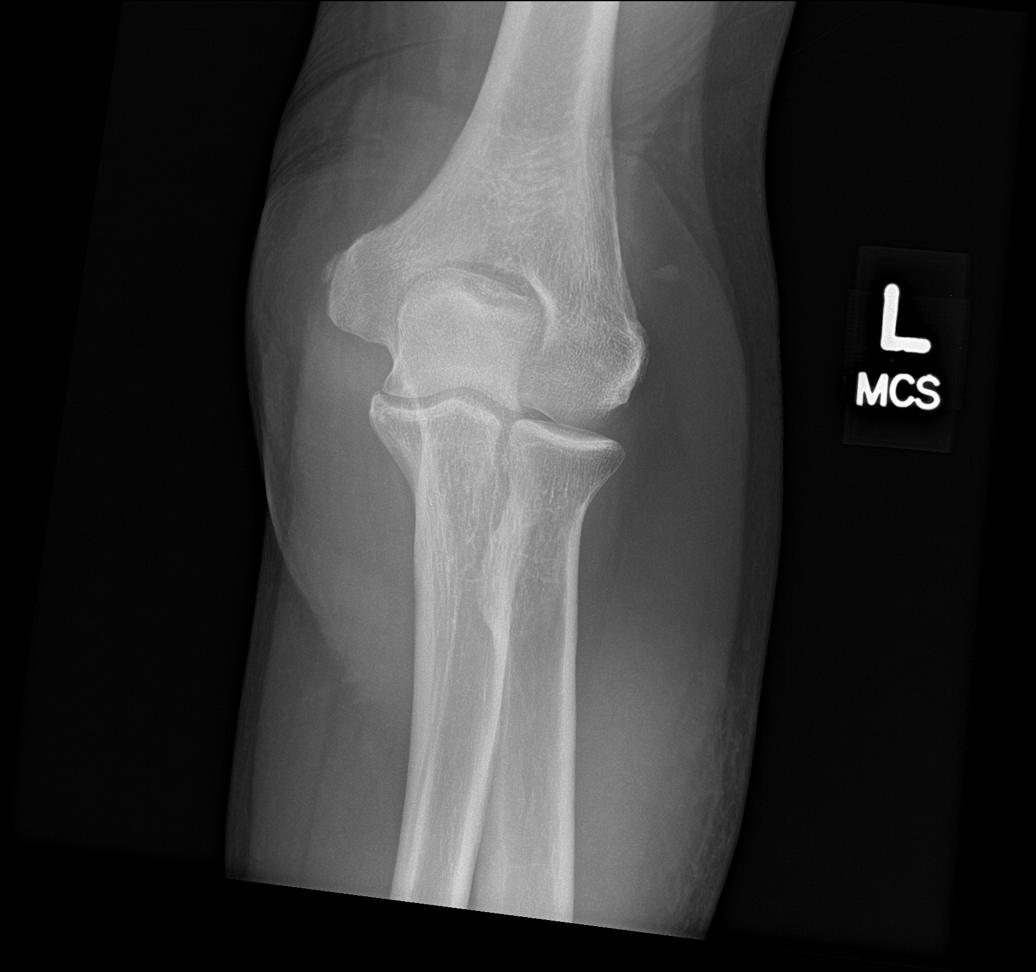

[elbow obl (2 of 2)]
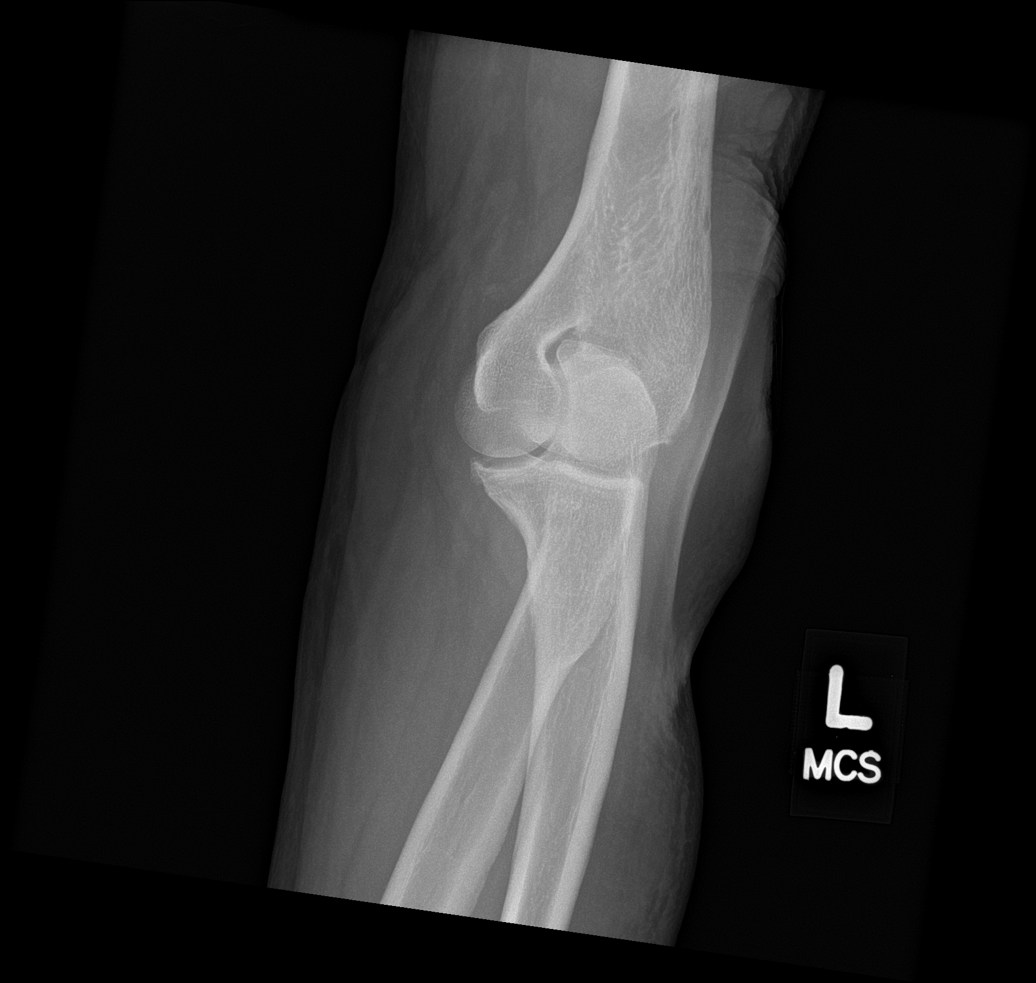

[elbow lat]
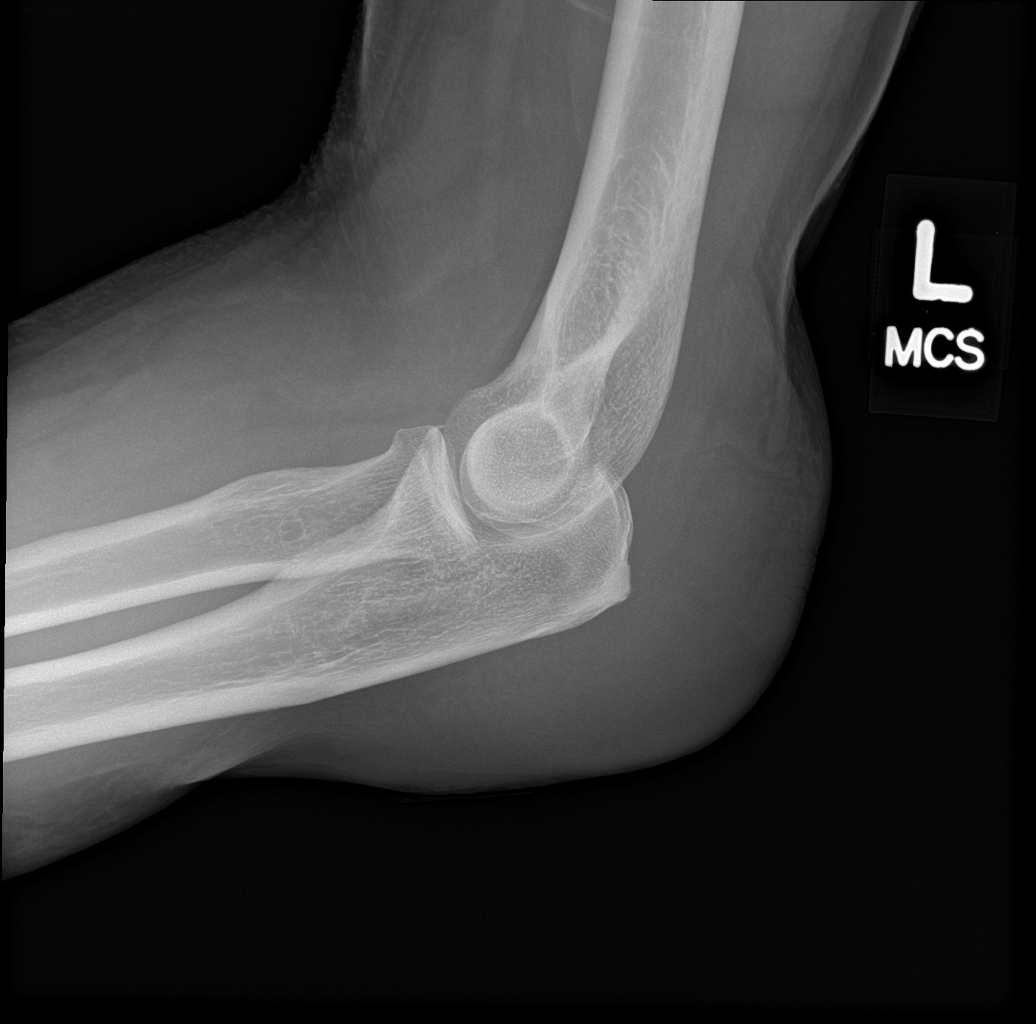

[4 of 4 positions shown; findings below may reference images not displayed]

FINDINGS: There is no evidence of fracture, dislocation, or joint effusion.
Mild degenerative spurring is seen involving the coronoid process of
the ulna. Prominent posterior soft tissue swelling is seen overlying
the olecranon process.
IMPRESSION: Prominent posterior soft tissue swelling. No evidence of fracture or
dislocation.

## 2023-10-04 IMAGING — DX DG HIP (WITH OR WITHOUT PELVIS) 2-3V*L*
3 series · 3 of 3 positions shown · non-contrast
Comparison: None Available.

CLINICAL DATA: Fall.  Left hip injury and pain.

EXAM:
DG HIP (WITH OR WITHOUT PELVIS) 2-3V LEFT

[pelvis ap (1 of 2)]
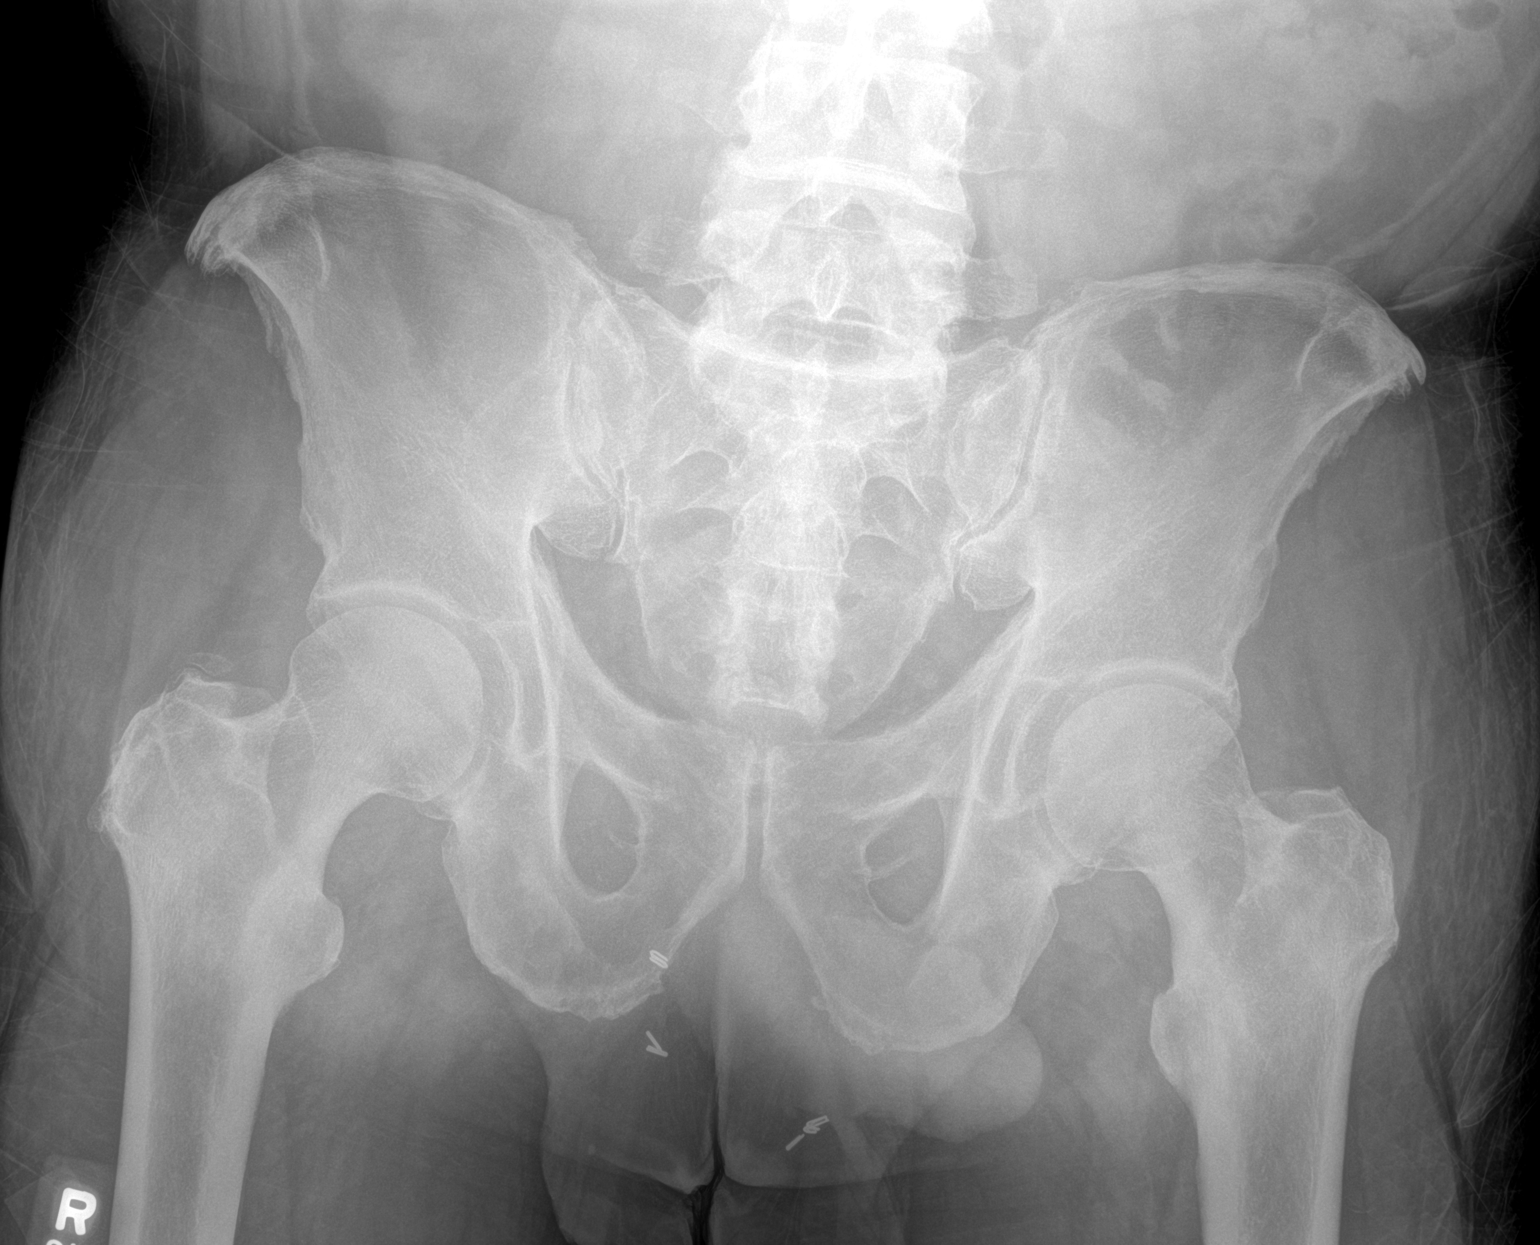

[pelvis ap (2 of 2)]
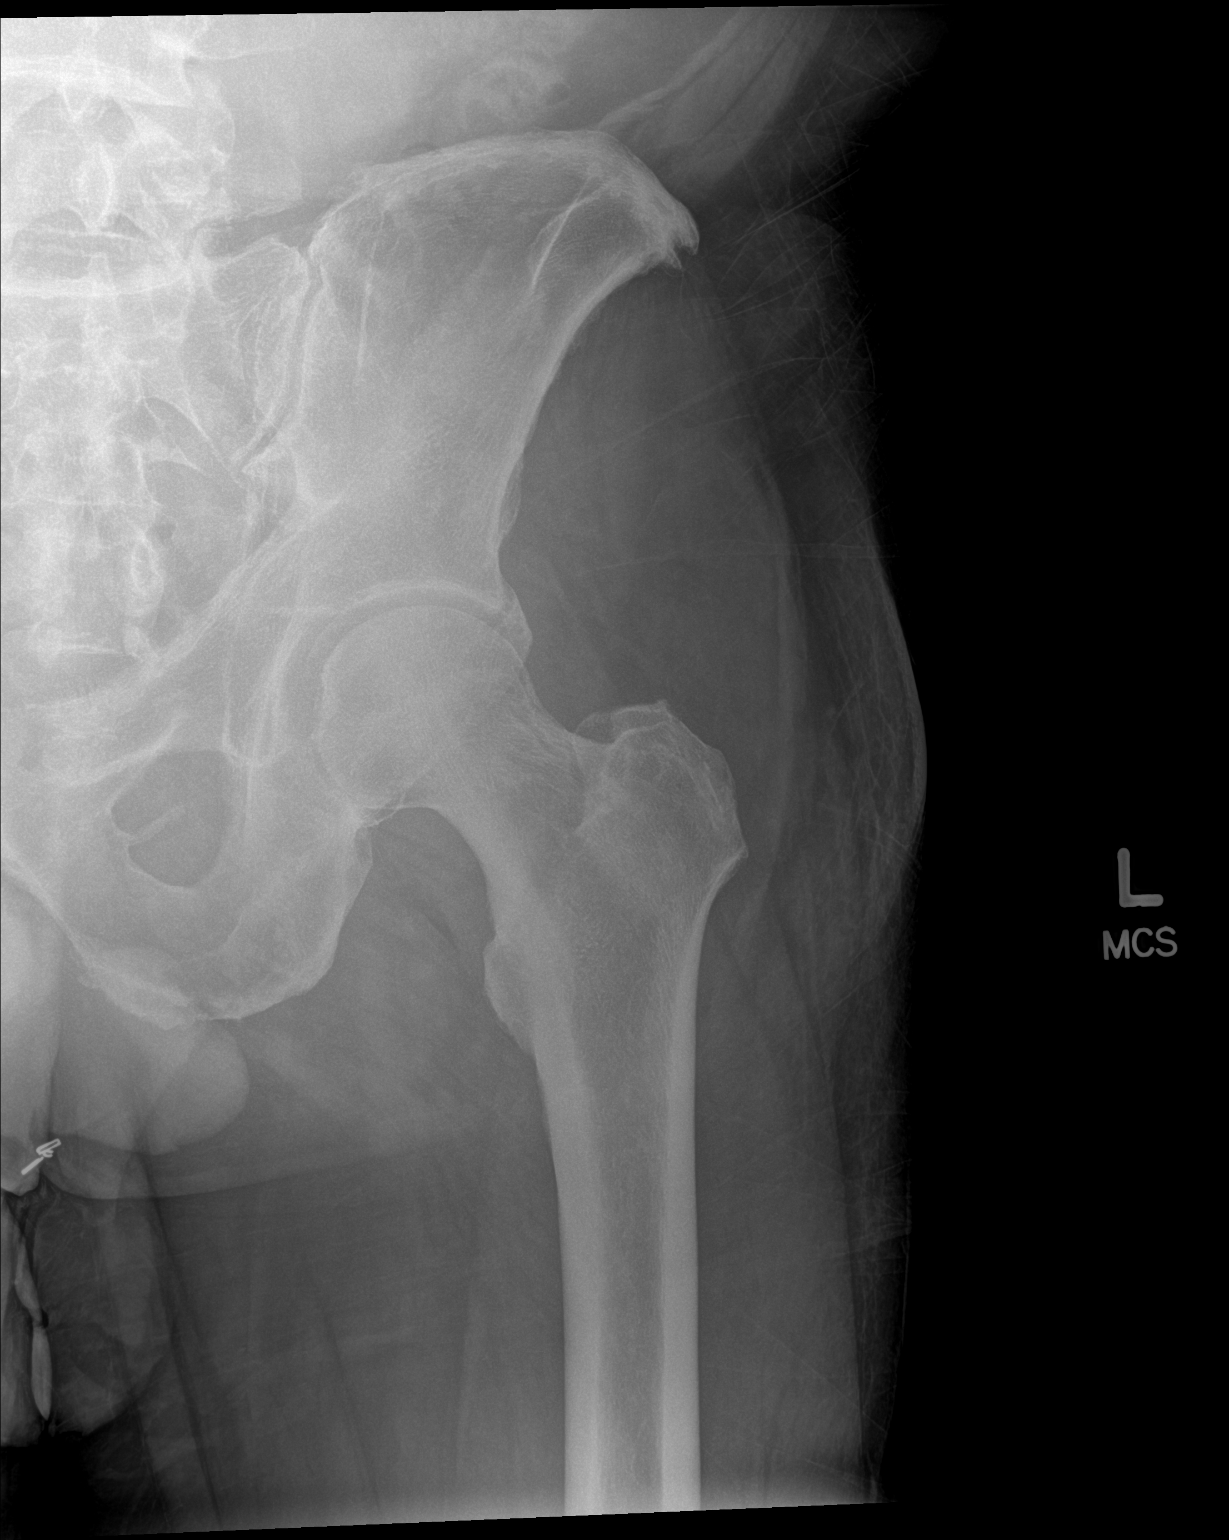

[hip frog leg]
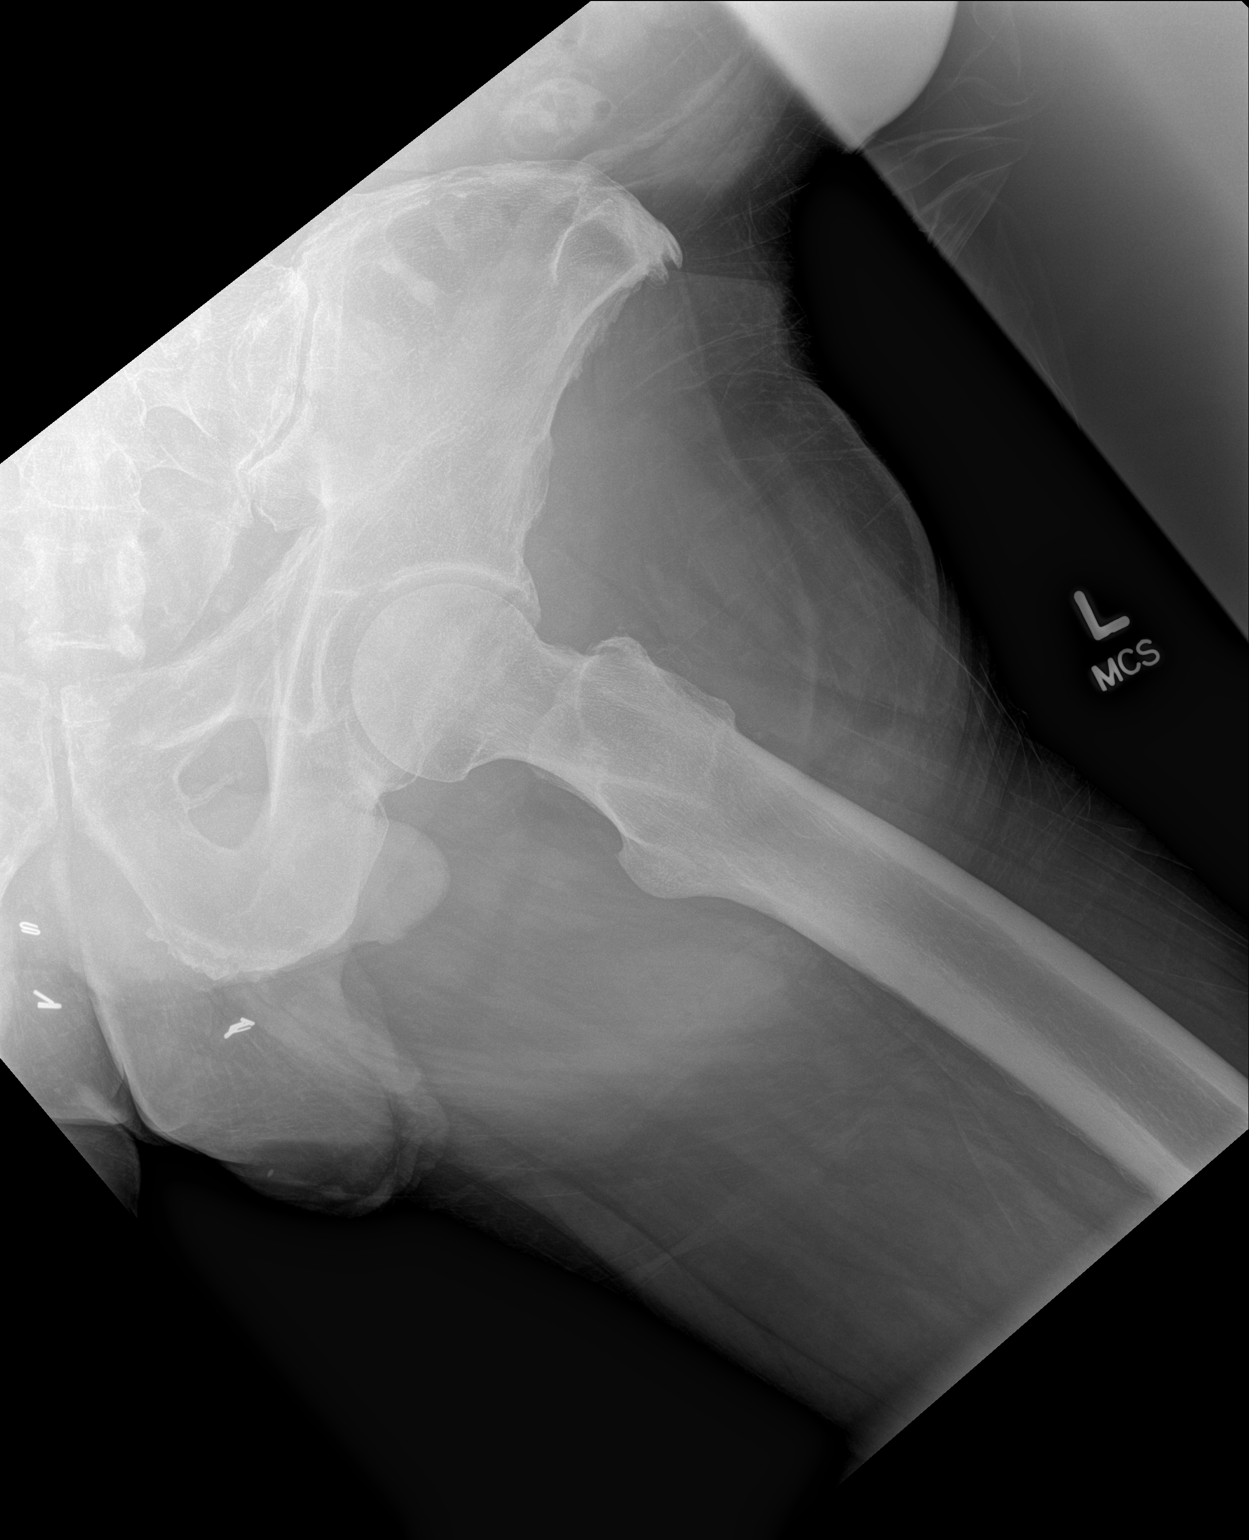

[3 of 3 positions shown; findings below may reference images not displayed]

FINDINGS: There is no evidence of hip fracture or dislocation. Mild
degenerative spurring of the acetabulum is seen without joint space
narrowing.
IMPRESSION: No acute findings.

## 2023-10-15 DIAGNOSIS — Z85828 Personal history of other malignant neoplasm of skin: Secondary | ICD-10-CM | POA: Diagnosis not present

## 2023-10-15 DIAGNOSIS — L820 Inflamed seborrheic keratosis: Secondary | ICD-10-CM | POA: Diagnosis not present

## 2023-10-15 DIAGNOSIS — D1801 Hemangioma of skin and subcutaneous tissue: Secondary | ICD-10-CM | POA: Diagnosis not present

## 2023-10-15 DIAGNOSIS — L570 Actinic keratosis: Secondary | ICD-10-CM | POA: Diagnosis not present

## 2023-10-15 DIAGNOSIS — L821 Other seborrheic keratosis: Secondary | ICD-10-CM | POA: Diagnosis not present

## 2023-10-15 DIAGNOSIS — D692 Other nonthrombocytopenic purpura: Secondary | ICD-10-CM | POA: Diagnosis not present

## 2023-10-16 DIAGNOSIS — H524 Presbyopia: Secondary | ICD-10-CM | POA: Diagnosis not present

## 2023-10-16 DIAGNOSIS — Z961 Presence of intraocular lens: Secondary | ICD-10-CM | POA: Diagnosis not present

## 2023-10-16 DIAGNOSIS — H2512 Age-related nuclear cataract, left eye: Secondary | ICD-10-CM | POA: Diagnosis not present

## 2023-10-16 DIAGNOSIS — H25012 Cortical age-related cataract, left eye: Secondary | ICD-10-CM | POA: Diagnosis not present

## 2023-10-16 DIAGNOSIS — H5203 Hypermetropia, bilateral: Secondary | ICD-10-CM | POA: Diagnosis not present

## 2023-10-16 DIAGNOSIS — H35371 Puckering of macula, right eye: Secondary | ICD-10-CM | POA: Diagnosis not present

## 2023-11-03 DIAGNOSIS — S9032XA Contusion of left foot, initial encounter: Secondary | ICD-10-CM | POA: Diagnosis not present

## 2023-11-03 DIAGNOSIS — M19072 Primary osteoarthritis, left ankle and foot: Secondary | ICD-10-CM | POA: Diagnosis not present

## 2023-11-03 DIAGNOSIS — S99922A Unspecified injury of left foot, initial encounter: Secondary | ICD-10-CM | POA: Diagnosis not present

## 2023-11-21 ENCOUNTER — Other Ambulatory Visit: Payer: Self-pay

## 2023-11-21 ENCOUNTER — Emergency Department (HOSPITAL_BASED_OUTPATIENT_CLINIC_OR_DEPARTMENT_OTHER)
Admission: EM | Admit: 2023-11-21 | Discharge: 2023-11-22 | Disposition: A | Discharge status: Home / Self Care | Attending: Emergency Medicine | Admitting: Emergency Medicine

## 2023-11-21 DIAGNOSIS — E871 Hypo-osmolality and hyponatremia: Secondary | ICD-10-CM | POA: Diagnosis not present

## 2023-11-21 DIAGNOSIS — R519 Headache, unspecified: Secondary | ICD-10-CM | POA: Insufficient documentation

## 2023-11-21 DIAGNOSIS — Y92002 Bathroom of unspecified non-institutional (private) residence single-family (private) house as the place of occurrence of the external cause: Secondary | ICD-10-CM | POA: Insufficient documentation

## 2023-11-21 DIAGNOSIS — D649 Anemia, unspecified: Secondary | ICD-10-CM | POA: Insufficient documentation

## 2023-11-21 DIAGNOSIS — Z7982 Long term (current) use of aspirin: Secondary | ICD-10-CM | POA: Diagnosis not present

## 2023-11-21 DIAGNOSIS — D72829 Elevated white blood cell count, unspecified: Secondary | ICD-10-CM | POA: Diagnosis not present

## 2023-11-21 DIAGNOSIS — N289 Disorder of kidney and ureter, unspecified: Secondary | ICD-10-CM | POA: Diagnosis not present

## 2023-11-21 DIAGNOSIS — W010XXA Fall on same level from slipping, tripping and stumbling without subsequent striking against object, initial encounter: Secondary | ICD-10-CM

## 2023-11-21 DIAGNOSIS — S300XXA Contusion of lower back and pelvis, initial encounter: Secondary | ICD-10-CM | POA: Insufficient documentation

## 2023-11-21 DIAGNOSIS — Z7984 Long term (current) use of oral hypoglycemic drugs: Secondary | ICD-10-CM | POA: Diagnosis not present

## 2023-11-21 DIAGNOSIS — Z79899 Other long term (current) drug therapy: Secondary | ICD-10-CM | POA: Diagnosis not present

## 2023-11-21 DIAGNOSIS — Z7902 Long term (current) use of antithrombotics/antiplatelets: Secondary | ICD-10-CM | POA: Insufficient documentation

## 2023-11-21 DIAGNOSIS — I10 Essential (primary) hypertension: Secondary | ICD-10-CM | POA: Insufficient documentation

## 2023-11-21 DIAGNOSIS — I2510 Atherosclerotic heart disease of native coronary artery without angina pectoris: Secondary | ICD-10-CM | POA: Diagnosis not present

## 2023-11-21 DIAGNOSIS — W16211A Fall in (into) filled bathtub causing drowning and submersion, initial encounter: Secondary | ICD-10-CM | POA: Diagnosis not present

## 2023-11-21 DIAGNOSIS — S299XXA Unspecified injury of thorax, initial encounter: Secondary | ICD-10-CM | POA: Diagnosis present

## 2023-11-21 DIAGNOSIS — S2242XA Multiple fractures of ribs, left side, initial encounter for closed fracture: Secondary | ICD-10-CM | POA: Diagnosis not present

## 2023-11-21 MED ORDER — MORPHINE SULFATE (PF) 4 MG/ML IV SOLN
4.0000 mg | Freq: Once | INTRAVENOUS | Status: AC
  Administered 2023-11-21: 4 mg via INTRAVENOUS
  Filled 2023-11-21: qty 1

## 2023-11-21 NOTE — ED Provider Notes (Signed)
 [place] EMERGENCY DEPARTMENT AT [facility] Provider Note   CSN: [id number] Arrival date & time: 11/21/23  2145     Patient presents with: [name] code is a 78 y.o. male.  {Add pertinent medical, surgical, social history, OB history to YEP:67052} The history is provided by the patient.  Fall   He has history of hypertension, hyperlipidemia, coronary artery disease, seizure disorder and comes in following a slip and fall in a bathtub.  He landed on his left side and suffered a bruise to his left sacral area and injury to the left rib cage.  He denies head injury or loss of consciousness.  He was ambulatory following the fall.  He is not on any anticoagulants, but is on clopidogrel  and aspirin.    Prior to Admission medications   Medication Sig Start Date End Date Taking? Authorizing Provider  amLODipine  (NORVASC ) 5 MG tablet TAKE ONE TABLET TWICE DAILY 08/13/23   [place], Peter M, MD  aspirin 81 MG tablet Take 81 mg by mouth daily.    [provider]  carbamazepine  (TEGRETOL ) 200 MG tablet TAKE 1 TABLET AT BREAKFAST, LUNCH AND BEDTIME AND 1/2 TABLET WITH DINNER 12/25/22   Millikan, [name], NP  Cholecalciferol (VITAMIN D3) 25 MCG (1000 UT) CAPS Take by mouth.    [provider]  clopidogrel  (PLAVIX ) 75 MG tablet TAKE ONE TABLET DAILY 02/20/23   [place], Peter M, MD  ezetimibe  (ZETIA ) 10 MG tablet Take 1 tablet (10 mg total) by mouth daily. 07/16/23   [place], Peter M, MD  gabapentin (NEURONTIN) 100 MG capsule Take 100 mg by mouth 3 (three) times daily.    [provider]  GEMTESA 75 MG TABS Take 1 tablet by mouth daily. 07/22/22   [provider]  hydrALAZINE  (APRESOLINE ) 50 MG tablet TAKE ONE TABLET BY MOUTH THREE TIMES DAILY 07/22/23   [place], Peter M, MD  LIVALO  2 MG TABS TAKE ONE TABLET EACH DAY 01/28/23   [place], Peter M, MD  nebivolol  (BYSTOLIC ) 10 MG tablet TAKE ONE TABLET EVERY DAY 03/05/23   [place], Peter M, MD  neomycin-polymyxin  b-dexamethasone (MAXITROL) 3.5-10000-0.1 SUSP Place 1 drop into the left eye 2 (two) times daily. 07/17/23   [provider]  telmisartan (MICARDIS) 80 MG tablet 80 mg.    [provider]    Allergies: Hydrochlorothiazide    Review of Systems  All other systems reviewed and are negative.   Updated Vital Signs BP (!) 178/61 (BP Location: Right Arm)   Pulse (!) 53   Temp 98.1 F (36.7 C)   Resp 18   Ht 6' 1 (1.854 m)   Wt 95.3 kg   SpO2 95%   BMI 27.71 kg/m   Physical Exam Vitals and nursing note reviewed.   78 year old male, resting comfortably and in no acute distress. Vital signs are significant for elevated blood pressure and slightly slow heart rate. Oxygen saturation is 95%, which is normal. Head is normocephalic and atraumatic. PERRLA, EOMI.  Neck is nontender Back is nontender in the midline.  There is an ecchymosis over the left sacral area with tenderness but no significant swelling Lungs are clear without rales, wheezes, or rhonchi. Chest is very tender over the left lower rib cage posteriorly.  There is no crepitus. Heart has regular rate and rhythm without murmur. Abdomen is soft, flat, nontender. Extremities have no cyanosis or edema, full range of motion is present. Skin is warm and dry without rash.  Neurologic: Mental status is normal, cranial nerves are intact, moves all extremities equally.  (all labs ordered are listed, but only abnormal results are displayed) Labs Reviewed - No data to display  EKG: None  Radiology: No results found.  {Document cardiac monitor, telemetry assessment procedure when appropriate:32947} Procedures   Medications Ordered in the ED - No data to display    {Click here for ABCD2, HEART and other calculators REFRESH Note before signing:1}                              Medical Decision Making  Fall with major injury to the left posterior rib cage.  Because of mechanism of injury, I am ordering CT of head  and cervical spine as well as CT scans of chest/abdomen/pelvis to evaluate for rib fracture and internal injury.  I have also ordered screening labs, morphine  for pain.  {Document critical care time when appropriate  Document review of labs and clinical decision tools ie CHADS2VASC2, etc  Document your independent review of radiology images and any outside records  Document your discussion with family members, caretakers and with consultants  Document social determinants of health affecting pt's care  Document your decision making why or why not admission, treatments were needed:32947:::1}   Final diagnoses:  None    ED Discharge Orders     None

## 2023-11-21 NOTE — ED Triage Notes (Signed)
 Pt POV ambulatory to triage after fall in shower, was washing feet, slipped and fell backwards, landed on r side, reporting R ribcage pain that goes down to R hip. On plavix , did not hit head.

## 2023-11-22 ENCOUNTER — Emergency Department (HOSPITAL_BASED_OUTPATIENT_CLINIC_OR_DEPARTMENT_OTHER)

## 2023-11-22 DIAGNOSIS — S0990XA Unspecified injury of head, initial encounter: Secondary | ICD-10-CM | POA: Diagnosis not present

## 2023-11-22 DIAGNOSIS — S199XXA Unspecified injury of neck, initial encounter: Secondary | ICD-10-CM | POA: Diagnosis not present

## 2023-11-22 DIAGNOSIS — S2242XA Multiple fractures of ribs, left side, initial encounter for closed fracture: Secondary | ICD-10-CM | POA: Diagnosis not present

## 2023-11-22 DIAGNOSIS — S3991XA Unspecified injury of abdomen, initial encounter: Secondary | ICD-10-CM | POA: Diagnosis not present

## 2023-11-22 LAB — COMPREHENSIVE METABOLIC PANEL WITH GFR
ALT: 18 U/L (ref 0–44)
AST: 22 U/L (ref 15–41)
Albumin: 4.4 g/dL (ref 3.5–5.0)
Alkaline Phosphatase: 86 U/L (ref 38–126)
Anion gap: 14 (ref 5–15)
BUN: 25 mg/dL — ABNORMAL HIGH (ref 8–23)
CO2: 20 mmol/L — ABNORMAL LOW (ref 22–32)
Calcium: 9.2 mg/dL (ref 8.9–10.3)
Chloride: 96 mmol/L — ABNORMAL LOW (ref 98–111)
Creatinine, Ser: 1.27 mg/dL — ABNORMAL HIGH (ref 0.61–1.24)
GFR, Estimated: 57 mL/min — ABNORMAL LOW (ref >60)
Glucose, Bld: 123 mg/dL — ABNORMAL HIGH (ref 70–99)
Potassium: 4.9 mmol/L (ref 3.5–5.1)
Sodium: 130 mmol/L — ABNORMAL LOW (ref 135–145)
Total Bilirubin: 0.3 mg/dL (ref 0.0–1.2)
Total Protein: 7 g/dL (ref 6.5–8.1)

## 2023-11-22 LAB — CBC WITH DIFFERENTIAL/PLATELET
Abs Immature Granulocytes: 0.14 K/uL — ABNORMAL HIGH (ref 0.00–0.07)
Basophils Absolute: 0 K/uL (ref 0.0–0.1)
Basophils Relative: 0 %
Eosinophils Absolute: 0 K/uL (ref 0.0–0.5)
Eosinophils Relative: 0 %
HCT: 34.6 % — ABNORMAL LOW (ref 39.0–52.0)
Hemoglobin: 11.9 g/dL — ABNORMAL LOW (ref 13.0–17.0)
Immature Granulocytes: 1 %
Lymphocytes Relative: 8 %
Lymphs Abs: 1.4 K/uL (ref 0.7–4.0)
MCH: 28.9 pg (ref 26.0–34.0)
MCHC: 34.4 g/dL (ref 30.0–36.0)
MCV: 84 fL (ref 80.0–100.0)
Monocytes Absolute: 1 K/uL (ref 0.1–1.0)
Monocytes Relative: 6 %
Neutro Abs: 14.3 K/uL — ABNORMAL HIGH (ref 1.7–7.7)
Neutrophils Relative %: 85 %
Platelets: 224 K/uL (ref 150–400)
RBC: 4.12 MIL/uL — ABNORMAL LOW (ref 4.22–5.81)
RDW: 13.1 % (ref 11.5–15.5)
WBC: 17 K/uL — ABNORMAL HIGH (ref 4.0–10.5)
nRBC: 0 % (ref 0.0–0.2)

## 2023-11-22 MED ORDER — TRAMADOL HCL 50 MG PO TABS: 50.0000 mg | ORAL_TABLET | Freq: Four times a day (QID) | ORAL | 0 refills | Status: DC | PRN

## 2023-11-22 MED ORDER — IOHEXOL 300 MG/ML  SOLN
100.0000 mL | Freq: Once | INTRAMUSCULAR | Status: AC | PRN
  Administered 2023-11-22: 100 mL via INTRAVENOUS

## 2023-11-22 MED ORDER — TRAMADOL HCL 50 MG PO TABS
50.0000 mg | ORAL_TABLET | Freq: Once | ORAL | Status: AC
  Administered 2023-11-22: 50 mg via ORAL
  Filled 2023-11-22: qty 1

## 2023-11-22 MED ORDER — LIDOCAINE 5 % EX PTCH: 1.0000 | MEDICATED_PATCH | CUTANEOUS | 0 refills | Status: AC

## 2023-11-22 NOTE — ED Notes (Signed)
 Questions and concerns addressed. Discharge teaching completed.   Prescriptions reviewed and pharmacy verified.   Pt ambulatory upon discharge.

## 2023-11-22 NOTE — Discharge Instructions (Addendum)
 Apply ice for 30 minutes at a time, 4 times a day.  You may take acetaminophen 650 mg every 6 hours as needed for pain.  If you need additional pain relief, you may add ibuprofen, but do not do this more than twice a day.  You may also take tramadol  for pain.  If tramadol  is not giving you sufficient pain relief, you may need to discuss with your primary care provider having a stronger narcotic prescribed.

## 2023-11-24 DIAGNOSIS — S2242XA Multiple fractures of ribs, left side, initial encounter for closed fracture: Secondary | ICD-10-CM | POA: Diagnosis not present

## 2023-11-24 DIAGNOSIS — M7918 Myalgia, other site: Secondary | ICD-10-CM | POA: Diagnosis not present

## 2023-11-24 DIAGNOSIS — R0781 Pleurodynia: Secondary | ICD-10-CM | POA: Diagnosis not present

## 2023-11-24 DIAGNOSIS — W19XXXA Unspecified fall, initial encounter: Secondary | ICD-10-CM | POA: Diagnosis not present

## 2023-11-24 DIAGNOSIS — I10 Essential (primary) hypertension: Secondary | ICD-10-CM | POA: Diagnosis not present

## 2023-11-24 DIAGNOSIS — K5900 Constipation, unspecified: Secondary | ICD-10-CM | POA: Diagnosis not present

## 2023-12-09 DIAGNOSIS — G629 Polyneuropathy, unspecified: Secondary | ICD-10-CM | POA: Diagnosis not present

## 2023-12-09 DIAGNOSIS — R400 Somnolence: Secondary | ICD-10-CM | POA: Diagnosis not present

## 2023-12-09 DIAGNOSIS — R39198 Other difficulties with micturition: Secondary | ICD-10-CM | POA: Diagnosis not present

## 2023-12-09 DIAGNOSIS — G40909 Epilepsy, unspecified, not intractable, without status epilepticus: Secondary | ICD-10-CM | POA: Diagnosis not present

## 2023-12-09 DIAGNOSIS — E785 Hyperlipidemia, unspecified: Secondary | ICD-10-CM | POA: Diagnosis not present

## 2023-12-09 DIAGNOSIS — I10 Essential (primary) hypertension: Secondary | ICD-10-CM | POA: Diagnosis not present

## 2023-12-09 DIAGNOSIS — S2249XD Multiple fractures of ribs, unspecified side, subsequent encounter for fracture with routine healing: Secondary | ICD-10-CM | POA: Diagnosis not present

## 2023-12-14 DIAGNOSIS — F411 Generalized anxiety disorder: Secondary | ICD-10-CM | POA: Diagnosis not present

## 2023-12-14 DIAGNOSIS — F332 Major depressive disorder, recurrent severe without psychotic features: Secondary | ICD-10-CM | POA: Diagnosis not present

## 2023-12-14 DIAGNOSIS — R5381 Other malaise: Secondary | ICD-10-CM | POA: Diagnosis not present

## 2023-12-14 DIAGNOSIS — R2689 Other abnormalities of gait and mobility: Secondary | ICD-10-CM | POA: Diagnosis not present

## 2023-12-14 DIAGNOSIS — G629 Polyneuropathy, unspecified: Secondary | ICD-10-CM | POA: Diagnosis not present

## 2023-12-14 LAB — LAB REPORT - SCANNED: EGFR: 72.1

## 2023-12-15 DIAGNOSIS — R269 Unspecified abnormalities of gait and mobility: Secondary | ICD-10-CM | POA: Diagnosis not present

## 2023-12-15 DIAGNOSIS — M6281 Muscle weakness (generalized): Secondary | ICD-10-CM | POA: Diagnosis not present

## 2023-12-17 DIAGNOSIS — F411 Generalized anxiety disorder: Secondary | ICD-10-CM | POA: Diagnosis not present

## 2023-12-18 DIAGNOSIS — R269 Unspecified abnormalities of gait and mobility: Secondary | ICD-10-CM | POA: Diagnosis not present

## 2023-12-18 DIAGNOSIS — M6281 Muscle weakness (generalized): Secondary | ICD-10-CM | POA: Diagnosis not present

## 2023-12-21 ENCOUNTER — Encounter (HOSPITAL_COMMUNITY): Payer: Self-pay

## 2023-12-21 ENCOUNTER — Emergency Department (HOSPITAL_COMMUNITY)

## 2023-12-21 ENCOUNTER — Other Ambulatory Visit: Payer: Self-pay

## 2023-12-21 ENCOUNTER — Inpatient Hospital Stay (HOSPITAL_COMMUNITY)
Admission: EM | Admit: 2023-12-21 | Discharge: 2023-12-24 | DRG: 199 | Disposition: A | Discharge status: Home / Self Care | Attending: Family Medicine | Admitting: Family Medicine

## 2023-12-21 DIAGNOSIS — N400 Enlarged prostate without lower urinary tract symptoms: Secondary | ICD-10-CM | POA: Diagnosis present

## 2023-12-21 DIAGNOSIS — R32 Unspecified urinary incontinence: Secondary | ICD-10-CM | POA: Diagnosis present

## 2023-12-21 DIAGNOSIS — J939 Pneumothorax, unspecified: Secondary | ICD-10-CM | POA: Diagnosis not present

## 2023-12-21 DIAGNOSIS — D649 Anemia, unspecified: Secondary | ICD-10-CM | POA: Diagnosis not present

## 2023-12-21 DIAGNOSIS — I4510 Unspecified right bundle-branch block: Secondary | ICD-10-CM | POA: Diagnosis present

## 2023-12-21 DIAGNOSIS — J189 Pneumonia, unspecified organism: Secondary | ICD-10-CM | POA: Diagnosis present

## 2023-12-21 DIAGNOSIS — R591 Generalized enlarged lymph nodes: Secondary | ICD-10-CM | POA: Diagnosis not present

## 2023-12-21 DIAGNOSIS — G629 Polyneuropathy, unspecified: Secondary | ICD-10-CM | POA: Diagnosis not present

## 2023-12-21 DIAGNOSIS — F32A Depression, unspecified: Secondary | ICD-10-CM | POA: Diagnosis not present

## 2023-12-21 DIAGNOSIS — R2689 Other abnormalities of gait and mobility: Secondary | ICD-10-CM | POA: Diagnosis not present

## 2023-12-21 DIAGNOSIS — Z888 Allergy status to other drugs, medicaments and biological substances status: Secondary | ICD-10-CM | POA: Diagnosis not present

## 2023-12-21 DIAGNOSIS — E7800 Pure hypercholesterolemia, unspecified: Secondary | ICD-10-CM | POA: Diagnosis not present

## 2023-12-21 DIAGNOSIS — S2242XD Multiple fractures of ribs, left side, subsequent encounter for fracture with routine healing: Secondary | ICD-10-CM

## 2023-12-21 DIAGNOSIS — I129 Hypertensive chronic kidney disease with stage 1 through stage 4 chronic kidney disease, or unspecified chronic kidney disease: Secondary | ICD-10-CM | POA: Diagnosis present

## 2023-12-21 DIAGNOSIS — S271XXA Traumatic hemothorax, initial encounter: Principal | ICD-10-CM | POA: Diagnosis present

## 2023-12-21 DIAGNOSIS — Z7982 Long term (current) use of aspirin: Secondary | ICD-10-CM

## 2023-12-21 DIAGNOSIS — R0602 Shortness of breath: Secondary | ICD-10-CM | POA: Diagnosis present

## 2023-12-21 DIAGNOSIS — G4730 Sleep apnea, unspecified: Secondary | ICD-10-CM | POA: Diagnosis present

## 2023-12-21 DIAGNOSIS — Z1152 Encounter for screening for COVID-19: Secondary | ICD-10-CM | POA: Diagnosis not present

## 2023-12-21 DIAGNOSIS — D631 Anemia in chronic kidney disease: Secondary | ICD-10-CM | POA: Diagnosis present

## 2023-12-21 DIAGNOSIS — I2510 Atherosclerotic heart disease of native coronary artery without angina pectoris: Secondary | ICD-10-CM | POA: Diagnosis present

## 2023-12-21 DIAGNOSIS — J9811 Atelectasis: Secondary | ICD-10-CM | POA: Diagnosis not present

## 2023-12-21 DIAGNOSIS — G40909 Epilepsy, unspecified, not intractable, without status epilepticus: Secondary | ICD-10-CM | POA: Diagnosis present

## 2023-12-21 DIAGNOSIS — Z87891 Personal history of nicotine dependence: Secondary | ICD-10-CM | POA: Diagnosis not present

## 2023-12-21 DIAGNOSIS — Z8249 Family history of ischemic heart disease and other diseases of the circulatory system: Secondary | ICD-10-CM | POA: Diagnosis not present

## 2023-12-21 DIAGNOSIS — R059 Cough, unspecified: Secondary | ICD-10-CM | POA: Diagnosis not present

## 2023-12-21 DIAGNOSIS — E871 Hypo-osmolality and hyponatremia: Secondary | ICD-10-CM | POA: Diagnosis not present

## 2023-12-21 DIAGNOSIS — Z48813 Encounter for surgical aftercare following surgery on the respiratory system: Secondary | ICD-10-CM | POA: Diagnosis not present

## 2023-12-21 DIAGNOSIS — K5900 Constipation, unspecified: Secondary | ICD-10-CM | POA: Diagnosis not present

## 2023-12-21 DIAGNOSIS — Z955 Presence of coronary angioplasty implant and graft: Secondary | ICD-10-CM

## 2023-12-21 DIAGNOSIS — E785 Hyperlipidemia, unspecified: Secondary | ICD-10-CM | POA: Diagnosis present

## 2023-12-21 DIAGNOSIS — R0600 Dyspnea, unspecified: Secondary | ICD-10-CM | POA: Diagnosis not present

## 2023-12-21 DIAGNOSIS — Z79899 Other long term (current) drug therapy: Secondary | ICD-10-CM | POA: Diagnosis not present

## 2023-12-21 DIAGNOSIS — I440 Atrioventricular block, first degree: Secondary | ICD-10-CM | POA: Diagnosis present

## 2023-12-21 DIAGNOSIS — J948 Other specified pleural conditions: Secondary | ICD-10-CM | POA: Diagnosis not present

## 2023-12-21 DIAGNOSIS — Z7902 Long term (current) use of antithrombotics/antiplatelets: Secondary | ICD-10-CM | POA: Diagnosis not present

## 2023-12-21 DIAGNOSIS — J90 Pleural effusion, not elsewhere classified: Secondary | ICD-10-CM | POA: Diagnosis present

## 2023-12-21 DIAGNOSIS — R5381 Other malaise: Secondary | ICD-10-CM | POA: Diagnosis not present

## 2023-12-21 DIAGNOSIS — Z83438 Family history of other disorder of lipoprotein metabolism and other lipidemia: Secondary | ICD-10-CM

## 2023-12-21 DIAGNOSIS — E782 Mixed hyperlipidemia: Secondary | ICD-10-CM

## 2023-12-21 DIAGNOSIS — J180 Bronchopneumonia, unspecified organism: Secondary | ICD-10-CM | POA: Diagnosis not present

## 2023-12-21 DIAGNOSIS — N1831 Chronic kidney disease, stage 3a: Secondary | ICD-10-CM | POA: Diagnosis not present

## 2023-12-21 DIAGNOSIS — I10 Essential (primary) hypertension: Secondary | ICD-10-CM | POA: Diagnosis present

## 2023-12-21 DIAGNOSIS — Z87898 Personal history of other specified conditions: Secondary | ICD-10-CM

## 2023-12-21 DIAGNOSIS — R918 Other nonspecific abnormal finding of lung field: Secondary | ICD-10-CM | POA: Diagnosis not present

## 2023-12-21 DIAGNOSIS — S299XXA Unspecified injury of thorax, initial encounter: Secondary | ICD-10-CM | POA: Diagnosis not present

## 2023-12-21 DIAGNOSIS — I509 Heart failure, unspecified: Secondary | ICD-10-CM | POA: Diagnosis not present

## 2023-12-21 DIAGNOSIS — E8809 Other disorders of plasma-protein metabolism, not elsewhere classified: Secondary | ICD-10-CM | POA: Diagnosis present

## 2023-12-21 DIAGNOSIS — W19XXXD Unspecified fall, subsequent encounter: Secondary | ICD-10-CM | POA: Diagnosis present

## 2023-12-21 DIAGNOSIS — S2249XD Multiple fractures of ribs, unspecified side, subsequent encounter for fracture with routine healing: Secondary | ICD-10-CM | POA: Diagnosis not present

## 2023-12-21 DIAGNOSIS — R188 Other ascites: Secondary | ICD-10-CM | POA: Diagnosis not present

## 2023-12-21 LAB — I-STAT CHEM 8, ED
BUN: 20 mg/dL (ref 8–23)
Calcium, Ion: 1.1 mmol/L — ABNORMAL LOW (ref 1.15–1.40)
Chloride: 92 mmol/L — ABNORMAL LOW (ref 98–111)
Creatinine, Ser: 1.3 mg/dL — ABNORMAL HIGH (ref 0.61–1.24)
Glucose, Bld: 113 mg/dL — ABNORMAL HIGH (ref 70–99)
HCT: 34 % — ABNORMAL LOW (ref 39.0–52.0)
Hemoglobin: 11.6 g/dL — ABNORMAL LOW (ref 13.0–17.0)
Potassium: 4.6 mmol/L (ref 3.5–5.1)
Sodium: 122 mmol/L — ABNORMAL LOW (ref 135–145)
TCO2: 21 mmol/L — ABNORMAL LOW (ref 22–32)

## 2023-12-21 LAB — BASIC METABOLIC PANEL WITH GFR
Anion gap: 10 (ref 5–15)
BUN: 20 mg/dL (ref 8–23)
CO2: 22 mmol/L (ref 22–32)
Calcium: 8.5 mg/dL — ABNORMAL LOW (ref 8.9–10.3)
Chloride: 91 mmol/L — ABNORMAL LOW (ref 98–111)
Creatinine, Ser: 1.39 mg/dL — ABNORMAL HIGH (ref 0.61–1.24)
GFR, Estimated: 52 mL/min — ABNORMAL LOW (ref >60)
Glucose, Bld: 109 mg/dL — ABNORMAL HIGH (ref 70–99)
Potassium: 4.5 mmol/L (ref 3.5–5.1)
Sodium: 123 mmol/L — ABNORMAL LOW (ref 135–145)

## 2023-12-21 LAB — CBC
HCT: 31.3 % — ABNORMAL LOW (ref 39.0–52.0)
Hemoglobin: 10.5 g/dL — ABNORMAL LOW (ref 13.0–17.0)
MCH: 28.8 pg (ref 26.0–34.0)
MCHC: 33.5 g/dL (ref 30.0–36.0)
MCV: 85.8 fL (ref 80.0–100.0)
Platelets: 291 K/uL (ref 150–400)
RBC: 3.65 MIL/uL — ABNORMAL LOW (ref 4.22–5.81)
RDW: 13.3 % (ref 11.5–15.5)
WBC: 10.5 K/uL (ref 4.0–10.5)
nRBC: 0 % (ref 0.0–0.2)

## 2023-12-21 LAB — HEPATIC FUNCTION PANEL
ALT: 18 U/L (ref 0–44)
AST: 25 U/L (ref 15–41)
Albumin: 3.4 g/dL — ABNORMAL LOW (ref 3.5–5.0)
Alkaline Phosphatase: 87 U/L (ref 38–126)
Bilirubin, Direct: 0.2 mg/dL (ref 0.0–0.2)
Indirect Bilirubin: 0.3 mg/dL (ref 0.3–0.9)
Total Bilirubin: 0.5 mg/dL (ref 0.0–1.2)
Total Protein: 6.7 g/dL (ref 6.5–8.1)

## 2023-12-21 LAB — CARBAMAZEPINE LEVEL, TOTAL: Carbamazepine Lvl: 12.9 ug/mL — ABNORMAL HIGH (ref 4.0–12.0)

## 2023-12-21 MED ORDER — SODIUM CHLORIDE 0.9 % IV SOLN
1.0000 g | INTRAVENOUS | Status: DC
  Administered 2023-12-21: 1 g via INTRAVENOUS
  Filled 2023-12-21: qty 10

## 2023-12-21 MED ORDER — SODIUM CHLORIDE 0.9 % IV SOLN
500.0000 mg | INTRAVENOUS | Status: DC
  Administered 2023-12-21 – 2023-12-23 (×3): 500 mg via INTRAVENOUS
  Filled 2023-12-21 (×3): qty 5

## 2023-12-21 MED ORDER — GABAPENTIN 100 MG PO CAPS
100.0000 mg | ORAL_CAPSULE | Freq: Three times a day (TID) | ORAL | Status: DC
Start: 2023-12-21 — End: 2023-12-22
  Administered 2023-12-21: 100 mg via ORAL
  Filled 2023-12-21 (×2): qty 1

## 2023-12-21 MED ORDER — MELATONIN 3 MG PO TABS
3.0000 mg | ORAL_TABLET | Freq: Every evening | ORAL | Status: DC | PRN
  Administered 2023-12-22: 3 mg via ORAL
  Filled 2023-12-21 (×2): qty 1

## 2023-12-21 MED ORDER — IOHEXOL 350 MG/ML SOLN
50.0000 mL | Freq: Once | INTRAVENOUS | Status: AC | PRN
Start: 2023-12-21 — End: 2023-12-21
  Administered 2023-12-21: 50 mL via INTRAVENOUS

## 2023-12-21 MED ORDER — EZETIMIBE 10 MG PO TABS
10.0000 mg | ORAL_TABLET | Freq: Every day | ORAL | Status: DC
  Administered 2023-12-22 – 2023-12-24 (×3): 10 mg via ORAL
  Filled 2023-12-21 (×3): qty 1

## 2023-12-21 MED ORDER — ASPIRIN 81 MG PO TBEC
81.0000 mg | DELAYED_RELEASE_TABLET | Freq: Every day | ORAL | Status: DC
Start: 2023-12-22 — End: 2023-12-22
  Filled 2023-12-21: qty 1

## 2023-12-21 MED ORDER — NEBIVOLOL HCL 5 MG PO TABS
5.0000 mg | ORAL_TABLET | Freq: Every day | ORAL | Status: DC
  Administered 2023-12-22: 5 mg via ORAL
  Filled 2023-12-21 (×2): qty 1

## 2023-12-21 MED ORDER — AMLODIPINE BESYLATE 5 MG PO TABS
5.0000 mg | ORAL_TABLET | Freq: Two times a day (BID) | ORAL | Status: DC
  Administered 2023-12-21 – 2023-12-24 (×6): 5 mg via ORAL
  Filled 2023-12-21 (×6): qty 1

## 2023-12-21 MED ORDER — ACETAMINOPHEN 325 MG PO TABS: 650.0000 mg | ORAL_TABLET | Freq: Four times a day (QID) | ORAL | Status: DC | PRN

## 2023-12-21 MED ORDER — ACETAMINOPHEN 650 MG RE SUPP: 650.0000 mg | Freq: Four times a day (QID) | RECTAL | Status: DC | PRN

## 2023-12-21 MED ORDER — HYDRALAZINE HCL 50 MG PO TABS
50.0000 mg | ORAL_TABLET | Freq: Three times a day (TID) | ORAL | Status: DC
  Administered 2023-12-22 – 2023-12-24 (×7): 50 mg via ORAL
  Filled 2023-12-21 (×4): qty 1
  Filled 2023-12-21: qty 2
  Filled 2023-12-21 (×2): qty 1

## 2023-12-21 MED ORDER — HYDRALAZINE HCL 20 MG/ML IJ SOLN
10.0000 mg | INTRAMUSCULAR | Status: DC | PRN
  Filled 2023-12-21: qty 1

## 2023-12-21 MED ORDER — PRAVASTATIN SODIUM 40 MG PO TABS: 40.0000 mg | ORAL_TABLET | Freq: Every day | ORAL | Status: DC

## 2023-12-21 MED ORDER — ONDANSETRON HCL 4 MG/2ML IJ SOLN: 4.0000 mg | Freq: Four times a day (QID) | INTRAMUSCULAR | Status: DC | PRN

## 2023-12-21 MED ORDER — CLOPIDOGREL BISULFATE 75 MG PO TABS
75.0000 mg | ORAL_TABLET | Freq: Every day | ORAL | Status: DC
  Administered 2023-12-23 – 2023-12-24 (×2): 75 mg via ORAL
  Filled 2023-12-21 (×2): qty 1

## 2023-12-21 MED ORDER — BENZONATATE 100 MG PO CAPS: 200.0000 mg | ORAL_CAPSULE | Freq: Three times a day (TID) | ORAL | Status: DC | PRN

## 2023-12-21 NOTE — ED Triage Notes (Signed)
 Patient sent from PCP after his diagnosed pneumonia is worse than originally thought. Patient is having some shob. Denies chest pain, fevers, chills. He has taken his first antibiotic and steroid.

## 2023-12-21 NOTE — ED Provider Triage Note (Signed)
 Emergency Medicine Provider Triage Evaluation Note  [name] , a 78 y.o. male  was evaluated in triage.  Pt complains of mild dyspnea, seen by PCP for likely pneumonia and started on Levaquin and prednisone.  Levaquin started this morning.  Sent to the ED by PCP secondary to findings on x-ray concerning for large pleural effusion..  Review of Systems  Positive: As above Negative:   Physical Exam  BP (!) 150/45   Pulse (!) 58   Temp 99.3 F (37.4 C)   Resp 18   Ht 6' 1 (1.854 m)   Wt 96.2 kg   SpO2 96%   BMI 27.97 kg/m  Gen:   Awake, no distress   Resp:  Normal effort, pulmonary auscultation does show dampened lung sounds in the lower left lung fields MSK:   Moves extremities without difficulty  Other:    Medical Decision Making  Medically screening exam initiated at 3:53 PM.  Appropriate orders placed.  [name] was informed that the remainder of the evaluation will be completed by another provider, this initial triage assessment does not replace that evaluation, and the importance of remaining in the ED until their evaluation is complete.  Initial labs and imaging placed   Myriam Dorn [place], [place] 12/21/23 1603

## 2023-12-21 NOTE — ED Triage Notes (Signed)
 Patient has taken one dose of PO levofloxacin 500mg  since being prescribed this morning. Received steroid shot in office as well.

## 2023-12-21 NOTE — ED Provider Notes (Addendum)
 [place] EMERGENCY DEPARTMENT AT [facility] Provider Note   CSN: [id number] Arrival date & time: 12/21/23  1404     Patient presents with: Shortness of Breath   [name] is a 78 y.o. male.    Shortness of Breath Patient presents with shortness of breath.  Mild dyspnea.  Had fall around a month ago.  Had some rib fractures.  Had some shortness of breath around 2 days ago.  Went to urgent care today.  Reportedly diagnosed with pneumonia.  X-ray done and I cannot see the note but it appears that the diagnosis was pleural effusion.  Sent in for further evaluation.  States breathing is not too bad at this point.  However the last 2 weeks has had decreased appetite.  Eating and drinking less.  History of some heart issues and states he hardly uses any salt.     Prior to Admission medications   Medication Sig Start Date End Date Taking? Authorizing Provider  amLODipine  (NORVASC ) 5 MG tablet TAKE ONE TABLET TWICE DAILY 08/13/23   [place], Peter M, MD  aspirin 81 MG tablet Take 81 mg by mouth daily.    [provider]  carbamazepine  (TEGRETOL ) 200 MG tablet TAKE 1 TABLET AT BREAKFAST, LUNCH AND BEDTIME AND 1/2 TABLET WITH DINNER 12/25/22   Millikan, [name], NP  Cholecalciferol (VITAMIN D3) 25 MCG (1000 UT) CAPS Take by mouth.    [provider]  clopidogrel  (PLAVIX ) 75 MG tablet TAKE ONE TABLET DAILY 02/20/23   [place], Peter M, MD  ezetimibe  (ZETIA ) 10 MG tablet Take 1 tablet (10 mg total) by mouth daily. 07/16/23   [place], Peter M, MD  gabapentin (NEURONTIN) 100 MG capsule Take 100 mg by mouth 3 (three) times daily.    [provider]  GEMTESA 75 MG TABS Take 1 tablet by mouth daily. 07/22/22   [provider]  hydrALAZINE  (APRESOLINE ) 50 MG tablet TAKE ONE TABLET BY MOUTH THREE TIMES DAILY 07/22/23   [place], Peter M, MD  lidocaine  (LIDODERM ) 5 % Place 1 patch onto the skin daily. Remove & Discard patch within 12 hours or as directed by MD 11/22/23    Raford Lenis, MD  LIVALO  2 MG TABS TAKE ONE TABLET EACH DAY 01/28/23   [place], Peter M, MD  nebivolol  (BYSTOLIC ) 10 MG tablet TAKE ONE TABLET EVERY DAY 03/05/23   [place], Peter M, MD  neomycin-polymyxin b-dexamethasone (MAXITROL) 3.5-10000-0.1 SUSP Place 1 drop into the left eye 2 (two) times daily. 07/17/23   [provider]  telmisartan (MICARDIS) 80 MG tablet 80 mg.    [provider]  traMADol  (ULTRAM ) 50 MG tablet Take 1 tablet (50 mg total) by mouth every 6 (six) hours as needed. 11/22/23   Raford Lenis, MD    Allergies: Hydrochlorothiazide    Review of Systems  Respiratory:  Positive for shortness of breath.     Updated Vital Signs BP (!) 189/67   Pulse 67   Temp 98.7 F (37.1 C) (Oral)   Resp 14   Ht 6' 1 (1.854 m)   Wt 96.2 kg   SpO2 95%   BMI 27.97 kg/m   Physical Exam Vitals reviewed.  Cardiovascular:     Rate and Rhythm: Regular rhythm.  Pulmonary:     Breath sounds: No wheezing or rhonchi.  Chest:     Chest wall: No tenderness.  Abdominal:     Tenderness: There is no abdominal tenderness.  Musculoskeletal:     Right lower  leg: No edema.     Left lower leg: No edema.  Neurological:     Mental Status: He is alert.     (all labs ordered are listed, but only abnormal results are displayed) Labs Reviewed  BASIC METABOLIC PANEL WITH GFR - Abnormal; Notable for the following components:      Result Value   Sodium 123 (*)    Chloride 91 (*)    Glucose, Bld 109 (*)    Creatinine, Ser 1.39 (*)    Calcium  8.5 (*)    GFR, Estimated 52 (*)    All other components within normal limits  CBC - Abnormal; Notable for the following components:   RBC 3.65 (*)    Hemoglobin 10.5 (*)    HCT 31.3 (*)    All other components within normal limits  I-STAT CHEM 8, ED - Abnormal; Notable for the following components:   Sodium 122 (*)    Chloride 92 (*)    Creatinine, Ser 1.30 (*)    Glucose, Bld 113 (*)    Calcium , Ion 1.10 (*)    TCO2 21 (*)     Hemoglobin 11.6 (*)    HCT 34.0 (*)    All other components within normal limits  CARBAMAZEPINE  LEVEL, TOTAL  HEPATIC FUNCTION PANEL    EKG: None  Radiology: CT Chest W Contrast Result Date: 12/21/2023 EXAM: CT CHEST WITH CONTRAST 12/21/2023 07:28:31 PM TECHNIQUE: CT of the chest was performed with the administration of intravenous contrast. Multiplanar reformatted images are provided for review. Automated exposure control, iterative reconstruction, and/or weight based adjustment of the mA/kV was utilized to reduce the radiation dose to as low as reasonably achievable. COMPARISON: Comparison to prior examination on November 22, 2023. CLINICAL HISTORY: Pleural effusion, malignancy suspected; Rib fracture suspected, traumatic. FINDINGS: MEDIASTINUM: Extensive multivessel coronary artery calcification. Mild cardiomegaly, stable. No pericardial effusion. Central pulmonary arteries were of normal caliber. Thoracic Aorta is unremarkable. Right paratracheal, hilar, and subcarinal adenopathy has developed, likely reactive in nature. No frankly pathologic adenopathy. LYMPH NODES: Right paratracheal, hilar, and subcarinal adenopathy has developed, likely reactive in nature. No frankly pathologic adenopathy. LUNGS AND PLEURA: Left pleural effusion has increased in size and is now large with subtotal collapse of the left lower lobe since the left upper lobe. Scattered ground-glass infiltrates, peribronchial nodular infiltrates, and bronchial wall thickening is seen throughout the right lung, new since prior examination and was suggestive of acute multifocal bronchopneumonia. No pneumothorax. No pleural effusion on the right. Stable scarring within the anterior, basilar right upper lobe. SOFT TISSUES/BONES: No acute abnormality of the bones or soft tissues. UPPER ABDOMEN: No acute abnormality within the visualized upper abdomen. IMPRESSION: 1. Large left pleural effusion with subtotal collapse of the left lower lobe  and left upper lobe. 2. Acute multifocal bronchopneumonia in the right lung. 3. Right paratracheal, hilar, and subcarinal adenopathy, likely reactive in nature. No frankly pathologic adenopathy. Electronically signed by: Dorethia Molt MD 12/21/2023 07:51 PM EDT RP Workstation: [id number]   DG Chest 2 View Result Date: 12/21/2023 CLINICAL DATA:  shortness of breath EXAM: CHEST - 2 VIEW COMPARISON:  Chest x-ray 12/01/2022, CT chest 11/22/2023. FINDINGS: The heart and mediastinal contours are unchanged. Atherosclerotic plaque. No focal consolidation. No pulmonary edema. Interval increase in moderate left pleural effusion. No right pleural effusion. No pneumothorax. No acute osseous abnormality. Right acromioclavicular joint degenerative changes. IMPRESSION: Interval increase in moderate left pleural effusion. Electronically Signed   By: Morgane  Naveau M.D.   On: 12/21/2023  17:05     Procedures   Medications Ordered in the ED  iohexol  (OMNIPAQUE ) 350 MG/ML injection 50 mL (50 mLs Intravenous Contrast Given 12/21/23 1928)                                    Medical Decision Making Amount and/or Complexity of Data Reviewed Labs: ordered. Radiology: ordered.  Risk Prescription drug management. Decision regarding hospitalization.   Patient with shortness of breath.  Also some decreased appetite.  Differential diagnoses longed include causes such as pneumonia.  However I reviewed the x-ray and does show likely pleural effusion.  Enlarged since fall.  Could be hemothorax or potentially reactive from the rib fractures.  Mass and infection also considered.  Discussed with radiologist and will get CT scan with contrast to further evaluate.  Also hyponatremia.  May be due to decreased oral intake and decreased sodium intake overall.  Also carbamazepine  can cause hypotension natremia.  Will get level.  Recheck of sodium does show hyponatremia of 122.  I think with this would benefit of admission  to the hospital.  Carbamazepine  level still pending.  CT shows potential pneumonia on the right side although not coughing.  Large effusion on the left.  I think with the large effusion also would benefit for admission to hospital for further evaluation.  Will discuss with hospitalist.  Carbamazepine  level now came back mildly elevated at 12.9.      Final diagnoses:  Hyponatremia  Pleural effusion    ED Discharge Orders     None          Patsey Lot, MD 12/21/23 2013    Patsey Lot, MD 12/21/23 2037

## 2023-12-21 NOTE — H&P (Signed)
 History and Physical      [name] FMW:[id number] DOB: 1945/07/24 DOA: 12/21/2023; DOS: 12/21/2023  PCP: Shepard Ade, MD *** Patient coming from: home ***  I have personally briefly reviewed patient's old medical records in [facility]  Chief Complaint: ***  HPI: [name] is a 78 y.o. male with medical history significant for *** who is admitted to [facility] on 12/21/2023 with *** after presenting from home*** to [facility] ED complaining of ***.    ***       ***   ED Course:  Vital signs in the ED were notable for the following: ***  Labs were notable for the following: ***  Per my interpretation, EKG in ED demonstrated the following:  ***  Imaging in the ED, per corresponding formal radiology read, was notable for the following:  ***  While in the ED, the following were administered: ***  Subsequently, the patient was admitted  ***  ***red    Review of Systems: As per HPI otherwise 10 point review of systems negative.   Past Medical History:  Diagnosis Date   Cervical spine disease    History of cervical spine disease   Coronary artery disease    s/p extensive stenting of the mid to distal LAD with 3 Cypher stents and past stenting of the mid RCA. He does have a diagonal branch that was jailed. He has stable angina. Negative myoview  in October of 2012; Managed medically.    Hypercholesterolemia    Hypertension    Knee pain    Seizure disorder (HCC)    Sleep apnea    use to wear cpap   Vagal reaction     Past Surgical History:  Procedure Laterality Date   COLONOSCOPY     coronary artery stent placement     HERNIA REPAIR     right groin   SHOULDER ARTHROSCOPY     right   VASECTOMY      Social History:  reports that he quit smoking about 58 years ago. His smoking use included cigarettes. He has never used smokeless tobacco. He reports current alcohol  use. He reports that he does not use drugs.   Allergies  Allergen  Reactions   Hydrochlorothiazide Other (See Comments)    Visual changes with higher dosage.    Family History  Problem Relation Age of Onset   Hyperlipidemia Mother    Heart failure Father 103   Colon cancer Neg Hx    Seizures Neg Hx     Family history reviewed and not pertinent ***   Prior to Admission medications   Medication Sig Start Date End Date Taking? Authorizing Provider  amLODipine  (NORVASC ) 5 MG tablet TAKE ONE TABLET TWICE DAILY 08/13/23   [place], Peter M, MD  aspirin 81 MG tablet Take 81 mg by mouth daily.    [provider]  carbamazepine  (TEGRETOL ) 200 MG tablet TAKE 1 TABLET AT BREAKFAST, LUNCH AND BEDTIME AND 1/2 TABLET WITH DINNER 12/25/22   Millikan, [name], NP  Cholecalciferol (VITAMIN D3) 25 MCG (1000 UT) CAPS Take by mouth.    [provider]  clopidogrel  (PLAVIX ) 75 MG tablet TAKE ONE TABLET DAILY 02/20/23   [place], Peter M, MD  ezetimibe  (ZETIA ) 10 MG tablet Take 1 tablet (10 mg total) by mouth daily. 07/16/23   [place], Peter M, MD  gabapentin (NEURONTIN) 100 MG capsule Take 100 mg by mouth 3 (three) times daily.    [provider]  GEMTESA 75  MG TABS Take 1 tablet by mouth daily. 07/22/22   [provider]  hydrALAZINE  (APRESOLINE ) 50 MG tablet TAKE ONE TABLET BY MOUTH THREE TIMES DAILY 07/22/23   [place], Peter M, MD  lidocaine  (LIDODERM ) 5 % Place 1 patch onto the skin daily. Remove & Discard patch within 12 hours or as directed by MD 11/22/23   Raford Lenis, MD  LIVALO  2 MG TABS TAKE ONE TABLET EACH DAY 01/28/23   [place], Peter M, MD  nebivolol  (BYSTOLIC ) 10 MG tablet TAKE ONE TABLET EVERY DAY 03/05/23   [place], Peter M, MD  neomycin-polymyxin b-dexamethasone (MAXITROL) 3.5-10000-0.1 SUSP Place 1 drop into the left eye 2 (two) times daily. 07/17/23   [provider]  telmisartan (MICARDIS) 80 MG tablet 80 mg.    [provider]  traMADol  (ULTRAM ) 50 MG tablet Take 1 tablet (50 mg total) by mouth every 6 (six) hours as  needed. 11/22/23   Raford Lenis, MD     Objective    Physical Exam: Vitals:   12/21/23 1957 12/21/23 2030 12/21/23 2031 12/21/23 2032  BP:  (!) 162/62    Pulse:  61 65 64  Resp:  12 14 14   Temp:      TempSrc:      SpO2: 95% 98% 95% 96%  Weight:      Height:        General: appears to be stated age; alert, oriented Skin: warm, dry, no rash Head:  AT/[place] Mouth:  Oral mucosa membranes appear moist, normal dentition Neck: supple; trachea midline Heart:  RRR; did not appreciate any M/R/G Lungs: CTAB, did not appreciate any wheezes, rales, or rhonchi Abdomen: + BS; soft, ND, NT Vascular: 2+ pedal pulses b/l; 2+ radial pulses b/l Extremities: no peripheral edema, no muscle wasting Neuro: strength and sensation intact in upper and lower extremities b/l ***   *** Neuro: 5/5 strength of the proximal and distal flexors and extensors of the upper and lower extremities bilaterally; sensation intact in upper and lower extremities b/l; cranial nerves II through XII grossly intact; no pronator drift; no evidence suggestive of slurred speech, dysarthria, or facial droop; Normal muscle tone. No tremors.  *** Neuro: In the setting of the patient's current mental status and associated inability to follow instructions, unable to perform full neurologic exam at this time.  As such, assessment of strength, sensation, and cranial nerves is limited at this time. Patient noted to spontaneously move all 4 extremities. No tremors.  ***    Labs on Admission: I have personally reviewed following labs and imaging studies  CBC: Recent Labs  Lab 12/21/23 1552 12/21/23 1910  WBC 10.5  --   HGB 10.5* 11.6*  HCT 31.3* 34.0*  MCV 85.8  --   PLT 291  --    Basic Metabolic Panel: Recent Labs  Lab 12/21/23 1552 12/21/23 1910  NA 123* 122*  K 4.5 4.6  CL 91* 92*  CO2 22  --   GLUCOSE 109* 113*  BUN 20 20  CREATININE 1.39* 1.30*  CALCIUM  8.5*  --    GFR: Estimated Creatinine Clearance: 56.3  mL/min (A) (by C-G formula based on SCr of 1.3 mg/dL (H)). Liver Function Tests: Recent Labs  Lab 12/21/23 1902  AST 25  ALT 18  ALKPHOS 87  BILITOT 0.5  PROT 6.7  ALBUMIN 3.4*   No results for input(s): LIPASE, AMYLASE in the last 168 hours. No results for input(s): AMMONIA in the last 168 hours. Coagulation Profile: No results  for input(s): INR, PROTIME in the last 168 hours. Cardiac Enzymes: No results for input(s): CKTOTAL, CKMB, CKMBINDEX, TROPONINI in the last 168 hours. BNP (last 3 results) No results for input(s): PROBNP in the last 8760 hours. HbA1C: No results for input(s): HGBA1C in the last 72 hours. CBG: No results for input(s): GLUCAP in the last 168 hours. Lipid Profile: No results for input(s): CHOL, HDL, LDLCALC, TRIG, CHOLHDL, LDLDIRECT in the last 72 hours. Thyroid  Function Tests: No results for input(s): TSH, T4TOTAL, FREET4, T3FREE, THYROIDAB in the last 72 hours. Anemia Panel: No results for input(s): VITAMINB12, FOLATE, FERRITIN, TIBC, IRON, RETICCTPCT in the last 72 hours. Urine analysis: No results found for: COLORURINE, APPEARANCEUR, LABSPEC, PHURINE, GLUCOSEU, HGBUR, BILIRUBINUR, KETONESUR, PROTEINUR, UROBILINOGEN, NITRITE, LEUKOCYTESUR  Radiological Exams on Admission: CT Chest W Contrast Result Date: 12/21/2023 EXAM: CT CHEST WITH CONTRAST 12/21/2023 07:28:31 PM TECHNIQUE: CT of the chest was performed with the administration of intravenous contrast. Multiplanar reformatted images are provided for review. Automated exposure control, iterative reconstruction, and/or weight based adjustment of the mA/kV was utilized to reduce the radiation dose to as low as reasonably achievable. COMPARISON: Comparison to prior examination on November 22, 2023. CLINICAL HISTORY: Pleural effusion, malignancy suspected; Rib fracture suspected, traumatic. FINDINGS: MEDIASTINUM: Extensive  multivessel coronary artery calcification. Mild cardiomegaly, stable. No pericardial effusion. Central pulmonary arteries were of normal caliber. Thoracic Aorta is unremarkable. Right paratracheal, hilar, and subcarinal adenopathy has developed, likely reactive in nature. No frankly pathologic adenopathy. LYMPH NODES: Right paratracheal, hilar, and subcarinal adenopathy has developed, likely reactive in nature. No frankly pathologic adenopathy. LUNGS AND PLEURA: Left pleural effusion has increased in size and is now large with subtotal collapse of the left lower lobe since the left upper lobe. Scattered ground-glass infiltrates, peribronchial nodular infiltrates, and bronchial wall thickening is seen throughout the right lung, new since prior examination and was suggestive of acute multifocal bronchopneumonia. No pneumothorax. No pleural effusion on the right. Stable scarring within the anterior, basilar right upper lobe. SOFT TISSUES/BONES: No acute abnormality of the bones or soft tissues. UPPER ABDOMEN: No acute abnormality within the visualized upper abdomen. IMPRESSION: 1. Large left pleural effusion with subtotal collapse of the left lower lobe and left upper lobe. 2. Acute multifocal bronchopneumonia in the right lung. 3. Right paratracheal, hilar, and subcarinal adenopathy, likely reactive in nature. No frankly pathologic adenopathy. Electronically signed by: Dorethia Molt MD 12/21/2023 07:51 PM EDT RP Workstation: [id number]   DG Chest 2 View Result Date: 12/21/2023 CLINICAL DATA:  shortness of breath EXAM: CHEST - 2 VIEW COMPARISON:  Chest x-ray 12/01/2022, CT chest 11/22/2023. FINDINGS: The heart and mediastinal contours are unchanged. Atherosclerotic plaque. No focal consolidation. No pulmonary edema. Interval increase in moderate left pleural effusion. No right pleural effusion. No pneumothorax. No acute osseous abnormality. Right acromioclavicular joint degenerative changes. IMPRESSION: Interval  increase in moderate left pleural effusion. Electronically Signed   By: Morgane  Naveau M.D.   On: 12/21/2023 17:05      Assessment/Plan   Principal Problem:   Acute hyponatremia   ***            ***      #) *** pleural effusion: CTA chest shows evidence of a moderate-sized right pleural effusion associated with atelectasis of the right lower lobe, representing a new diagnosis for the patient.  Concerning given additional radiographic findings suggestive of metastatic disease, as further described above.  Suspect contribution to presenting shortness of breath ensuing acute COPD exacerbation relating to this moderate-sized  right pleural effusion, particular in the setting of apparent sequela atelectasis of the right lower lobe.  Consequently, will try to pursue thoracentesis for symptomatic as well as diagnostic purposes.  Not on any blood thinners at home, including aspirin.   ***a/w compressive atelectasis? ***respiratory status?     Plan: ***-sided pleural fluid analysis has been ordered for application at time of intended ensuing right thoracentesis.  These pleural fluid studies including the following: Culture and Gram stain, cell count with differential, cytology, LDH, amylase, glucose, protein, triglyceride, pH. check serum LDH for further evaluation via lights criteria.  CMP tomorrow, including for serum total protein..  Monitor continuous pulse oximetry monitor on telemetry.  Check INR. Will consult IR for diagnostic/therapeutic right-sided thoracentesis.   *** the following is the specific order that needs to be placed in order to get thoracentesis by IR (it is considered an imaging study: IR THORACENTESIS ASP PLEURAL SPACE W/IMG GUIDE (NOT US  Thoracentesis Asp Pleural Space w/Img Guide).   *** of note: the pleural fluid analysis studies need to be specified with the above Thoracentesis imaging order (and should not be ordered as separate lab orders).    ***of  note, for paracentesis, I previously placed order for US  paracentesis, although it's possible that the order may need to be IR paracentesis (i'm still not sure).              ***                   ***                  ***                  ***                  ***                   ***                  ***                  ***                  ***                  ***                 ***                ***  DVT prophylaxis: SCD's ***  Code Status: Full code*** Family Communication: none*** Disposition Plan: Per Rounding Team Consults called: none***;  Admission status: ***     I SPENT GREATER THAN 75 *** MINUTES IN CLINICAL CARE TIME/MEDICAL DECISION-MAKING IN COMPLETING THIS ADMISSION.      Eva NOVAK Gaelle Adriance DO [facility]  From 7PM - 7AM   12/21/2023, 9:02 PM   ***

## 2023-12-22 ENCOUNTER — Inpatient Hospital Stay (HOSPITAL_COMMUNITY)

## 2023-12-22 DIAGNOSIS — J90 Pleural effusion, not elsewhere classified: Secondary | ICD-10-CM | POA: Diagnosis not present

## 2023-12-22 DIAGNOSIS — R188 Other ascites: Secondary | ICD-10-CM | POA: Diagnosis not present

## 2023-12-22 DIAGNOSIS — J939 Pneumothorax, unspecified: Secondary | ICD-10-CM | POA: Diagnosis not present

## 2023-12-22 DIAGNOSIS — I509 Heart failure, unspecified: Secondary | ICD-10-CM | POA: Diagnosis not present

## 2023-12-22 DIAGNOSIS — J189 Pneumonia, unspecified organism: Secondary | ICD-10-CM | POA: Diagnosis present

## 2023-12-22 DIAGNOSIS — R0602 Shortness of breath: Secondary | ICD-10-CM | POA: Diagnosis present

## 2023-12-22 DIAGNOSIS — Z87898 Personal history of other specified conditions: Secondary | ICD-10-CM

## 2023-12-22 DIAGNOSIS — D649 Anemia, unspecified: Secondary | ICD-10-CM | POA: Diagnosis present

## 2023-12-22 DIAGNOSIS — Z48813 Encounter for surgical aftercare following surgery on the respiratory system: Secondary | ICD-10-CM | POA: Diagnosis not present

## 2023-12-22 HISTORY — PX: IR THORACENTESIS ASP PLEURAL SPACE W/IMG GUIDE: IMG5380

## 2023-12-22 LAB — URINALYSIS, COMPLETE (UACMP) WITH MICROSCOPIC
Bacteria, UA: NONE SEEN
Bilirubin Urine: NEGATIVE
Glucose, UA: NEGATIVE mg/dL
Ketones, ur: NEGATIVE mg/dL
Leukocytes,Ua: NEGATIVE
Nitrite: NEGATIVE
Protein, ur: 100 mg/dL — AB
Specific Gravity, Urine: 1.016 (ref 1.005–1.030)
pH: 6 (ref 5.0–8.0)

## 2023-12-22 LAB — LACTATE DEHYDROGENASE, PLEURAL OR PERITONEAL FLUID: LD, Fluid: 96 U/L — ABNORMAL HIGH (ref 3–23)

## 2023-12-22 LAB — PROTEIN, PLEURAL OR PERITONEAL FLUID: Total protein, fluid: 3.6 g/dL

## 2023-12-22 LAB — CBC WITH DIFFERENTIAL/PLATELET
Abs Immature Granulocytes: 0.05 K/uL (ref 0.00–0.07)
Basophils Absolute: 0 K/uL (ref 0.0–0.1)
Basophils Relative: 0 %
Eosinophils Absolute: 0.1 K/uL (ref 0.0–0.5)
Eosinophils Relative: 1 %
HCT: 29.4 % — ABNORMAL LOW (ref 39.0–52.0)
Hemoglobin: 9.8 g/dL — ABNORMAL LOW (ref 13.0–17.0)
Immature Granulocytes: 1 %
Lymphocytes Relative: 13 %
Lymphs Abs: 1.2 K/uL (ref 0.7–4.0)
MCH: 29 pg (ref 26.0–34.0)
MCHC: 33.3 g/dL (ref 30.0–36.0)
MCV: 87 fL (ref 80.0–100.0)
Monocytes Absolute: 1.3 K/uL — ABNORMAL HIGH (ref 0.1–1.0)
Monocytes Relative: 14 %
Neutro Abs: 6.4 K/uL (ref 1.7–7.7)
Neutrophils Relative %: 71 %
Platelets: 280 K/uL (ref 150–400)
RBC: 3.38 MIL/uL — ABNORMAL LOW (ref 4.22–5.81)
RDW: 13.3 % (ref 11.5–15.5)
WBC: 9 K/uL (ref 4.0–10.5)
nRBC: 0 % (ref 0.0–0.2)

## 2023-12-22 LAB — COMPREHENSIVE METABOLIC PANEL WITH GFR
ALT: 16 U/L (ref 0–44)
AST: 19 U/L (ref 15–41)
Albumin: 2.9 g/dL — ABNORMAL LOW (ref 3.5–5.0)
Alkaline Phosphatase: 75 U/L (ref 38–126)
Anion gap: 12 (ref 5–15)
BUN: 19 mg/dL (ref 8–23)
CO2: 21 mmol/L — ABNORMAL LOW (ref 22–32)
Calcium: 7.9 mg/dL — ABNORMAL LOW (ref 8.9–10.3)
Chloride: 91 mmol/L — ABNORMAL LOW (ref 98–111)
Creatinine, Ser: 1.38 mg/dL — ABNORMAL HIGH (ref 0.61–1.24)
GFR, Estimated: 52 mL/min — ABNORMAL LOW (ref >60)
Glucose, Bld: 104 mg/dL — ABNORMAL HIGH (ref 70–99)
Potassium: 4.4 mmol/L (ref 3.5–5.1)
Sodium: 124 mmol/L — ABNORMAL LOW (ref 135–145)
Total Bilirubin: 0.4 mg/dL (ref 0.0–1.2)
Total Protein: 5.7 g/dL — ABNORMAL LOW (ref 6.5–8.1)

## 2023-12-22 LAB — TSH: TSH: 1.053 u[IU]/mL (ref 0.350–4.500)

## 2023-12-22 LAB — ECHOCARDIOGRAM COMPLETE
Area-P 1/2: 3.48 cm2
Calc EF: 65.2 %
Height: 73 in
S' Lateral: 2.8 cm
Single Plane A2C EF: 68.5 %
Single Plane A4C EF: 63.9 %
Weight: 3392 [oz_av]

## 2023-12-22 LAB — RETICULOCYTES
Immature Retic Fract: 6.7 % (ref 2.3–15.9)
RBC.: 3.35 MIL/uL — ABNORMAL LOW (ref 4.22–5.81)
Retic Count, Absolute: 37.9 K/uL (ref 19.0–186.0)
Retic Ct Pct: 1.1 % (ref 0.4–3.1)

## 2023-12-22 LAB — BODY FLUID CELL COUNT WITH DIFFERENTIAL
Eos, Fluid: 0 %
Lymphs, Fluid: 13 %
Monocyte-Macrophage-Serous Fluid: 75 % (ref 50–90)
Neutrophil Count, Fluid: 12 % (ref 0–25)
Total Nucleated Cell Count, Fluid: 1175 uL — ABNORMAL HIGH (ref 0–1000)

## 2023-12-22 LAB — CARBAMAZEPINE LEVEL, TOTAL: Carbamazepine Lvl: 11 ug/mL (ref 4.0–12.0)

## 2023-12-22 LAB — GLUCOSE, PLEURAL OR PERITONEAL FLUID: Glucose, Fluid: 118 mg/dL

## 2023-12-22 LAB — CREATININE, URINE, RANDOM: Creatinine, Urine: 58 mg/dL

## 2023-12-22 LAB — MAGNESIUM
Magnesium: 1.9 mg/dL (ref 1.7–2.4)
Magnesium: 1.9 mg/dL (ref 1.7–2.4)

## 2023-12-22 LAB — FOLATE: Folate: 8 ng/mL (ref >5.9)

## 2023-12-22 LAB — PROTIME-INR
INR: 1.4 — ABNORMAL HIGH (ref 0.8–1.2)
Prothrombin Time: 17.5 s — ABNORMAL HIGH (ref 11.4–15.2)

## 2023-12-22 LAB — OSMOLALITY: Osmolality: 262 mosm/kg — ABNORMAL LOW (ref 275–295)

## 2023-12-22 LAB — IRON AND TIBC
Iron: 28 ug/dL — ABNORMAL LOW (ref 45–182)
Saturation Ratios: 13 % — ABNORMAL LOW (ref 17.9–39.5)
TIBC: 221 ug/dL — ABNORMAL LOW (ref 250–450)
UIBC: 193 ug/dL

## 2023-12-22 LAB — URIC ACID: Uric Acid, Serum: 5.1 mg/dL (ref 3.7–8.6)

## 2023-12-22 LAB — ETHANOL: Alcohol, Ethyl (B): <15 mg/dL (ref <15)

## 2023-12-22 LAB — STREP PNEUMONIAE URINARY ANTIGEN: Strep Pneumo Urinary Antigen: NEGATIVE

## 2023-12-22 LAB — SODIUM, URINE, RANDOM: Sodium, Ur: 34 mmol/L

## 2023-12-22 LAB — OSMOLALITY, URINE: Osmolality, Ur: 277 mosm/kg — ABNORMAL LOW (ref 300–900)

## 2023-12-22 LAB — LACTATE DEHYDROGENASE: LDH: 167 U/L (ref 98–192)

## 2023-12-22 LAB — PROCALCITONIN: Procalcitonin: <0.1 ng/mL

## 2023-12-22 LAB — BRAIN NATRIURETIC PEPTIDE: B Natriuretic Peptide: 347.4 pg/mL — ABNORMAL HIGH (ref 0.0–100.0)

## 2023-12-22 LAB — APTT: aPTT: 39 s — ABNORMAL HIGH (ref 24–36)

## 2023-12-22 LAB — PHOSPHORUS: Phosphorus: 3.7 mg/dL (ref 2.5–4.6)

## 2023-12-22 LAB — VITAMIN B12: Vitamin B-12: 216 pg/mL (ref 180–914)

## 2023-12-22 LAB — FERRITIN: Ferritin: 214 ng/mL (ref 24–336)

## 2023-12-22 MED ORDER — CARBAMAZEPINE 100 MG PO CHEW
100.0000 mg | CHEWABLE_TABLET | Freq: Every day | ORAL | Status: DC
  Administered 2023-12-22: 100 mg via ORAL
  Filled 2023-12-22 (×2): qty 1

## 2023-12-22 MED ORDER — SODIUM CHLORIDE 0.9 % IV SOLN
2.0000 g | INTRAVENOUS | Status: DC
  Administered 2023-12-22 – 2023-12-23 (×2): 2 g via INTRAVENOUS
  Filled 2023-12-22 (×2): qty 20

## 2023-12-22 MED ORDER — LIDOCAINE HCL (PF) 1 % IJ SOLN: 10.0000 mL | Freq: Once | INTRAMUSCULAR | Status: DC

## 2023-12-22 MED ORDER — LIDOCAINE HCL 1 % IJ SOLN
INTRAMUSCULAR | Status: AC
  Filled 2023-12-22: qty 20

## 2023-12-22 MED ORDER — CARBAMAZEPINE 200 MG PO TABS
200.0000 mg | ORAL_TABLET | Freq: Three times a day (TID) | ORAL | Status: DC
  Administered 2023-12-22 – 2023-12-23 (×5): 200 mg via ORAL
  Filled 2023-12-22 (×7): qty 1

## 2023-12-22 MED ORDER — PRAVASTATIN SODIUM 40 MG PO TABS
40.0000 mg | ORAL_TABLET | Freq: Every day | ORAL | Status: DC
  Administered 2023-12-22 – 2023-12-24 (×3): 40 mg via ORAL
  Filled 2023-12-22 (×3): qty 1

## 2023-12-22 MED ORDER — CARBAMAZEPINE 200 MG PO TABS
200.0000 mg | ORAL_TABLET | Freq: Once | ORAL | Status: AC
  Administered 2023-12-22: 200 mg via ORAL
  Filled 2023-12-22 (×2): qty 1

## 2023-12-22 MED ORDER — NEBIVOLOL HCL 5 MG PO TABS
5.0000 mg | ORAL_TABLET | Freq: Every day | ORAL | Status: DC
  Administered 2023-12-22 – 2023-12-23 (×2): 5 mg via ORAL
  Filled 2023-12-22 (×3): qty 1

## 2023-12-22 MED ORDER — SODIUM CHLORIDE 0.9 % IV SOLN: 1.0000 g | INTRAVENOUS | Status: DC

## 2023-12-22 MED ORDER — SODIUM CHLORIDE 0.9 % IV SOLN: 2.0000 g | INTRAVENOUS | Status: DC

## 2023-12-22 MED ORDER — GABAPENTIN 100 MG PO CAPS
100.0000 mg | ORAL_CAPSULE | Freq: Every day | ORAL | Status: DC
Start: 2023-12-22 — End: 2023-12-24
  Administered 2023-12-23: 100 mg via ORAL
  Filled 2023-12-22 (×2): qty 1

## 2023-12-22 MED ORDER — ASPIRIN 81 MG PO TBEC
81.0000 mg | DELAYED_RELEASE_TABLET | Freq: Every day | ORAL | Status: DC
Start: 2023-12-22 — End: 2023-12-24
  Administered 2023-12-22 – 2023-12-23 (×2): 81 mg via ORAL
  Filled 2023-12-22 (×2): qty 1

## 2023-12-22 NOTE — Progress Notes (Signed)
  Echocardiogram 2D Echocardiogram has been performed.  Norleen ORN [facility] 12/22/2023, 2:33 PM

## 2023-12-22 NOTE — ED Notes (Signed)
 Introduced self to patient at this time. Patient is resting in bed with visible chest rise and fall. The call light is in reach. There are no further requests at this time.

## 2023-12-22 NOTE — Procedures (Signed)
 PROCEDURE SUMMARY:  Successful image-guided left-sided diagnostic and therapeutic thoracentesis. Yielded 1.5 liters of clear, blood-laden pleural fluid. Patient tolerated procedure well. EBL: Zero No immediate complications.  Specimen was sent for labs. Post procedure CXR is pending.  Please see imaging section of Epic for full dictation.  Carlin LABOR Cleofas Hudgins PA-C 12/22/2023 9:29 AM

## 2023-12-22 NOTE — ED Notes (Signed)
 Modified patient antibiotic medication orders as per Dino, MD.   Gabapentin order modified per patient request to reflect his normal home dosing.

## 2023-12-22 NOTE — Progress Notes (Addendum)
  Progress Note   Patient: [name] FMW:[id number] DOB: [birth year] DOA: 12/21/2023     1 DOS: the patient was seen and examined on 12/22/2023   Brief hospital course: 78 y.o. male with medical history significant for coronary artery disease status post PCI with stent placement to the LAD and RCA, hyperlipidemia, essential pretension, seizures, urinary incontinence, who is admitted to [facility] with left pleural effusion. He underwent left sided thoracentesis on 10/7 with removal of 1.5 L of bloody pleural fluid. F/u ECHO He is from home and lives with his wife.  Assessment and Plan:  Left sided pleural effusion: -likely traumatic hemothorax -s/p 1.5 L bloody pleural fluid drainage by IR on 10/7 -I reviewed the CXR done post thoracentesis and it shows a reduction in the amount of pleural fluid and no evidence of pneumothorax. -F/u repeat CXR in am -f/u fluid studies  Acute on subacute hypo-osmolar hyponatremia:  Sodium level today is 124 up from 122. -f/u BMP in am  Possible bacterial b/l multifocal community acquired pneumonia,POA: -Continue with antibiotics -tested negative fro RSV, COVID and influenza panel. -No evidence of hypoxia.  Normocytic anemia: No acute issues. Likely in the setting of hemothorax  Hyperlipidemia: Continue meds  Hypertension: continue monitoring  Seizures: On tegretol    Disposition: Lives at home with his wife     Subjective: He underwent left sided thoracentesis this morning with removal of 1.5 L of bloody pleural fluid. He feels better but does have some chest discomfort when he takes a deep breath. His wife is present at the bedside.   Physical Exam: Vitals:   12/22/23 0545 12/22/23 0645 12/22/23 0800 12/22/23 0900  BP: (!) 186/75 (!) 162/53 (!) 181/67 (!) 151/58  Pulse: (!) 56 (!) 59 (!) 57 (!) 55  Resp: 13 15 12 14   Temp:   98.7 F (37.1 C)   TempSrc:   Oral   SpO2: 97% 94% 97% 97%  Weight:      Height:        Constitutional: NAD, calm, comfortable Eyes: PERRL, lids and conjunctivae normal ENMT: Mucous membranes are moist. Posterior pharynx clear of any exudate or lesions.Normal dentition.  Neck: normal, supple, no masses, no thyromegaly Respiratory: clear to auscultation bilaterally, no wheezing, no crackles. Normal respiratory effort. No accessory muscle use.  Cardiovascular: Regular rate and rhythm, no murmurs / rubs / gallops. No extremity edema. 2+ pedal pulses. No carotid bruits.  Abdomen: no tenderness, no masses palpated. No hepatosplenomegaly. Bowel sounds positive.  Musculoskeletal: no clubbing / cyanosis. No joint deformity upper and lower extremities. Good ROM, no contractures. Normal muscle tone.  Skin: no rashes, lesions, ulcers. No induration Neurologic: CN 2-12 grossly intact. Sensation intact, DTR normal. Strength 5/5 x all 4 extremities.  Psychiatric: Normal judgment and insight. Alert and oriented x 3. Normal mood.   Data Reviewed:  There are no new results to review at this time.  Family Communication: Wife at the bedside  Disposition: Home Status is: Inpatient Remains inpatient appropriate because: pleural effusion, pending ECHO   Planned Discharge Destination: Home    Time spent: 41 minutes  Author: Deliliah Room, MD 12/22/2023 12:36 PM  For on call review [email or web address].

## 2023-12-22 NOTE — ED Notes (Signed)
 Spoke with provider [place] regarding the thoracentesis procedure.

## 2023-12-22 NOTE — ED Notes (Signed)
 Pt taken to IR at this time.

## 2023-12-23 ENCOUNTER — Telehealth: Payer: Self-pay | Admitting: Adult Health

## 2023-12-23 ENCOUNTER — Inpatient Hospital Stay (HOSPITAL_COMMUNITY)

## 2023-12-23 DIAGNOSIS — J90 Pleural effusion, not elsewhere classified: Secondary | ICD-10-CM | POA: Diagnosis not present

## 2023-12-23 LAB — BASIC METABOLIC PANEL WITH GFR
Anion gap: 11 (ref 5–15)
BUN: 22 mg/dL (ref 8–23)
CO2: 21 mmol/L — ABNORMAL LOW (ref 22–32)
Calcium: 8.1 mg/dL — ABNORMAL LOW (ref 8.9–10.3)
Chloride: 94 mmol/L — ABNORMAL LOW (ref 98–111)
Creatinine, Ser: 1.25 mg/dL — ABNORMAL HIGH (ref 0.61–1.24)
GFR, Estimated: 59 mL/min — ABNORMAL LOW (ref >60)
Glucose, Bld: 105 mg/dL — ABNORMAL HIGH (ref 70–99)
Potassium: 4.1 mmol/L (ref 3.5–5.1)
Sodium: 126 mmol/L — ABNORMAL LOW (ref 135–145)

## 2023-12-23 LAB — CBC WITH DIFFERENTIAL/PLATELET
Abs Immature Granulocytes: 0.05 K/uL (ref 0.00–0.07)
Basophils Absolute: 0 K/uL (ref 0.0–0.1)
Basophils Relative: 0 %
Eosinophils Absolute: 0.2 K/uL (ref 0.0–0.5)
Eosinophils Relative: 2 %
HCT: 30.2 % — ABNORMAL LOW (ref 39.0–52.0)
Hemoglobin: 10.3 g/dL — ABNORMAL LOW (ref 13.0–17.0)
Immature Granulocytes: 1 %
Lymphocytes Relative: 12 %
Lymphs Abs: 1 K/uL (ref 0.7–4.0)
MCH: 29.1 pg (ref 26.0–34.0)
MCHC: 34.1 g/dL (ref 30.0–36.0)
MCV: 85.3 fL (ref 80.0–100.0)
Monocytes Absolute: 1.1 K/uL — ABNORMAL HIGH (ref 0.1–1.0)
Monocytes Relative: 13 %
Neutro Abs: 5.6 K/uL (ref 1.7–7.7)
Neutrophils Relative %: 72 %
Platelets: 279 K/uL (ref 150–400)
RBC: 3.54 MIL/uL — ABNORMAL LOW (ref 4.22–5.81)
RDW: 13.4 % (ref 11.5–15.5)
WBC: 7.9 K/uL (ref 4.0–10.5)
nRBC: 0 % (ref 0.0–0.2)

## 2023-12-23 LAB — TRIGLYCERIDES, BODY FLUIDS: Triglycerides, Fluid: 28 mg/dL

## 2023-12-23 LAB — PH, BODY FLUID: pH, Body Fluid: 7.6

## 2023-12-23 LAB — MYCOPLASMA PNEUMONIAE ANTIBODY, IGM: Mycoplasma pneumo IgM: <770 U/mL (ref 0–769)

## 2023-12-23 LAB — CYTOLOGY - NON PAP

## 2023-12-23 LAB — LEGIONELLA PNEUMOPHILA SEROGP 1 UR AG: L. pneumophila Serogp 1 Ur Ag: NEGATIVE

## 2023-12-23 MED ORDER — CARBAMAZEPINE 100 MG PO CHEW
100.0000 mg | CHEWABLE_TABLET | Freq: Every day | ORAL | Status: DC
  Administered 2023-12-23: 100 mg via ORAL
  Filled 2023-12-23 (×2): qty 1

## 2023-12-23 MED ORDER — CARBAMAZEPINE 200 MG PO TABS
200.0000 mg | ORAL_TABLET | Freq: Three times a day (TID) | ORAL | Status: DC
Start: 2023-12-23 — End: 2023-12-23
  Administered 2023-12-23: 200 mg via ORAL
  Filled 2023-12-23 (×3): qty 1

## 2023-12-23 MED ORDER — SERTRALINE HCL 50 MG PO TABS
50.0000 mg | ORAL_TABLET | Freq: Every morning | ORAL | Status: DC
  Administered 2023-12-23 – 2023-12-24 (×2): 50 mg via ORAL
  Filled 2023-12-23 (×2): qty 1

## 2023-12-23 MED ORDER — ACETAMINOPHEN 500 MG PO TABS
1000.0000 mg | ORAL_TABLET | Freq: Four times a day (QID) | ORAL | Status: DC | PRN
Start: 2023-12-23 — End: 2023-12-24

## 2023-12-23 MED ORDER — IRBESARTAN 75 MG PO TABS
75.0000 mg | ORAL_TABLET | Freq: Every day | ORAL | Status: DC
  Administered 2023-12-23 – 2023-12-24 (×2): 75 mg via ORAL
  Filled 2023-12-23 (×2): qty 1

## 2023-12-23 MED ORDER — MIRABEGRON ER 25 MG PO TB24
25.0000 mg | ORAL_TABLET | Freq: Every day | ORAL | Status: DC
  Administered 2023-12-23 – 2023-12-24 (×2): 25 mg via ORAL
  Filled 2023-12-23 (×2): qty 1

## 2023-12-23 MED ORDER — CARBAMAZEPINE 200 MG PO TABS
200.0000 mg | ORAL_TABLET | ORAL | Status: DC
Start: 2023-12-23 — End: 2023-12-24
  Administered 2023-12-23 – 2023-12-24 (×3): 200 mg via ORAL
  Filled 2023-12-23 (×4): qty 1

## 2023-12-23 MED ORDER — ALPRAZOLAM 0.5 MG PO TABS: 0.5000 mg | ORAL_TABLET | Freq: Two times a day (BID) | ORAL | Status: DC | PRN

## 2023-12-23 MED ORDER — SODIUM CHLORIDE 0.9 % IV SOLN: INTRAVENOUS | Status: AC

## 2023-12-23 NOTE — Telephone Encounter (Signed)
 Patient needed to R/S 10/13 appt he is in the hospital. Next availability is February so if he needs refills between now and then he is concerned and wanted to make sure the request gets processed when the pharmacy calls.

## 2023-12-23 NOTE — TOC CM/SW Note (Signed)
 Transition of Care [facility]) - Inpatient Brief Assessment   Patient Details  Name: [name] MRN: [id number] Date of Birth: [birth year]  Transition of Care [facility]) CM/SW Contact:    Tom-Johnson, Harvest Muskrat, RN Phone Number: 12/23/2023, 2:38 PM   Clinical Narrative:  Patient presented to the ED with Shortness of Breath. Admitted with increase in size of Lt Pleural Effusion, Sub-acute hyponatremia and Multifocal Pneumonia.  Patient presented to the ED a month prior this admit after a fall and struck his Lt Chest. CT on 11/22/23 showed lt Rib Fx's  and small Lt Pleural Effusion. Patient has hx of Seizures, CAD s/p PCI with Stent placement, HLD, HTN.  Patient underwent Thoracentesis by IR on 12/22/23. Currently on IV abx.   CM spoke with patient and wife, [name] at bedside about needs for post hospital transition. Lives with wife, has two supportive children. Retired, independent with care and drive self. Does not have DME's at home.  PCP is Shepard Ade, MD and uses [facility] on [address]  No ICM needs or recommendations noted at this time.  Patient not Medically ready for discharge.  CM will continue to follow as patient progresses with care towards discharge.                   Transition of Care Asessment: Insurance and Status: Insurance coverage has been reviewed Patient has primary care physician: Yes Home environment has been reviewed: Yes Prior level of function:: Independent Prior/Current Home Services: No current home services Social Drivers of Health Review: SDOH reviewed no interventions necessary Readmission risk has been reviewed: Yes Transition of care needs: no transition of care needs at this time

## 2023-12-23 NOTE — Plan of Care (Signed)
   Problem: Education: Goal: Knowledge of General Education information will improve Description: Including pain rating scale, medication(s)/side effects and non-pharmacologic comfort measures Outcome: Progressing   Problem: Nutrition: Goal: Adequate nutrition will be maintained Outcome: Progressing   Problem: Safety: Goal: Ability to remain free from injury will improve Outcome: Progressing

## 2023-12-23 NOTE — Progress Notes (Signed)
 [facility]   ROUNDING   NOTE LATRELL POTEMPA FMW:[id number]  DOB: May 24, 1945  DOA: 12/21/2023  PCP: Shepard Ade, MD  12/23/2023,8:43 AM  LOS: 2 days    Code Status: Full code     from: Home   78 year old white male CAD stenting last 2008 follows with Dr. Barry aspirin Plavix  Takes Tegretol  for seizure disorder Cervical spine disease BPH HLD complicated by statin intolerance History of sleep apnea 10/6 present ED shortness of breath [fall Wrenn/6/25 landed on left side bruised sacral area and left rib cage-CT chest abdomen pelvis at that time showed left 7th through 10th fractures-was given lidocaine  patch tramadol ] Santina to urgent care sided pleural effusion IR consulted-1.5 L bloody pleural fluid found   Labs on admit BUN/creatinine 20/1.3 LFTs normal BNP 347-hemoglobin 9.8 platelet 280 Iron 28 saturation 17 osmolality 262 WBCs 9.0 hemoglobin 9.8 platelet 280-ethanol level less than 15 Fluid analysis r--turbid and red to color, LDH 96, protein 3.6 nucleated cell count 1175 lymphocytes 13 neutrophil count 12 Gram stain.  Imaging echocardiogram EF 60-65% grade 3 diastolic dysfunction (restrictive) left atrium moderately dilated right atrium same-mild aortic regurg no stenosis no evidence of mitral valve regurg 10/8 x-ray showed no significant change in left basilar opacity?  Atelectasis infiltrate effusion   Assessment  & Plan :    Recent fall October 6 with 7 through 11 rib fractures Pain control lidocaine  patch, gabapentin 100 at bedtime, Tylenol around-the-clock 1000 Q6 Have encouraged him to maybe use something stronger if he needs more pain control Exudative pleural effusion by protein analysis rule out infection Probable left lower lobe pneumonia Empiric ceftriaxone and azithromycin as cannot completely rule out infection Await pleural fluid culture-suspect rib injury and splinting have caused him to possibly require a superimposed pneumonia Do not antibiotics for now Seizure  disorder Is on Tegretol  at home 200 3 times daily which we have resumed He needs to be careful with ambulation given falls recently CAD with chronic statin intolerance Zetia  10, blood pressure meds including beta-blocker as below, continue aspirin 81 continue chronic Plavix  75 Depression continue gabapentin 100 at bedtime, sertraline 50 a.m., BPH Continue mirabegron 25 daily Sleep apnea Unclear if uses CPAP at home Likely tea toast potomania with euvolemic hyponatremia Is on Tegretol  which can sometimes cause hyponatremia in the right setting Serum OSM are 262 which correlates with low sodium so this is probably either slight excess free water versus tea toast  Trial saline 40 cc/H-fluid restrict 1500 cc repeat labs a.m.-expect will improve HTN CKD 3 AA Telmisartan held, Bystolic  cut back from 10-5 given effusion continues hydralazine  30 3 times daily continues amlodipine  5 twice daily Will resume slowly ARB given underlying CKD-medicate him with hydralazine  Have explained to him carefully about the same   Long discussion with wife at bedside understands plan  Data Reviewed:   Sodium 126 potassium 4.1 chloride 94 BUN/creatinine 22/1.2 Iron 28 saturations 13 osmolality 262  DVT prophylaxis: SCD  Status is: Inpatient Inpatient pending resolution in the next 24 hours of sodium and clarity regarding pneumonia     Current Dispo: Likely home with no services     Subjective:   Feels with several questions about diagnosis and why he still needs to be hospitalized No fever no chills no nausea no vomiting No left-sided chest pain breathing fair not on oxygen Eating and drinking well Passing urine No abdominal discomfort     Objective + exam Vitals:   12/23/23 0403 12/23/23 0500 12/23/23 0700 12/23/23 9162  BP: (!) 163/72  (!) 193/73 (!) 193/73  Pulse:      Resp:      Temp: (!) 97.5 F (36.4 C)  97.6 F (36.4 C)   TempSrc: Oral  Oral   SpO2:      Weight:  97.2 kg     Height:       Filed Weights   12/21/23 1547 12/23/23 0500  Weight: 96.2 kg 97.2 kg     Examination: Awake coherent pleasant no distress EOMI NCAT no focal deficit no icterus no pallor Multiple SKs over back Neck soft supple Partial denture upper Good dentition otherwise S1-S2 no murmur Decreased air entry posterolaterally left side with increased fremitus resonance and dull percussion note right side clear Abdomen soft no rebound no guarding No lower extremity edema Neuro intact moving 4 limbs equally Psych euthymic coherent-quite hard of hearing      Scheduled Meds:  amLODipine   5 mg Oral BID   aspirin EC  81 mg Oral QHS   carbamazepine   100 mg Oral Daily   carbamazepine   200 mg Oral Q8H   carbamazepine   200 mg Oral TID AC & HS   clopidogrel   75 mg Oral Daily   ezetimibe   10 mg Oral Daily   gabapentin  100 mg Oral QHS   hydrALAZINE   50 mg Oral TID   irbesartan  75 mg Oral Daily   lidocaine  (PF)  10 mL Infiltration Once   mirabegron ER  25 mg Oral Daily   nebivolol   5 mg Oral QHS   pravastatin  40 mg Oral Daily   sertraline  50 mg Oral q AM   Continuous Infusions:  sodium chloride      azithromycin Stopped (12/22/23 2242)   cefTRIAXone (ROCEPHIN)  IV Stopped (12/22/23 1240)    Time 90  Colen Grimes, MD  [facility]

## 2023-12-23 NOTE — Telephone Encounter (Signed)
 Should not be issue. Will renew when requested by pharmacy.

## 2023-12-23 NOTE — Hospital Course (Addendum)
    Follow-up Sodium 126 potassium 4.194 BUN/creatinine 22/1.2 WBC 7.9 hemoglobin 10.3 platelet 279

## 2023-12-23 NOTE — Plan of Care (Signed)

## 2023-12-24 DIAGNOSIS — J90 Pleural effusion, not elsewhere classified: Secondary | ICD-10-CM | POA: Diagnosis not present

## 2023-12-24 LAB — BASIC METABOLIC PANEL WITH GFR
Anion gap: 9 (ref 5–15)
BUN: 21 mg/dL (ref 8–23)
CO2: 23 mmol/L (ref 22–32)
Calcium: 8.1 mg/dL — ABNORMAL LOW (ref 8.9–10.3)
Chloride: 97 mmol/L — ABNORMAL LOW (ref 98–111)
Creatinine, Ser: 1.08 mg/dL (ref 0.61–1.24)
GFR, Estimated: >60 mL/min (ref >60)
Glucose, Bld: 104 mg/dL — ABNORMAL HIGH (ref 70–99)
Potassium: 4.2 mmol/L (ref 3.5–5.1)
Sodium: 129 mmol/L — ABNORMAL LOW (ref 135–145)

## 2023-12-24 LAB — CBC WITH DIFFERENTIAL/PLATELET
Abs Immature Granulocytes: 0.05 K/uL (ref 0.00–0.07)
Basophils Absolute: 0 K/uL (ref 0.0–0.1)
Basophils Relative: 0 %
Eosinophils Absolute: 0.2 K/uL (ref 0.0–0.5)
Eosinophils Relative: 3 %
HCT: 29.6 % — ABNORMAL LOW (ref 39.0–52.0)
Hemoglobin: 9.9 g/dL — ABNORMAL LOW (ref 13.0–17.0)
Immature Granulocytes: 1 %
Lymphocytes Relative: 13 %
Lymphs Abs: 1 K/uL (ref 0.7–4.0)
MCH: 28.7 pg (ref 26.0–34.0)
MCHC: 33.4 g/dL (ref 30.0–36.0)
MCV: 85.8 fL (ref 80.0–100.0)
Monocytes Absolute: 1 K/uL (ref 0.1–1.0)
Monocytes Relative: 12 %
Neutro Abs: 5.7 K/uL (ref 1.7–7.7)
Neutrophils Relative %: 71 %
Platelets: 296 K/uL (ref 150–400)
RBC: 3.45 MIL/uL — ABNORMAL LOW (ref 4.22–5.81)
RDW: 13.4 % (ref 11.5–15.5)
WBC: 7.9 K/uL (ref 4.0–10.5)
nRBC: 0 % (ref 0.0–0.2)

## 2023-12-24 LAB — FLOW CYTOMETRY REQUEST - FLUID (INPATIENT)

## 2023-12-24 MED ORDER — LEVOFLOXACIN 500 MG PO TABS
500.0000 mg | ORAL_TABLET | Freq: Every day | ORAL | Status: DC
  Administered 2023-12-24: 500 mg via ORAL
  Filled 2023-12-24: qty 1

## 2023-12-24 MED ORDER — SERTRALINE HCL 100 MG PO TABS: 100.0000 mg | ORAL_TABLET | Freq: Every morning | ORAL | Status: DC

## 2023-12-24 NOTE — Care Management Important Message (Addendum)
 Important Message  Patient Details  Name: [name] MRN: [id number] Date of Birth: December 29, 1945   Important Message Given:  Yes - Medicare IM   Patient left prior to IM delivery will mail a copy to the patient home address.   Yolandra Habig 12/24/2023, 2:09 PM

## 2023-12-24 NOTE — Discharge Summary (Signed)
 Physician Discharge Summary  BURLE KWAN FMW:[id number] DOB: 1945/06/24 DOA: 12/21/2023  PCP: Shepard Ade, MD  Admit date: 12/21/2023 Discharge date: 12/24/2023  Time spent: 26 minutes  Recommendations for Outpatient Follow-up:  Requires CBC Chem-12 in about 1 week  Discharge Diagnoses:  MAIN problem for hospitalization   Exudative pleural effusion  Please see below for itemized issues addressed in HOpsital- refer to other progress notes for clarity if needed  Discharge Condition: Improved  Diet recommendation: Heart healthy  Filed Weights   12/21/23 1547 12/23/23 0500 12/24/23 0500  Weight: 96.2 kg 97.2 kg 93.2 kg    History of present illness:  78 year old white male CAD stenting last 2008 follows with Dr. Barry aspirin Plavix  Takes Tegretol  for seizure disorder Cervical spine disease BPH HLD complicated by statin intolerance History of sleep apnea 10/6 present ED shortness of breath [fall Wrenn/6/25 landed on left side bruised sacral area and left rib cage-CT chest abdomen pelvis at that time showed left 7th through 10th fractures-was given lidocaine  patch tramadol ] Went to urgent care sided pleural effusion IR consulted-1.5 L bloody pleural fluid found     Labs on admit BUN/creatinine 20/1.3 LFTs normal BNP 347-hemoglobin 9.8 platelet 280 Iron 28 saturation 17 osmolality 262 WBCs 9.0 hemoglobin 9.8 platelet 280-ethanol level less than 15 Fluid analysis r--turbid and red to color, LDH 96, protein 3.6 nucleated cell count 1175 lymphocytes 13 neutrophil count 12 Gram stain.   Imaging echocardiogram EF 60-65% grade 3 diastolic dysfunction (restrictive) left atrium moderately dilated right atrium same-mild aortic regurg no stenosis no evidence of mitral valve regurg 10/8 x-ray showed no significant change in left basilar opacity?  Atelectasis infiltrate effusion    Assessment  & Plan :      Recent fall October 6 with 7 through 11 rib fractures Continue pain  is moderately well-controlled without meds he was not using the gabapentin or Tylenol in the hospital Outpatient patient can use Tylenol Exudative pleural effusion by protein analysis rule out infection--see below regarding BNP Probable left lower lobe pneumonia Empiric ceftriaxone and azithromycin narrowed to Levaquin to complete a total of 5 days antibiotics and he has a prescription of this still from prior to admission that he has not completed and I have told him to take just 2 more days Culture preliminary on pleural fluid is negative preliminary cytology also was negative so I think that this was all sympathetic effusion secondary to the fall and rib fractures and this will need outpatient chest x-ray/potential CT scan in 1 month to denote resolution of infiltrates Seizure disorder Is on Tegretol  at home and can resume at prior to admission dosing He needs to be careful with ambulation given falls recently CAD with chronic statin intolerance BNP elevation was probably secondary to sympathetic effusion and reactive and do not think he has volume overload Zetia  10, blood pressure meds including beta-blocker as below, continue aspirin 81 continue chronic Plavix  75 Depression  continue gabapentin 100 at bedtime, sertraline 50 a.m.---> son reported some dysthymia recently and adjustment disorder and was taking this medication for only about a week-he prefers to defer escalating the dose over the next several weeks with his PCP BPH Continue mirabegron 25 daily Sleep apnea Unclear if uses CPAP at home Likely tea toast potomania with euvolemic hyponatremia Is on Tegretol  which can sometimes cause hyponatremia in the right setting Serum OSM are 262 which correlates with low sodium so this is probably either slight excess free water versus tea toast  With trial  of fluid restriction as well as IV NS overnight sodium is close to baseline range of around 129-130 I have told him to restrict fluids for  the next day or so and follow-up with labs in about 1 week at primary care office He is improved overall HTN CKD 3 AA Telmisartan held, Bystolic  cut back from 10-5 given effusion continues hydralazine  30 3 times daily continues amlodipine  5 twice daily Telmisartan reviewed and increased to home dosing at discharge  Discharge Exam: Vitals:   12/24/23 0022 12/24/23 0343  BP: (!) 164/63 (!) 172/68  Pulse: (!) 58 (!) 54  Resp: 15 12  Temp: 98.7 F (37.1 C) 98.1 F (36.7 C)  SpO2: 97% 97%    Subj on day of d/c   Awake coherent no distress sitting up in chair no shortness of breath no fever no chills  General Exam on discharge  EOMI NCAT decreased air entry to the left but improved from yesterday Chest overall is clear S1-S2 no murmur rub gallop Abdomen is soft no rebound no guarding Trace lower extremity edema Power 5/5   Discharge Instructions   Discharge Instructions     Diet - low sodium heart healthy   Complete by: As directed    Discharge instructions   Complete by: As directed    Your diagnosis during your hospital stay was that you had something called a pleural effusion which probably came from you falling hurting some of your ribs and then because you are on aspirin and Plavix  developing some bloody fluid from this It is unlikely you have a pneumonia as the test that we did did not confirm this clearly however it is reasonable to finish 2 more days of the Levaquin that you already have at home DO NOT TAKE ANY FURTHER prednisone I would encourage you to cut back on the amount of fluid that you drink short-term and get labs in about 3 to 4 days before you are seen by Dr. Shepard He will direct you as to the next timing of imaging for your lungs which would probably be in 2 to 4 weeks to make sure that the fluid clears up Be careful moving around and walking so that you do not have any further falls Best of luck   Increase activity slowly   Complete by: As directed        Allergies as of 12/24/2023       Reactions   Hydrochlorothiazide Other (See Comments)   Visual changes with higher dosage.        Medication List     TAKE these medications    acetaminophen 500 MG tablet Commonly known as: TYLENOL Take 1,000 mg by mouth every 6 (six) hours as needed for mild pain (pain score 1-3).   amLODipine  5 MG tablet Commonly known as: NORVASC  TAKE ONE TABLET TWICE DAILY   aspirin 81 MG tablet Take 81 mg by mouth at bedtime.   carbamazepine  200 MG tablet Commonly known as: TEGRETOL  Take 200 mg by mouth 4 (four) times daily -  before meals and at bedtime. Take one tablet (200mg ) by mouth at breakfast, lunch, and bedtime.  Take one-half tablet (100mg ) by mouth at dinner   clopidogrel  75 MG tablet Commonly known as: PLAVIX  TAKE ONE TABLET DAILY   ezetimibe  10 MG tablet Commonly known as: ZETIA  Take 1 tablet (10 mg total) by mouth daily.   gabapentin 100 MG capsule Commonly known as: NEURONTIN Take 100 mg by mouth at bedtime. Take  one capsule (100mg ) by mouth at bedtime.   hydrALAZINE  50 MG tablet Commonly known as: APRESOLINE  TAKE ONE TABLET BY MOUTH THREE TIMES DAILY   lidocaine  5 % Commonly known as: Lidoderm  Place 1 patch onto the skin daily. Remove & Discard patch within 12 hours or as directed by MD   nebivolol  10 MG tablet Commonly known as: BYSTOLIC  TAKE ONE TABLET EVERY DAY What changed: when to take this   Pitavastatin  Calcium  2 MG Tabs Take 2 mg by mouth daily with breakfast.   sertraline 50 MG tablet Commonly known as: ZOLOFT Take 50 mg by mouth in the morning.   telmisartan 80 MG tablet Commonly known as: MICARDIS Take 80 mg by mouth daily.   Vibegron 75 MG Tabs Take 1 tablet by mouth daily after supper.   Vitamin D3 25 MCG (1000 UT) Caps Take 1,000 Units by mouth daily with lunch.       Allergies  Allergen Reactions   Hydrochlorothiazide Other (See Comments)    Visual changes with higher dosage.       The results of significant diagnostics from this hospitalization (including imaging, microbiology, ancillary and laboratory) are listed below for reference.    Significant Diagnostic Studies: DG CHEST PORT 1 VIEW Result Date: 12/23/2023 CLINICAL DATA:  Dyspnea. EXAM: PORTABLE CHEST 1 VIEW COMPARISON:  12/22/2023 FINDINGS: Heart size remains stable. Opacity in the left lung base shows no significant change, and radio represent a combination of atelectasis, infiltrate, and pleural effusion. Right lung remains clear. IMPRESSION: No significant change in left basilar opacity, likely a combination of atelectasis, infiltrate, and pleural effusion. Electronically Signed   By: Norleen DELENA Kil M.D.   On: 12/23/2023 06:26   ECHOCARDIOGRAM COMPLETE Result Date: 12/22/2023    ECHOCARDIOGRAM REPORT   Patient Name:   [name] [facility] Date of Exam: 12/22/2023 Medical Rec #:  [id number]     Height:       73.0 in Accession #:    [id number]    Weight:       212.0 lb Date of Birth:  Jun 05, 1945     BSA:          2.206 m Patient Age:    78 years      BP:           162/53 mmHg Patient Gender: M             HR:           58 bpm. Exam Location:  Inpatient Procedure: 2D Echo (Both Spectral and Color Flow Doppler were utilized during            procedure). Indications:    CHF  History:        Patient has no prior history of Echocardiogram examinations.                 CHF.  Sonographer:    Norleen Amour Referring Phys: [id number] JUSTIN B HOWERTER IMPRESSIONS  1. Left ventricular ejection fraction, by estimation, is 60 to 65%. The left ventricle has normal function. The left ventricle has no regional wall motion abnormalities. Left ventricular diastolic parameters are consistent with Grade III diastolic dysfunction (restrictive).  2. Right ventricular systolic function is normal. The right ventricular size is mildly enlarged. There is mildly elevated pulmonary artery systolic pressure. The estimated right ventricular systolic pressure  is 39.0 mmHg.  3. Left atrial size was moderately dilated.  4. Right atrial size was moderately dilated.  5. The mitral valve is  normal in structure. No evidence of mitral valve regurgitation. No evidence of mitral stenosis.  6. The aortic valve is normal in structure. Aortic valve regurgitation is mild. No aortic stenosis is present.  7. The inferior vena cava is normal in size with greater than 50% respiratory variability, suggesting right atrial pressure of 3 mmHg. FINDINGS  Left Ventricle: Left ventricular ejection fraction, by estimation, is 60 to 65%. The left ventricle has normal function. The left ventricle has no regional wall motion abnormalities. The left ventricular internal cavity size was normal in size. There is  no left ventricular hypertrophy. Left ventricular diastolic parameters are consistent with Grade III diastolic dysfunction (restrictive). Right Ventricle: The right ventricular size is mildly enlarged. No increase in right ventricular wall thickness. Right ventricular systolic function is normal. There is mildly elevated pulmonary artery systolic pressure. The tricuspid regurgitant velocity is 3.00 m/s, and with an assumed right atrial pressure of 3 mmHg, the estimated right ventricular systolic pressure is 39.0 mmHg. Left Atrium: Left atrial size was moderately dilated. Right Atrium: Right atrial size was moderately dilated. Pericardium: There is no evidence of pericardial effusion. Mitral Valve: The mitral valve is normal in structure. No evidence of mitral valve regurgitation. No evidence of mitral valve stenosis. Tricuspid Valve: The tricuspid valve is normal in structure. Tricuspid valve regurgitation is mild . No evidence of tricuspid stenosis. Aortic Valve: The aortic valve is normal in structure. Aortic valve regurgitation is mild. No aortic stenosis is present. Pulmonic Valve: The pulmonic valve was normal in structure. Pulmonic valve regurgitation is trivial. No evidence of pulmonic  stenosis. Aorta: The aortic root is normal in size and structure. Venous: The inferior vena cava is normal in size with greater than 50% respiratory variability, suggesting right atrial pressure of 3 mmHg. IAS/Shunts: No atrial level shunt detected by color flow Doppler.  LEFT VENTRICLE PLAX 2D LVIDd:         4.80 cm      Diastology LVIDs:         2.80 cm      LV e' medial:    8.92 cm/s LV PW:         1.30 cm      LV E/e' medial:  12.6 LV IVS:        1.20 cm      LV e' lateral:   13.20 cm/s LVOT diam:     2.00 cm      LV E/e' lateral: 8.5 LV SV:         87 LV SV Index:   40 LVOT Area:     3.14 cm  LV Volumes (MOD) LV vol d, MOD A2C: 112.0 ml LV vol d, MOD A4C: 92.2 ml LV vol s, MOD A2C: 35.3 ml LV vol s, MOD A4C: 33.3 ml LV SV MOD A2C:     76.7 ml LV SV MOD A4C:     92.2 ml LV SV MOD BP:      66.5 ml RIGHT VENTRICLE             IVC RV Basal diam:  4.20 cm     IVC diam: 1.40 cm RV S prime:     14.60 cm/s TAPSE (M-mode): 2.9 cm LEFT ATRIUM              Index        RIGHT ATRIUM           Index LA diam:        3.80  cm  1.72 cm/m   RA Area:     26.60 cm LA Vol (A2C):   103.0 ml 46.70 ml/m  RA Volume:   83.20 ml  37.72 ml/m LA Vol (A4C):   97.0 ml  43.98 ml/m LA Biplane Vol: 102.0 ml 46.25 ml/m  AORTIC VALVE             PULMONIC VALVE LVOT Vmax:   120.00 cm/s PV Vmax:       1.38 m/s LVOT Vmean:  81.700 cm/s PV Peak grad:  7.6 mmHg LVOT VTI:    0.278 m  AORTA Ao Root diam: 3.10 cm Ao Asc diam:  3.40 cm MITRAL VALVE                TRICUSPID VALVE MV Area (PHT): 3.48 cm     TR Peak grad:   36.0 mmHg MV Decel Time: 218 msec     TR Vmax:        300.00 cm/s MV E velocity: 112.00 cm/s MV A velocity: 49.30 cm/s   SHUNTS MV E/A ratio:  2.27         Systemic VTI:  0.28 m                             Systemic Diam: 2.00 cm Joelle Azobou Tonleu Electronically signed by Joelle Cedars Tonleu Signature Date/Time: 12/22/2023/2:45:27 PM    Final    IR THORACENTESIS ASP PLEURAL SPACE W/IMG GUIDE Result Date:  12/22/2023 INDICATION: 78 year old male with recent history of fall, rib fractures, now presenting with right-sided pneumonia, left-sided pleural effusion. IR was requested for diagnostic and therapeutic thoracentesis. EXAM: ULTRASOUND GUIDED DIAGNOSTIC AND THERAPEUTIC THORACENTESIS MEDICATIONS: 5 cc of 1% lidocaine  with epi. COMPLICATIONS: None immediate. PROCEDURE: An ultrasound guided thoracentesis was thoroughly discussed with the patient and questions answered. The benefits, risks, alternatives and complications were also discussed. The patient understands and wishes to proceed with the procedure. Written consent was obtained. Ultrasound was performed to localize and mark an adequate pocket of fluid in the left chest. The area was then prepped and draped in the normal sterile fashion. 1% Lidocaine  was used for local anesthesia. Under ultrasound guidance a 6 Fr Safe-T-Centesis catheter was introduced. Thoracentesis was performed. The catheter was removed and a dressing applied. FINDINGS: A total of approximately 1.5 L of clear, blood laden pleural fluid was removed. Samples were sent to the laboratory as requested by the clinical team. IMPRESSION: Successful ultrasound guided left thoracentesis yielding 1.5 L of pleural fluid. Procedure performed by Carlin Griffon, PA-C Electronically Signed   By: Juliene Balder M.D.   On: 12/22/2023 10:15   DG Chest 1 View Result Date: 12/22/2023 EXAM: 1 VIEW XRAY OF THE CHEST 12/22/2023 09:31:00 AM COMPARISON: Chest CT yesterday and chest radiographs yesterday. CLINICAL HISTORY: 78 year old male with ascites. Post left side thoracentesis, 1.5 liters removed. FINDINGS: LUNGS AND PLEURA: Decreased volume of left pleural effusion, layering and with moderate residual left lung base opacification. Pneumothorax identified. No pulmonary edema. HEART AND MEDIASTINUM: Stable mediastinal contours. BONES AND SOFT TISSUES: No acute osseous abnormality. IMPRESSION: 1. Decreased left pleural  effusion with No pneumothorax identified. Electronically signed by: Helayne Hurst MD 12/22/2023 09:50 AM EDT RP Workstation: [id number]   CT Chest W Contrast Result Date: 12/21/2023 EXAM: CT CHEST WITH CONTRAST 12/21/2023 07:28:31 PM TECHNIQUE: CT of the chest was performed with the administration of intravenous contrast. Multiplanar reformatted images are provided for review.  Automated exposure control, iterative reconstruction, and/or weight based adjustment of the mA/kV was utilized to reduce the radiation dose to as low as reasonably achievable. COMPARISON: Comparison to prior examination on November 22, 2023. CLINICAL HISTORY: Pleural effusion, malignancy suspected; Rib fracture suspected, traumatic. FINDINGS: MEDIASTINUM: Extensive multivessel coronary artery calcification. Mild cardiomegaly, stable. No pericardial effusion. Central pulmonary arteries were of normal caliber. Thoracic Aorta is unremarkable. Right paratracheal, hilar, and subcarinal adenopathy has developed, likely reactive in nature. No frankly pathologic adenopathy. LYMPH NODES: Right paratracheal, hilar, and subcarinal adenopathy has developed, likely reactive in nature. No frankly pathologic adenopathy. LUNGS AND PLEURA: Left pleural effusion has increased in size and is now large with subtotal collapse of the left lower lobe since the left upper lobe. Scattered ground-glass infiltrates, peribronchial nodular infiltrates, and bronchial wall thickening is seen throughout the right lung, new since prior examination and was suggestive of acute multifocal bronchopneumonia. No pneumothorax. No pleural effusion on the right. Stable scarring within the anterior, basilar right upper lobe. SOFT TISSUES/BONES: No acute abnormality of the bones or soft tissues. UPPER ABDOMEN: No acute abnormality within the visualized upper abdomen. IMPRESSION: 1. Large left pleural effusion with subtotal collapse of the left lower lobe and left upper lobe. 2. Acute  multifocal bronchopneumonia in the right lung. 3. Right paratracheal, hilar, and subcarinal adenopathy, likely reactive in nature. No frankly pathologic adenopathy. Electronically signed by: Dorethia Molt MD 12/21/2023 07:51 PM EDT RP Workstation: [id number]   DG Chest 2 View Result Date: 12/21/2023 CLINICAL DATA:  shortness of breath EXAM: CHEST - 2 VIEW COMPARISON:  Chest x-ray 12/01/2022, CT chest 11/22/2023. FINDINGS: The heart and mediastinal contours are unchanged. Atherosclerotic plaque. No focal consolidation. No pulmonary edema. Interval increase in moderate left pleural effusion. No right pleural effusion. No pneumothorax. No acute osseous abnormality. Right acromioclavicular joint degenerative changes. IMPRESSION: Interval increase in moderate left pleural effusion. Electronically Signed   By: Morgane  Naveau M.D.   On: 12/21/2023 17:05    Microbiology: Recent Results (from the past 240 hours)  Body fluid culture w Gram Stain     Status: None (Preliminary result)   Collection Time: 12/22/23  9:33 AM   Specimen: Lung, Left; Pleural Fluid  Result Value Ref Range Status   Specimen Description PLEURAL  Final   Special Requests LUNG,LEFT  Final   Gram Stain   Final    WBC PRESENT, PREDOMINANTLY MONONUCLEAR NO ORGANISMS SEEN    Culture   Final    NO GROWTH 2 DAYS Performed at [facility], [address], [place], [place] [address]    Report Status PENDING  Incomplete     Labs: Basic Metabolic Panel: Recent Labs  Lab 12/21/23 1552 12/21/23 1910 12/22/23 0245 12/23/23 0218 12/24/23 0221  NA 123* 122* 124* 126* 129*  K 4.5 4.6 4.4 4.1 4.2  CL 91* 92* 91* 94* 97*  CO2 22  --  21* 21* 23  GLUCOSE 109* 113* 104* 105* 104*  BUN 20 20 19 22 21   CREATININE 1.39* 1.30* 1.38* 1.25* 1.08  CALCIUM  8.5*  --  7.9* 8.1* 8.1*  MG  --   --  1.9  1.9  --   --   PHOS  --   --  3.7  --   --    Liver Function Tests: Recent Labs  Lab 12/21/23 1902 12/22/23 0245  AST 25 19   ALT 18 16  ALKPHOS 87 75  BILITOT 0.5 0.4  PROT 6.7 5.7*  ALBUMIN  3.4* 2.9*   No results for input(s): LIPASE, AMYLASE in the last 168 hours. No results for input(s): AMMONIA in the last 168 hours. CBC: Recent Labs  Lab 12/21/23 1552 12/21/23 1910 12/22/23 0245 12/23/23 0218 12/24/23 0221  WBC 10.5  --  9.0 7.9 7.9  NEUTROABS  --   --  6.4 5.6 5.7  HGB 10.5* 11.6* 9.8* 10.3* 9.9*  HCT 31.3* 34.0* 29.4* 30.2* 29.6*  MCV 85.8  --  87.0 85.3 85.8  PLT 291  --  280 279 296   Cardiac Enzymes: No results for input(s): CKTOTAL, CKMB, CKMBINDEX, TROPONINI in the last 168 hours. BNP: BNP (last 3 results) Recent Labs    12/22/23 0245  BNP 347.4*    ProBNP (last 3 results) No results for input(s): PROBNP in the last 8760 hours.  CBG: No results for input(s): GLUCAP in the last 168 hours.  Signed:  Colen Grimes MD   [facility] 12/24/2023, 10:47 AM

## 2023-12-24 NOTE — Progress Notes (Signed)
   12/24/23 0930  Mobility  Activity Ambulated independently  Level of Assistance Standby assist, set-up cues, supervision of patient - no hands on  Assistive Device None  Distance Ambulated (ft) 200 ft  Activity Response Tolerated fair  Mobility Referral Yes  Mobility visit 1 Mobility  Mobility Specialist Start Time (ACUTE ONLY) 0930  Mobility Specialist Stop Time (ACUTE ONLY) 0943  Mobility Specialist Time Calculation (min) (ACUTE ONLY) 13 min   Mobility Specialist: Progress Note  During Mobility: HR 126  Pt agreeable to mobility session - received in standstill chair. C/o numbness in heels d/t neuropathy. Returned to stand still chair with all needs met - call bell within reach.    Additional comments: Wife present.   Virgle Boards, BS Mobility Specialist Please contact via SecureChat or  Rehab office at [phone].

## 2023-12-24 NOTE — Plan of Care (Signed)

## 2023-12-25 LAB — BODY FLUID CULTURE W GRAM STAIN: Culture: NO GROWTH

## 2023-12-25 LAB — CHOLESTEROL, BODY FLUID: Cholesterol, Fluid: 83 mg/dL

## 2023-12-28 ENCOUNTER — Ambulatory Visit: Payer: PPO | Admitting: Adult Health

## 2023-12-29 DIAGNOSIS — I10 Essential (primary) hypertension: Secondary | ICD-10-CM | POA: Diagnosis not present

## 2023-12-29 DIAGNOSIS — E785 Hyperlipidemia, unspecified: Secondary | ICD-10-CM | POA: Diagnosis not present

## 2023-12-29 DIAGNOSIS — Z125 Encounter for screening for malignant neoplasm of prostate: Secondary | ICD-10-CM | POA: Diagnosis not present

## 2023-12-29 DIAGNOSIS — Z1212 Encounter for screening for malignant neoplasm of rectum: Secondary | ICD-10-CM | POA: Diagnosis not present

## 2023-12-29 DIAGNOSIS — Z0189 Encounter for other specified special examinations: Secondary | ICD-10-CM | POA: Diagnosis not present

## 2023-12-30 ENCOUNTER — Other Ambulatory Visit (HOSPITAL_COMMUNITY): Payer: Self-pay | Admitting: Internal Medicine

## 2023-12-30 DIAGNOSIS — J189 Pneumonia, unspecified organism: Secondary | ICD-10-CM | POA: Diagnosis not present

## 2023-12-30 DIAGNOSIS — Z1339 Encounter for screening examination for other mental health and behavioral disorders: Secondary | ICD-10-CM | POA: Diagnosis not present

## 2023-12-30 DIAGNOSIS — Z0000 Encounter for general adult medical examination without abnormal findings: Secondary | ICD-10-CM | POA: Diagnosis not present

## 2023-12-30 DIAGNOSIS — I25119 Atherosclerotic heart disease of native coronary artery with unspecified angina pectoris: Secondary | ICD-10-CM | POA: Diagnosis not present

## 2023-12-30 DIAGNOSIS — I10 Essential (primary) hypertension: Secondary | ICD-10-CM | POA: Diagnosis not present

## 2023-12-30 DIAGNOSIS — F32A Depression, unspecified: Secondary | ICD-10-CM | POA: Diagnosis not present

## 2023-12-30 DIAGNOSIS — E785 Hyperlipidemia, unspecified: Secondary | ICD-10-CM | POA: Diagnosis not present

## 2023-12-30 DIAGNOSIS — R82998 Other abnormal findings in urine: Secondary | ICD-10-CM | POA: Diagnosis not present

## 2023-12-30 DIAGNOSIS — F411 Generalized anxiety disorder: Secondary | ICD-10-CM | POA: Diagnosis not present

## 2023-12-30 DIAGNOSIS — Z1331 Encounter for screening for depression: Secondary | ICD-10-CM | POA: Diagnosis not present

## 2023-12-30 DIAGNOSIS — G40909 Epilepsy, unspecified, not intractable, without status epilepticus: Secondary | ICD-10-CM | POA: Diagnosis not present

## 2024-01-01 ENCOUNTER — Ambulatory Visit: Admitting: Adult Health

## 2024-01-01 ENCOUNTER — Encounter: Payer: Self-pay | Admitting: Adult Health

## 2024-01-01 VITALS — BP 147/63 | HR 53 | Ht 73.0 in | Wt 206.6 lb

## 2024-01-01 DIAGNOSIS — Z5181 Encounter for therapeutic drug level monitoring: Secondary | ICD-10-CM | POA: Diagnosis not present

## 2024-01-01 DIAGNOSIS — G40909 Epilepsy, unspecified, not intractable, without status epilepticus: Secondary | ICD-10-CM

## 2024-01-01 NOTE — Patient Instructions (Addendum)
 Your Plan:  Continue Carbamazepine   Blood work -to check sodium level If your symptoms worsen or you develop new symptoms please let us  know.    Thank you for coming to see us  at [facility]. I hope we have been able to provide you high quality care today.  You may receive a patient satisfaction survey over the next few weeks. We would appreciate your feedback and comments so that we may continue to improve ourselves and the health of our patients.

## 2024-01-01 NOTE — Progress Notes (Signed)
 PATIENT: [name] DOB: 12-12-1945  REASON FOR VISIT: follow up HISTORY FROM: patient  Chief Complaint  Patient presents with   Follow-up    Pt in 4 alone Pt here for seizures Pt states no seizures since last office visit Pt was admitted to hospital for fluid in lungs      HISTORY OF PRESENT ILLNESS: Today 01/01/24:  [name] is a 78 y.o. male with a history of seizures. Returns today for follow-up.  He reports that he has not had any seizure events.  He was recently in the hospital with pleural effusion and sodium level was low.  Sodium level improved at discharge but not back in normal range.  He did see his primary care earlier this week but reports that sodium level was not checked.  We did call their office to verify that information.  Patient states that he has been having more anxiety.  Has an appointment with a therapist later this month.  Returns today for an evaluation.    12/25/22: [name] is a 78 y.o. male with a history of Seizures. Returns today for follow-up.  He denies any seizure events.  Remains on carbamazepine  200 mg in the morning, at lunch and at bedtime.  He takes an additional half a tablet at dinner.  Overall he is doing well.  States that he has had shingles for the last 4 weeks.  He has seen his primary care.  Reports that he is still in a lot of discomfort.  Having trouble sleeping at night.   12/04/21: Mr. [name] is a 78 year old male with a history of seizures.  He returns today for follow-up.  He continues on carbamazepine  200 mg 1 tablet at breakfast lunch and at bedtime.  He takes a half a tablet at dinner.  Denies any seizure events.  Reports that he tolerates the medication well.  Able to complete all ADLs independently. Had Blood work through PCP- carbamazepine  is 11.9. reports Na level in normal range but low normal.  Returns today for an evaluation.  12/04/20: Mr. [name] is a 78 year old male with a history of seizures.  He returns today  for follow-up.  Overall he has been doing well.  Denies any seizure events.  He takes carbamazepine  200 mg 3-1/2 tablets daily.  Continues to operate a motor vehicle.  Lives at home with his spouse.  Able to complete all ADLs independently.  He returns today for an evaluation.  12/05/19 Mr. [name] is a 78 year old male with a history of seizures.  He remains on carbamazepine .  He takes 3-1/2 tablets daily.  He denies any seizure events.  He operates a Librarian, academic.  He is able to complete all ADLs independently.  He returns today for an evaluation.  HISTORY Mr. [name] is a 78 year old right-handed white male with a history of seizures that have been well controlled on carbamazepine .  He takes 3.5 of the 200 mg tablets of carbamazepine  daily, he tolerates this well.  He is on generic medication.  He has a lot of arthritic issues, he wishes to take CBD oil.  The patient otherwise reports no other significant medical issues that have come up since last seen  REVIEW OF SYSTEMS: Out of a complete 14 system review of symptoms, the patient complains only of the following symptoms, and all other reviewed systems are negative.  See HPI  ALLERGIES: Allergies  Allergen Reactions   Hydrochlorothiazide Other (See Comments)    Visual changes  with higher dosage.    HOME MEDICATIONS: Outpatient Medications Prior to Visit  Medication Sig Dispense Refill   acetaminophen (TYLENOL) 500 MG tablet Take 1,000 mg by mouth every 6 (six) hours as needed for mild pain (pain score 1-3).     amLODipine  (NORVASC ) 5 MG tablet TAKE ONE TABLET TWICE DAILY 180 tablet 3   aspirin 81 MG tablet Take 81 mg by mouth at bedtime.     carbamazepine  (TEGRETOL ) 200 MG tablet Take 200 mg by mouth 4 (four) times daily -  before meals and at bedtime. Take one tablet (200mg ) by mouth at breakfast, lunch, and bedtime.  Take one-half tablet (100mg ) by mouth at dinner     Cholecalciferol (VITAMIN D3) 25 MCG (1000 UT) CAPS Take 1,000 Units by  mouth daily with lunch.     clopidogrel  (PLAVIX ) 75 MG tablet TAKE ONE TABLET DAILY 90 tablet 3   ezetimibe  (ZETIA ) 10 MG tablet Take 1 tablet (10 mg total) by mouth daily. 90 tablet 1   gabapentin (NEURONTIN) 100 MG capsule Take 100 mg by mouth at bedtime. Take one capsule (100mg ) by mouth at bedtime.     hydrALAZINE  (APRESOLINE ) 50 MG tablet TAKE ONE TABLET BY MOUTH THREE TIMES DAILY 270 tablet 3   lidocaine  (LIDODERM ) 5 % Place 1 patch onto the skin daily. Remove & Discard patch within 12 hours or as directed by MD 30 patch 0   mirtazapine (REMERON) 15 MG tablet 1 tablet at bedtime Orally nightly; Duration: 90 days     nebivolol  (BYSTOLIC ) 10 MG tablet TAKE ONE TABLET EVERY DAY (Patient taking differently: Take 10 mg by mouth at bedtime.) 90 tablet 3   Pitavastatin  Calcium  2 MG TABS Take 2 mg by mouth daily with breakfast.     sertraline (ZOLOFT) 50 MG tablet Take 50 mg by mouth in the morning.     telmisartan (MICARDIS) 80 MG tablet Take 80 mg by mouth daily.     Vibegron 75 MG TABS Take 1 tablet by mouth daily after supper.     No facility-administered medications prior to visit.    PAST MEDICAL HISTORY: Past Medical History:  Diagnosis Date   Cervical spine disease    History of cervical spine disease   Coronary artery disease    s/p extensive stenting of the mid to distal LAD with 3 Cypher stents and past stenting of the mid RCA. He does have a diagonal branch that was jailed. He has stable angina. Negative myoview  in October of 2012; Managed medically.    Hypercholesterolemia    Hypertension    Knee pain    Seizure disorder (HCC)    Sleep apnea    use to wear cpap   Vagal reaction     PAST SURGICAL HISTORY: Past Surgical History:  Procedure Laterality Date   COLONOSCOPY     coronary artery stent placement     HERNIA REPAIR     right groin   IR THORACENTESIS ASP PLEURAL SPACE W/IMG GUIDE  12/22/2023   SHOULDER ARTHROSCOPY     right   VASECTOMY      FAMILY  HISTORY: Family History  Problem Relation Age of Onset   Hyperlipidemia Mother    Heart failure Father 49   Colon cancer Neg Hx    Seizures Neg Hx     SOCIAL HISTORY: Social History   Socioeconomic History   Marital status: Married    Spouse name: Not on file   Number of children: 2  Years of education: 13   Highest education level: Not on file  Occupational History   Occupation: Transport planner    Comment: retired  Tobacco Use   Smoking status: Former    Current packs/day: 0.00    Types: Cigarettes    Quit date: 07/03/1965    Years since quitting: 58.5   Smokeless tobacco: Never  Vaping Use   Vaping status: Never Used  Substance and Sexual Activity   Alcohol  use: Yes    Comment: occ   Drug use: No   Sexual activity: Not on file  Other Topics Concern   Not on file  Social History Narrative   Lives at home w/ his wife   Patient drinks about 2-3 cups of caffeine daily.   Patient is right handed.   Social Drivers of Corporate investment banker Strain: Not on file  Food Insecurity: No Food Insecurity (12/22/2023)   Hunger Vital Sign    Worried About Running Out of Food in the Last Year: Never true    Ran Out of Food in the Last Year: Never true  Transportation Needs: No Transportation Needs (12/22/2023)   PRAPARE - Administrator, Civil Service (Medical): No    Lack of Transportation (Non-Medical): No  Physical Activity: Not on file  Stress: Not on file  Social Connections: Moderately Integrated (12/22/2023)   Social Connection and Isolation Panel    Frequency of Communication with Friends and Family: More than three times a week    Frequency of Social Gatherings with Friends and Family: More than three times a week    Attends Religious Services: More than 4 times per year    Active Member of [employer] or Organizations: No    Attends Banker Meetings: Never    Marital Status: Married  Catering manager Violence: Not At Risk (12/23/2023)    Humiliation, Afraid, Rape, and Kick questionnaire    Fear of Current or Ex-Partner: No    Emotionally Abused: No    Physically Abused: No    Sexually Abused: No      PHYSICAL EXAM  Vitals:   01/01/24 0944  BP: (!) 147/63  Pulse: (!) 53  Weight: 206 lb 9.6 oz (93.7 kg)  Height: 6' 1 (1.854 m)     Body mass index is 27.26 kg/m.  Generalized: Well developed, in no acute distress   Neurological examination  Mentation: Alert oriented to time, place, history taking. Follows all commands speech and language fluent Cranial nerve II-XII: Pupils were equal round reactive to light. Extraocular movements were full, visual field were full on confrontational test. Head turning and shoulder shrug  were normal and symmetric. Motor: The motor testing reveals 5 over 5 strength of all 4 extremities. Good symmetric motor tone is noted throughout.  Sensory: Sensory testing is intact to soft touch on all 4 extremities. No evidence of extinction is noted.  Coordination: Cerebellar testing reveals good finger-nose-finger and heel-to-shin bilaterally.  Gait and station: Gait is normal.    DIAGNOSTIC DATA (LABS, IMAGING, TESTING) - I reviewed patient records, labs, notes, testing and imaging myself where available.  Lab Results  Component Value Date   WBC 7.9 12/24/2023   HGB 9.9 (L) 12/24/2023   HCT 29.6 (L) 12/24/2023   MCV 85.8 12/24/2023   PLT 296 12/24/2023      Component Value Date/Time   NA 129 (L) 12/24/2023 0221   NA 139 12/05/2019 1129   K 4.2 12/24/2023 0221   CL  97 (L) 12/24/2023 0221   CO2 23 12/24/2023 0221   GLUCOSE 104 (H) 12/24/2023 0221   BUN 21 12/24/2023 0221   BUN 23 12/05/2019 1129   CREATININE 1.08 12/24/2023 0221   CALCIUM  8.1 (L) 12/24/2023 0221   PROT 5.7 (L) 12/22/2023 0245   PROT 6.9 12/05/2019 1129   ALBUMIN 2.9 (L) 12/22/2023 0245   ALBUMIN 4.6 12/05/2019 1129   AST 19 12/22/2023 0245   ALT 16 12/22/2023 0245   ALKPHOS 75 12/22/2023 0245   BILITOT  0.4 12/22/2023 0245   BILITOT 0.3 12/05/2019 1129   GFRNONAA >60 12/24/2023 0221   GFRAA 62 12/05/2019 1129   Lab Results  Component Value Date   CHOL 167 04/27/2018   HDL 71 04/27/2018   LDLCALC 84 04/27/2018   TRIG 61 04/27/2018   CHOLHDL 2.4 04/27/2018       ASSESSMENT AND PLAN 78 y.o. year old male  has a past medical history of Cervical spine disease, Coronary artery disease, Hypercholesterolemia, Hypertension, Knee pain, Seizure disorder (HCC), Sleep apnea, and Vagal reaction. here with :  Seizures  Continue carbamazepine  200 mg  1 tablet in the morning, noon and bedtime and additional half a tablet with dinner Will check BMP today to evaluate sodium level Advised that if he has any seizure-like episodes he should let us  know Follow-up in 1 year or sooner if needed    Duwaine Russell, MSN, NP-C 01/01/2024, 11:38 AM [facility] [address] [place], [place] [address] [phone]

## 2024-01-02 ENCOUNTER — Ambulatory Visit: Payer: Self-pay | Admitting: Adult Health

## 2024-01-02 DIAGNOSIS — Z23 Encounter for immunization: Secondary | ICD-10-CM | POA: Diagnosis not present

## 2024-01-02 LAB — BASIC METABOLIC PANEL WITH GFR
BUN/Creatinine Ratio: 21 (ref 10–24)
BUN: 24 mg/dL (ref 8–27)
CO2: 22 mmol/L (ref 20–29)
Calcium: 9.2 mg/dL (ref 8.6–10.2)
Chloride: 95 mmol/L — ABNORMAL LOW (ref 96–106)
Creatinine, Ser: 1.17 mg/dL (ref 0.76–1.27)
Glucose: 98 mg/dL (ref 70–99)
Potassium: 5.3 mmol/L — ABNORMAL HIGH (ref 3.5–5.2)
Sodium: 130 mmol/L — ABNORMAL LOW (ref 134–144)
eGFR: 63 mL/min/1.73 (ref >59)

## 2024-01-02 NOTE — Telephone Encounter (Signed)
 I called the patient. Sodium slightly improved. Will see Dr. Shepard at the beginning of Nov. Advised to let me know if he checks blood work. If not we can recheck in 1 month.

## 2024-01-06 DIAGNOSIS — F411 Generalized anxiety disorder: Secondary | ICD-10-CM | POA: Diagnosis not present

## 2024-01-07 ENCOUNTER — Telehealth: Payer: Self-pay | Admitting: *Deleted

## 2024-01-07 ENCOUNTER — Telehealth: Payer: Self-pay | Admitting: Cardiology

## 2024-01-07 ENCOUNTER — Other Ambulatory Visit: Payer: Self-pay

## 2024-01-07 MED ORDER — EZETIMIBE 10 MG PO TABS: 10.0000 mg | ORAL_TABLET | Freq: Every day | ORAL | 1 refills | Status: AC

## 2024-01-07 NOTE — Telephone Encounter (Signed)
 Pt's medication was sent to pt's pharmacy as requested. Confirmation received.

## 2024-01-07 NOTE — Telephone Encounter (Signed)
 Received  results of lab done 12-14-2023 [facility] Dr. Harlen all normal except Sodium 123.

## 2024-01-07 NOTE — Telephone Encounter (Signed)
*  STAT* If patient is at the pharmacy, call can be transferred to refill team.   1. Which medications need to be refilled? (please list name of each medication and dose if known) ezetimibe  (ZETIA ) 10 MG tablet   2. Which pharmacy/location (including street and city if local pharmacy) is medication to be sent to?  [facility] - [place], [place] - [address]    3. Do they need a 30 day or 90 day supply? 90

## 2024-01-18 ENCOUNTER — Ambulatory Visit (HOSPITAL_BASED_OUTPATIENT_CLINIC_OR_DEPARTMENT_OTHER)
Admission: RE | Admit: 2024-01-18 | Discharge: 2024-01-18 | Disposition: A | Discharge status: Home / Self Care | Source: Ambulatory Visit | Attending: Internal Medicine | Admitting: Internal Medicine

## 2024-01-18 DIAGNOSIS — J9811 Atelectasis: Secondary | ICD-10-CM | POA: Diagnosis not present

## 2024-01-18 DIAGNOSIS — J189 Pneumonia, unspecified organism: Secondary | ICD-10-CM | POA: Insufficient documentation

## 2024-01-18 DIAGNOSIS — R918 Other nonspecific abnormal finding of lung field: Secondary | ICD-10-CM | POA: Diagnosis not present

## 2024-01-18 DIAGNOSIS — I700 Atherosclerosis of aorta: Secondary | ICD-10-CM | POA: Diagnosis not present

## 2024-01-18 MED ORDER — IOHEXOL 300 MG/ML  SOLN
100.0000 mL | Freq: Once | INTRAMUSCULAR | Status: AC | PRN
  Administered 2024-01-18: 75 mL via INTRAVENOUS

## 2024-01-20 DIAGNOSIS — J189 Pneumonia, unspecified organism: Secondary | ICD-10-CM | POA: Diagnosis not present

## 2024-01-20 DIAGNOSIS — S2249XD Multiple fractures of ribs, unspecified side, subsequent encounter for fracture with routine healing: Secondary | ICD-10-CM | POA: Diagnosis not present

## 2024-01-20 DIAGNOSIS — E785 Hyperlipidemia, unspecified: Secondary | ICD-10-CM | POA: Diagnosis not present

## 2024-01-20 DIAGNOSIS — G40909 Epilepsy, unspecified, not intractable, without status epilepticus: Secondary | ICD-10-CM | POA: Diagnosis not present

## 2024-01-20 DIAGNOSIS — I25119 Atherosclerotic heart disease of native coronary artery with unspecified angina pectoris: Secondary | ICD-10-CM | POA: Diagnosis not present

## 2024-01-20 DIAGNOSIS — I10 Essential (primary) hypertension: Secondary | ICD-10-CM | POA: Diagnosis not present

## 2024-01-20 DIAGNOSIS — F332 Major depressive disorder, recurrent severe without psychotic features: Secondary | ICD-10-CM | POA: Diagnosis not present

## 2024-01-27 ENCOUNTER — Other Ambulatory Visit: Payer: Self-pay | Admitting: Adult Health

## 2024-01-27 ENCOUNTER — Other Ambulatory Visit: Payer: Self-pay | Admitting: Cardiology

## 2024-01-27 DIAGNOSIS — G40909 Epilepsy, unspecified, not intractable, without status epilepticus: Secondary | ICD-10-CM

## 2024-01-27 DIAGNOSIS — Z5181 Encounter for therapeutic drug level monitoring: Secondary | ICD-10-CM

## 2024-01-27 DIAGNOSIS — E7800 Pure hypercholesterolemia, unspecified: Secondary | ICD-10-CM

## 2024-01-29 NOTE — Telephone Encounter (Signed)
Last refill by historical provider  Please advise

## 2024-02-02 DIAGNOSIS — J942 Hemothorax: Secondary | ICD-10-CM | POA: Diagnosis not present

## 2024-02-03 ENCOUNTER — Other Ambulatory Visit (HOSPITAL_COMMUNITY): Payer: Self-pay | Admitting: Pulmonary Disease

## 2024-02-03 DIAGNOSIS — J942 Hemothorax: Secondary | ICD-10-CM

## 2024-02-08 ENCOUNTER — Telehealth: Payer: Self-pay | Admitting: Adult Health

## 2024-02-08 ENCOUNTER — Ambulatory Visit (HOSPITAL_COMMUNITY)
Admission: RE | Admit: 2024-02-08 | Discharge: 2024-02-08 | Disposition: A | Discharge status: Home / Self Care | Source: Ambulatory Visit | Attending: Radiology | Admitting: Radiology

## 2024-02-08 ENCOUNTER — Ambulatory Visit (HOSPITAL_COMMUNITY)
Admission: RE | Admit: 2024-02-08 | Discharge: 2024-02-08 | Disposition: A | Discharge status: Home / Self Care | Source: Ambulatory Visit | Attending: Pulmonary Disease | Admitting: Pulmonary Disease

## 2024-02-08 VITALS — BP 141/59

## 2024-02-08 DIAGNOSIS — J90 Pleural effusion, not elsewhere classified: Secondary | ICD-10-CM | POA: Insufficient documentation

## 2024-02-08 DIAGNOSIS — I517 Cardiomegaly: Secondary | ICD-10-CM | POA: Diagnosis not present

## 2024-02-08 DIAGNOSIS — Z8701 Personal history of pneumonia (recurrent): Secondary | ICD-10-CM | POA: Insufficient documentation

## 2024-02-08 DIAGNOSIS — J942 Hemothorax: Secondary | ICD-10-CM

## 2024-02-08 MED ORDER — LIDOCAINE-EPINEPHRINE (PF) 2 %-1:200000 IJ SOLN
INTRAMUSCULAR | Status: AC
  Filled 2024-02-08: qty 20

## 2024-02-08 NOTE — Procedures (Signed)
 Ultrasound-guided  therapeutic left thoracentesis performed yielding 385 cc of slightly hazy, amber fluid. No immediate complications. Follow-up chest x-ray pending. EBL< 2 cc.

## 2024-02-08 NOTE — Telephone Encounter (Signed)
 Mychart msg sent about sodium level

## 2024-02-10 ENCOUNTER — Ambulatory Visit (HOSPITAL_COMMUNITY)

## 2024-02-10 ENCOUNTER — Other Ambulatory Visit: Payer: Self-pay | Admitting: Cardiology

## 2024-02-15 NOTE — Progress Notes (Unsigned)
 [name] Date of Birth: 1945-05-20 Medical Record #[id number]  History of Present Illness: [name] is seen back today for follow up CAD.  He is status post extensive stenting of the LAD with Cypher stents in the past. He had stenting of the right coronary as well. He has a diagonal branch that was jailed by the stents. Followup stress testing has been normal last in 2012 and stress Myoview  in Dec. 2015 was normal. LE arterial dopplers were normal in October 2017.  He has been followed in our lipid clinic. He has a history of intolerance to numerous statins. Was tried on Crestor  5 mg daily and did well on this for 5-6 months but then developed myalgias. LDL decreased to 76. He was  switched to Livalo  2 mg daily and he has tolerated  it well. Later increased to 4 mg daily and myalgias returned. Dose was reduced back to 2 mg daily and Zetia  started.  He was admitted in October with fall and multiple rib fractures and traumatic pleural effusion that was drained. Echo was stable.      Current Outpatient Medications on File Prior to Visit  Medication Sig Dispense Refill   acetaminophen  (TYLENOL ) 500 MG tablet Take 1,000 mg by mouth every 6 (six) hours as needed for mild pain (pain score 1-3).     amLODipine  (NORVASC ) 5 MG tablet TAKE ONE TABLET TWICE DAILY 180 tablet 3   aspirin  81 MG tablet Take 81 mg by mouth at bedtime.     carbamazepine  (TEGRETOL ) 200 MG tablet TAKE ONE TABLET AT BREAKFAST, LUNCH AND AT BEDTIME AND TAKE 1/2 TABLET WITH DINNER 315 tablet 4   Cholecalciferol (VITAMIN D3) 25 MCG (1000 UT) CAPS Take 1,000 Units by mouth daily with lunch.     clopidogrel  (PLAVIX ) 75 MG tablet TAKE ONE TABLET DAILY 90 tablet 3   ezetimibe  (ZETIA ) 10 MG tablet Take 1 tablet (10 mg total) by mouth daily. 90 tablet 1   gabapentin  (NEURONTIN ) 100 MG capsule Take 100 mg by mouth at bedtime. Take one capsule (100mg ) by mouth at bedtime.     hydrALAZINE  (APRESOLINE ) 50 MG tablet TAKE ONE TABLET BY MOUTH  THREE TIMES DAILY 270 tablet 3   lidocaine  (LIDODERM ) 5 % Place 1 patch onto the skin daily. Remove & Discard patch within 12 hours or as directed by MD 30 patch 0   mirtazapine (REMERON) 15 MG tablet 1 tablet at bedtime Orally nightly; Duration: 90 days     nebivolol  (BYSTOLIC ) 10 MG tablet TAKE ONE TABLET EVERY DAY (Patient taking differently: Take 10 mg by mouth at bedtime.) 90 tablet 3   Pitavastatin  Calcium  2 MG TABS TAKE ONE TABLET EACH DAY 90 tablet 2   sertraline  (ZOLOFT ) 50 MG tablet Take 50 mg by mouth in the morning.     telmisartan (MICARDIS) 80 MG tablet Take 80 mg by mouth daily.     Vibegron 75 MG TABS Take 1 tablet by mouth daily after supper.     No current facility-administered medications on file prior to visit.    Allergies  Allergen Reactions   Hydrochlorothiazide Other (See Comments)    Visual changes with higher dosage.    Past Medical History:  Diagnosis Date   Cervical spine disease    History of cervical spine disease   Coronary artery disease    s/p extensive stenting of the mid to distal LAD with 3 Cypher stents and past stenting of the mid RCA. He does have a  diagonal branch that was jailed. He has stable angina. Negative myoview  in October of 2012; Managed medically.    Hypercholesterolemia    Hypertension    Knee pain    Seizure disorder (HCC)    Sleep apnea    use to wear cpap   Vagal reaction     Past Surgical History:  Procedure Laterality Date   COLONOSCOPY     coronary artery stent placement     HERNIA REPAIR     right groin   IR THORACENTESIS ASP PLEURAL SPACE W/IMG GUIDE  12/22/2023   SHOULDER ARTHROSCOPY     right   VASECTOMY      Social History   Tobacco Use  Smoking Status Former   Current packs/day: 0.00   Types: Cigarettes   Quit date: 07/03/1965   Years since quitting: 58.6  Smokeless Tobacco Never    Social History   Substance and Sexual Activity  Alcohol  Use Yes   Comment: occ    Family History  Problem  Relation Age of Onset   Hyperlipidemia Mother    Heart failure Father 10   Colon cancer Neg Hx    Seizures Neg Hx     Review of Systems: The review of systems is per the HPI.  . All other systems were reviewed and are negative.  Physical Exam: There were no vitals taken for this visit. GENERAL:  Well appearing WM in NAD HEENT:  PERRL, EOMI, sclera are clear. Oropharynx is clear. NECK:  No jugular venous distention, carotid upstroke brisk and symmetric, no bruits, no thyromegaly or adenopathy LUNGS:  Clear to auscultation bilaterally CHEST:  Unremarkable HEART:  RRR,  PMI not displaced or sustained,S1 and S2 within normal limits, no S3, no S4: no clicks, no rubs, soft 1/6 SEM LSB ABD:  Soft, nontender. BS +, no masses or bruits. No hepatomegaly, no splenomegaly EXT:  2 + pulses throughout, no edema, no cyanosis no clubbing SKIN:  Warm and dry.  No rashes NEURO:  Alert and oriented x 3. Cranial nerves II through XII intact. PSYCH:  Cognitively intact  LABORATORY DATA: Lab Results  Component Value Date   WBC 7.9 12/24/2023   HGB 9.9 (L) 12/24/2023   HCT 29.6 (L) 12/24/2023   PLT 296 12/24/2023   GLUCOSE 98 01/01/2024   CHOL 167 04/27/2018   TRIG 61 04/27/2018   HDL 71 04/27/2018   LDLCALC 84 04/27/2018   ALT 16 12/22/2023   AST 19 12/22/2023   NA 130 (L) 01/01/2024   K 5.3 (H) 01/01/2024   CL 95 (L) 01/01/2024   CREATININE 1.17 01/01/2024   BUN 24 01/01/2024   CO2 22 01/01/2024   TSH 1.053 12/22/2023   INR 1.4 (H) 12/22/2023    Dated 06/08/17: cholesterol 181, triglycerides 102, HDL 62, LDL 99. Creatinine 1.3. Otherwise chemistries, CBC, TSH normal Dated 11/08/18: cholesterol 149, triglycerides 59, HDL 53, LDL 84. Creatinine 1.3. otherwise chemistries and TSH normal Dated 11/14/19: cholesterol 159, triglycerides 72, HDL 62, LDL 83.  Dated 11/22/20: cholesterol 174, triglycerides 91, HDL 71, LDL 85. GFR 58. CMET otherwise normal. Dated 11/25/21: cholesterol 172,  triglycerides 90, HDL 66, LDL 88. CMET and TSH normal. Dated 12/15/22: cholesterol, 190, triglycerides 79, HDL 70, LDL 104. CMET and TSH normal.   Echo 12/22/23: IMPRESSIONS     1. Left ventricular ejection fraction, by estimation, is 60 to 65%. The  left ventricle has normal function. The left ventricle has no regional  wall motion abnormalities. Left ventricular diastolic  parameters are  consistent with Grade III diastolic  dysfunction (restrictive).   2. Right ventricular systolic function is normal. The right ventricular  size is mildly enlarged. There is mildly elevated pulmonary artery  systolic pressure. The estimated right ventricular systolic pressure is  39.0 mmHg.   3. Left atrial size was moderately dilated.   4. Right atrial size was moderately dilated.   5. The mitral valve is normal in structure. No evidence of mitral valve  regurgitation. No evidence of mitral stenosis.   6. The aortic valve is normal in structure. Aortic valve regurgitation is  mild. No aortic stenosis is present.   7. The inferior vena cava is normal in size with greater than 50%  respiratory variability, suggesting right atrial pressure of 3 mmHg.    Assessment / Plan: 1. Coronary disease with remote  extensive stenting of the LAD and right coronaries with first generation Cypher stents. Anginal symptoms are stable class 1 on medical therapy.  Myoview  in Dec. 2015  showed no evidence of ischemia. He was unable to get HR up with exercise. He reports he did not tolerate Lexiscan  well.  Continue DAPT with ASA and Plavix  long term.    2. Hypertension. Blood pressure is excellent.  On maximal doses of olmesartan , amlodipine , Bystolic . Intolerant of HCTZ.    3. Hypercholesterolemia.  On Livalo  now 2 mg daily and Zetia . Last LDL 104. Discussed alternative lipid lowering medication. He is more receptive to this now and is leaning towards Leqvio. Would need up to date labs. He is going to discuss this with Dr  Shepard. Focus on lifestyle modification.  4. Chronotropic incompetence. Noted on prior stress test and beta blocker was reduced.    5. History of sleep apnea.   6. Shingles. Persistent neuropathic pain.  Follow up in 6 months.

## 2024-02-16 ENCOUNTER — Other Ambulatory Visit: Payer: Self-pay | Admitting: Cardiology

## 2024-02-24 ENCOUNTER — Ambulatory Visit: Attending: Cardiology | Admitting: Cardiology

## 2024-02-24 ENCOUNTER — Encounter: Payer: Self-pay | Admitting: Cardiology

## 2024-02-24 VITALS — BP 120/52 | HR 52 | Ht 73.0 in | Wt 209.4 lb

## 2024-02-24 DIAGNOSIS — I10 Essential (primary) hypertension: Secondary | ICD-10-CM

## 2024-02-24 DIAGNOSIS — E7800 Pure hypercholesterolemia, unspecified: Secondary | ICD-10-CM | POA: Diagnosis not present

## 2024-02-24 DIAGNOSIS — I2510 Atherosclerotic heart disease of native coronary artery without angina pectoris: Secondary | ICD-10-CM | POA: Diagnosis not present

## 2024-02-24 NOTE — Patient Instructions (Signed)
 Medication Instructions:  Continue same medications *If you need a refill on your cardiac medications before your next appointment, please call your pharmacy*  Lab Work: None ordered  Testing/Procedures: None ordered  Follow-Up: At [facility], you and your health needs are our priority.  As part of our continuing mission to provide you with exceptional heart care, our providers are all part of one team.  This team includes your primary Cardiologist (physician) and Advanced Practice Providers or APPs (Physician Assistants and Nurse Practitioners) who all work together to provide you with the care you need, when you need it.  Your next appointment:  6 months   Call in April to schedule June appointment     Provider:  Dr.Jordan    We recommend signing up for the patient portal called MyChart.  Sign up information is provided on this After Visit Summary.  MyChart is used to connect with patients for Virtual Visits (Telemedicine).  Patients are able to view lab/test results, encounter notes, upcoming appointments, etc.  Non-urgent messages can be sent to your provider as well.   To learn more about what you can do with MyChart, go to [email or web address].

## 2024-03-02 ENCOUNTER — Other Ambulatory Visit: Payer: Self-pay | Admitting: Cardiology

## 2024-03-02 DIAGNOSIS — I2510 Atherosclerotic heart disease of native coronary artery without angina pectoris: Secondary | ICD-10-CM

## 2024-03-23 NOTE — Progress Notes (Signed)
 [name]                                          MRN: [id number]   03/23/2024   The [facility] Quality Team Specialist reviewed this patient medical record for the purposes of chart review for care gap closure. The following were reviewed: chart review for care gap closure-controlling blood pressure.    [facility] Quality Team

## 2024-03-28 ENCOUNTER — Encounter: Payer: Self-pay | Admitting: Internal Medicine

## 2024-05-04 ENCOUNTER — Ambulatory Visit: Admitting: Internal Medicine

## 2024-05-12 ENCOUNTER — Ambulatory Visit: Admitting: Adult Health

## 2024-09-08 ENCOUNTER — Ambulatory Visit: Admitting: Adult Health
